# Patient Record
Sex: Female | Born: 1950
Health system: Southern US, Community
[De-identification: ages and names within clinical notes are randomized; demographics above are authoritative.]

## PROBLEM LIST (undated history)

## (undated) DIAGNOSIS — E785 Hyperlipidemia, unspecified: Secondary | ICD-10-CM

## (undated) DIAGNOSIS — M5412 Radiculopathy, cervical region: Secondary | ICD-10-CM

## (undated) DIAGNOSIS — M159 Polyosteoarthritis, unspecified: Secondary | ICD-10-CM

## (undated) DIAGNOSIS — J189 Pneumonia, unspecified organism: Secondary | ICD-10-CM

## (undated) DIAGNOSIS — R7303 Prediabetes: Secondary | ICD-10-CM

## (undated) DIAGNOSIS — C50919 Malignant neoplasm of unspecified site of unspecified female breast: Secondary | ICD-10-CM

## (undated) DIAGNOSIS — M7712 Lateral epicondylitis, left elbow: Secondary | ICD-10-CM

## (undated) DIAGNOSIS — M542 Cervicalgia: Secondary | ICD-10-CM

## (undated) DIAGNOSIS — M25562 Pain in left knee: Secondary | ICD-10-CM

## (undated) DIAGNOSIS — C55 Malignant neoplasm of uterus, part unspecified: Secondary | ICD-10-CM

## (undated) DIAGNOSIS — I10 Essential (primary) hypertension: Secondary | ICD-10-CM

## (undated) DIAGNOSIS — Z780 Asymptomatic menopausal state: Secondary | ICD-10-CM

## (undated) DIAGNOSIS — M5430 Sciatica, unspecified side: Secondary | ICD-10-CM

## (undated) DIAGNOSIS — Z809 Family history of malignant neoplasm, unspecified: Secondary | ICD-10-CM

## (undated) DIAGNOSIS — R5383 Other fatigue: Secondary | ICD-10-CM

## (undated) DIAGNOSIS — Z801 Family history of malignant neoplasm of trachea, bronchus and lung: Secondary | ICD-10-CM

## (undated) DIAGNOSIS — M25462 Effusion, left knee: Secondary | ICD-10-CM

## (undated) HISTORY — DX: Family history of malignant neoplasm, unspecified: Z80.9

## (undated) HISTORY — DX: Pain in left knee: M25.562

## (undated) HISTORY — DX: Essential (primary) hypertension: I10

## (undated) HISTORY — DX: Lateral epicondylitis, left elbow: M77.12

## (undated) HISTORY — DX: Polyosteoarthritis, unspecified: M15.9

## (undated) HISTORY — DX: Cervicalgia: M54.2

## (undated) HISTORY — PX: APPENDECTOMY: SHX54

## (undated) HISTORY — DX: Other fatigue: R53.83

## (undated) HISTORY — DX: Sciatica, unspecified side: M54.30

## (undated) HISTORY — DX: Malignant neoplasm of unspecified site of unspecified female breast: C50.919

## (undated) HISTORY — DX: Asymptomatic menopausal state: Z78.0

## (undated) HISTORY — DX: Pneumonia, unspecified organism: J18.9

## (undated) HISTORY — DX: Effusion, left knee: M25.462

## (undated) HISTORY — DX: Prediabetes: R73.03

## (undated) HISTORY — DX: Family history of malignant neoplasm of trachea, bronchus and lung: Z80.1

## (undated) HISTORY — DX: Radiculopathy, cervical region: M54.12

## (undated) HISTORY — DX: Hyperlipidemia, unspecified: E78.5

## (undated) HISTORY — DX: Malignant neoplasm of uterus, part unspecified: C55

---

## 1981-10-19 DIAGNOSIS — C569 Malignant neoplasm of unspecified ovary: Secondary | ICD-10-CM

## 1981-10-19 HISTORY — PX: ABDOMINAL HYSTERECTOMY: SHX81

## 1981-10-19 HISTORY — PX: VAGINAL HYSTERECTOMY: SUR661

## 1981-10-19 HISTORY — DX: Malignant neoplasm of unspecified ovary: C56.9

## 2015-02-25 LAB — HM PAP SMEAR: HM PAP: NEGATIVE

## 2015-04-04 LAB — BASIC METABOLIC PANEL
BUN: 16 (ref 4–21)
CREATININE: 0.8 (ref 0.5–1.1)
GLUCOSE: 85
Potassium: 4.3 (ref 3.4–5.3)
Sodium: 140 (ref 137–147)

## 2015-04-04 LAB — HEPATIC FUNCTION PANEL
ALT: 12 (ref 7–35)
AST: 15 (ref 13–35)
Alkaline Phosphatase: 66 (ref 25–125)
Bilirubin, Total: 0.4

## 2015-04-04 LAB — HM DEXA SCAN

## 2015-04-04 LAB — TSH: TSH: 1.21 (ref 0.41–5.90)

## 2015-04-04 LAB — CBC AND DIFFERENTIAL
HCT: 38 (ref 36–46)
Hemoglobin: 12.6 (ref 12.0–16.0)
PLATELETS: 259 (ref 150–399)
WBC: 5.6

## 2015-04-25 LAB — HM MAMMOGRAPHY

## 2015-04-26 ENCOUNTER — Other Ambulatory Visit: Payer: Self-pay | Admitting: Obstetrics & Gynecology

## 2015-04-26 DIAGNOSIS — R928 Other abnormal and inconclusive findings on diagnostic imaging of breast: Secondary | ICD-10-CM

## 2015-05-02 ENCOUNTER — Ambulatory Visit
Admission: RE | Admit: 2015-05-02 | Discharge: 2015-05-02 | Disposition: A | Discharge status: Home / Self Care | Payer: Medicare Other | Source: Ambulatory Visit | Attending: Obstetrics & Gynecology | Admitting: Obstetrics & Gynecology

## 2015-05-02 DIAGNOSIS — R928 Other abnormal and inconclusive findings on diagnostic imaging of breast: Secondary | ICD-10-CM

## 2015-05-02 LAB — HM MAMMOGRAPHY

## 2016-01-14 ENCOUNTER — Encounter: Payer: Self-pay | Admitting: Nurse Practitioner

## 2016-01-14 DIAGNOSIS — M25462 Effusion, left knee: Secondary | ICD-10-CM | POA: Diagnosis not present

## 2016-01-14 DIAGNOSIS — M7989 Other specified soft tissue disorders: Secondary | ICD-10-CM | POA: Diagnosis not present

## 2016-01-14 DIAGNOSIS — S8992XA Unspecified injury of left lower leg, initial encounter: Secondary | ICD-10-CM | POA: Diagnosis not present

## 2016-01-30 DIAGNOSIS — R5383 Other fatigue: Secondary | ICD-10-CM | POA: Diagnosis not present

## 2016-01-30 DIAGNOSIS — M25462 Effusion, left knee: Secondary | ICD-10-CM | POA: Diagnosis not present

## 2016-03-19 DIAGNOSIS — M25462 Effusion, left knee: Secondary | ICD-10-CM | POA: Diagnosis not present

## 2016-03-19 DIAGNOSIS — M25562 Pain in left knee: Secondary | ICD-10-CM | POA: Diagnosis not present

## 2016-09-16 DIAGNOSIS — M159 Polyosteoarthritis, unspecified: Secondary | ICD-10-CM | POA: Diagnosis not present

## 2016-09-16 DIAGNOSIS — Z780 Asymptomatic menopausal state: Secondary | ICD-10-CM | POA: Diagnosis not present

## 2016-09-16 DIAGNOSIS — M542 Cervicalgia: Secondary | ICD-10-CM | POA: Diagnosis not present

## 2016-09-16 DIAGNOSIS — R5383 Other fatigue: Secondary | ICD-10-CM | POA: Diagnosis not present

## 2016-10-14 DIAGNOSIS — R5383 Other fatigue: Secondary | ICD-10-CM | POA: Diagnosis not present

## 2016-10-14 DIAGNOSIS — M159 Polyosteoarthritis, unspecified: Secondary | ICD-10-CM | POA: Diagnosis not present

## 2016-10-14 LAB — MICROALBUMIN, URINE: Microalb, Ur: 6.4

## 2016-10-14 LAB — BASIC METABOLIC PANEL
BUN: 16 (ref 4–21)
Creatinine: 0.7 (ref 0.5–1.1)
GLUCOSE: 93
Potassium: 4.8 (ref 3.4–5.3)
Sodium: 141 (ref 137–147)

## 2016-10-14 LAB — HEPATIC FUNCTION PANEL
ALT: 18 (ref 7–35)
AST: 14 (ref 13–35)
Alkaline Phosphatase: 82 (ref 25–125)
BILIRUBIN, TOTAL: 0.2

## 2016-10-14 LAB — LIPID PANEL
CHOLESTEROL: 339 — AB (ref 0–200)
HDL: 55 (ref 35–70)
LDL Cholesterol: 244
TRIGLYCERIDES: 201 — AB (ref 40–160)

## 2016-10-14 LAB — CBC AND DIFFERENTIAL
HEMATOCRIT: 38 (ref 36–46)
HEMOGLOBIN: 12.2 (ref 12.0–16.0)
PLATELETS: 311 (ref 150–399)
WBC: 7.6

## 2016-10-14 LAB — TSH: TSH: 1.34 (ref 0.41–5.90)

## 2016-10-21 ENCOUNTER — Encounter: Payer: Self-pay | Admitting: Nurse Practitioner

## 2016-10-21 DIAGNOSIS — E782 Mixed hyperlipidemia: Secondary | ICD-10-CM | POA: Diagnosis not present

## 2016-10-21 DIAGNOSIS — M5412 Radiculopathy, cervical region: Secondary | ICD-10-CM | POA: Diagnosis not present

## 2016-10-21 DIAGNOSIS — M542 Cervicalgia: Secondary | ICD-10-CM | POA: Diagnosis not present

## 2016-10-21 DIAGNOSIS — R7303 Prediabetes: Secondary | ICD-10-CM | POA: Diagnosis not present

## 2016-11-18 DIAGNOSIS — H524 Presbyopia: Secondary | ICD-10-CM | POA: Diagnosis not present

## 2016-11-18 DIAGNOSIS — Z0100 Encounter for examination of eyes and vision without abnormal findings: Secondary | ICD-10-CM | POA: Diagnosis not present

## 2017-01-19 ENCOUNTER — Encounter: Payer: Self-pay | Admitting: Nurse Practitioner

## 2017-01-19 DIAGNOSIS — E782 Mixed hyperlipidemia: Secondary | ICD-10-CM | POA: Diagnosis not present

## 2017-01-19 DIAGNOSIS — R7303 Prediabetes: Secondary | ICD-10-CM | POA: Diagnosis not present

## 2017-01-19 LAB — HEPATIC FUNCTION PANEL
ALK PHOS: 72 (ref 25–125)
ALT: 17 (ref 7–35)
ALT: 17 (ref 7–35)
AST: 16 (ref 13–35)
AST: 16 (ref 13–35)
Alkaline Phosphatase: 72 (ref 25–125)
Bilirubin, Total: 0.4
Bilirubin, Total: 0.4

## 2017-01-19 LAB — LIPID PANEL
CHOLESTEROL: 307 — AB (ref 0–200)
CHOLESTEROL: 307 — AB (ref 0–200)
HDL: 52 (ref 35–70)
HDL: 52 (ref 35–70)
LDL CALC: 217
LDL Cholesterol: 217
TRIGLYCERIDES: 189 — AB (ref 40–160)
TRIGLYCERIDES: 189 — AB (ref 40–160)

## 2017-01-19 LAB — BASIC METABOLIC PANEL
BUN: 17 (ref 4–21)
BUN: 17 (ref 4–21)
CREATININE: 0.8 (ref 0.5–1.1)
Creatinine: 0.8 (ref 0.5–1.1)
Glucose: 102
Glucose: 102
POTASSIUM: 4.3 (ref 3.4–5.3)
Potassium: 4.3 (ref 3.4–5.3)
SODIUM: 141 (ref 137–147)
Sodium: 141 (ref 137–147)

## 2017-01-19 LAB — HEMOGLOBIN A1C
HEMOGLOBIN A1C: 6.6
HEMOGLOBIN A1C: 6.6

## 2017-06-28 DIAGNOSIS — R7303 Prediabetes: Secondary | ICD-10-CM | POA: Diagnosis not present

## 2017-06-28 DIAGNOSIS — M7712 Lateral epicondylitis, left elbow: Secondary | ICD-10-CM

## 2017-06-28 DIAGNOSIS — E782 Mixed hyperlipidemia: Secondary | ICD-10-CM | POA: Diagnosis not present

## 2017-06-28 HISTORY — DX: Lateral epicondylitis, left elbow: M77.12

## 2017-09-13 DIAGNOSIS — J069 Acute upper respiratory infection, unspecified: Secondary | ICD-10-CM | POA: Diagnosis not present

## 2017-09-15 DIAGNOSIS — J36 Peritonsillar abscess: Secondary | ICD-10-CM | POA: Diagnosis not present

## 2017-09-15 HISTORY — PX: ABCESS DRAINAGE: SHX399

## 2017-09-16 DIAGNOSIS — J36 Peritonsillar abscess: Secondary | ICD-10-CM | POA: Diagnosis not present

## 2017-12-20 ENCOUNTER — Ambulatory Visit: Payer: Self-pay | Admitting: Nurse Practitioner

## 2017-12-27 ENCOUNTER — Ambulatory Visit: Payer: Medicare HMO | Admitting: Nurse Practitioner

## 2017-12-28 ENCOUNTER — Encounter: Payer: Self-pay | Admitting: Nurse Practitioner

## 2017-12-28 ENCOUNTER — Ambulatory Visit (INDEPENDENT_AMBULATORY_CARE_PROVIDER_SITE_OTHER): Payer: Medicare HMO | Admitting: Nurse Practitioner

## 2017-12-28 VITALS — BP 126/84 | HR 76 | Temp 98.5°F | Ht 67.0 in | Wt 139.4 lb

## 2017-12-28 DIAGNOSIS — R739 Hyperglycemia, unspecified: Secondary | ICD-10-CM

## 2017-12-28 DIAGNOSIS — J309 Allergic rhinitis, unspecified: Secondary | ICD-10-CM

## 2017-12-28 DIAGNOSIS — M8580 Other specified disorders of bone density and structure, unspecified site: Secondary | ICD-10-CM | POA: Diagnosis not present

## 2017-12-28 DIAGNOSIS — M159 Polyosteoarthritis, unspecified: Secondary | ICD-10-CM | POA: Diagnosis not present

## 2017-12-28 DIAGNOSIS — E782 Mixed hyperlipidemia: Secondary | ICD-10-CM

## 2017-12-28 MED ORDER — TETANUS-DIPHTH-ACELL PERTUSSIS 5-2.5-18.5 LF-MCG/0.5 IM SUSP: 0.5000 mL | Freq: Once | INTRAMUSCULAR | 0 refills | Status: AC
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
## 2017-12-28 NOTE — Patient Instructions (Signed)
4-6 weeks for AWV with fasting blood work then physical

## 2017-12-30 ENCOUNTER — Ambulatory Visit (INDEPENDENT_AMBULATORY_CARE_PROVIDER_SITE_OTHER): Payer: Medicare HMO | Admitting: Nurse Practitioner

## 2017-12-30 ENCOUNTER — Encounter: Payer: Self-pay | Admitting: Nurse Practitioner

## 2017-12-30 VITALS — BP 118/78 | HR 80 | Temp 98.7°F | Ht 67.0 in | Wt 138.0 lb

## 2017-12-30 DIAGNOSIS — J0140 Acute pansinusitis, unspecified: Secondary | ICD-10-CM | POA: Diagnosis not present

## 2017-12-30 DIAGNOSIS — E782 Mixed hyperlipidemia: Secondary | ICD-10-CM | POA: Insufficient documentation

## 2017-12-30 DIAGNOSIS — M159 Polyosteoarthritis, unspecified: Secondary | ICD-10-CM | POA: Insufficient documentation

## 2017-12-30 DIAGNOSIS — J309 Allergic rhinitis, unspecified: Secondary | ICD-10-CM | POA: Insufficient documentation

## 2017-12-30 DIAGNOSIS — R739 Hyperglycemia, unspecified: Secondary | ICD-10-CM | POA: Insufficient documentation

## 2017-12-30 DIAGNOSIS — M8580 Other specified disorders of bone density and structure, unspecified site: Secondary | ICD-10-CM | POA: Insufficient documentation

## 2017-12-30 MED ORDER — FLUTICASONE PROPIONATE 50 MCG/ACT NA SUSP: 1.0000 | Freq: Two times a day (BID) | NASAL | 0 refills | Status: DC

## 2017-12-30 NOTE — Patient Instructions (Signed)
neti pot twice daily flonase 1 spray into nares twice daily  Plain nasal saline spray throughout the day as needed May use tylenol 325 mg 2 tablets every 6 hours as needed aches and pains or sore throat humidifier in the home to help with the dry air Mucinex DM by mouth twice daily as needed for cough and congestion with full glass of water  Keep well hydrated Avoid forcefully blowing nose     Sinusitis, Adult Sinusitis is soreness and inflammation of your sinuses. Sinuses are hollow spaces in the bones around your face. Your sinuses are located:  Around your eyes.  In the middle of your forehead.  Behind your nose.  In your cheekbones.  Your sinuses and nasal passages are lined with a stringy fluid (mucus). Mucus normally drains out of your sinuses. When your nasal tissues become inflamed or swollen, the mucus can become trapped or blocked so air cannot flow through your sinuses. This allows bacteria, viruses, and funguses to grow, which leads to infection. Sinusitis can develop quickly and last for 7?10 days (acute) or for more than 12 weeks (chronic). Sinusitis often develops after a cold. What are the causes? This condition is caused by anything that creates swelling in the sinuses or stops mucus from draining, including:  Allergies.  Asthma.  Bacterial or viral infection.  Abnormally shaped bones between the nasal passages.  Nasal growths that contain mucus (nasal polyps).  Narrow sinus openings.  Pollutants, such as chemicals or irritants in the air.  A foreign object stuck in the nose.  A fungal infection. This is rare.  What increases the risk? The following factors may make you more likely to develop this condition:  Having allergies or asthma.  Having had a recent cold or respiratory tract infection.  Having structural deformities or blockages in your nose or sinuses.  Having a weak immune system.  Doing a lot of swimming or diving.  Overusing  nasal sprays.  Smoking.  What are the signs or symptoms? The main symptoms of this condition are pain and a feeling of pressure around the affected sinuses. Other symptoms include:  Upper toothache.  Earache.  Headache.  Bad breath.  Decreased sense of smell and taste.  A cough that may get worse at night.  Fatigue.  Fever.  Thick drainage from your nose. The drainage is often green and it may contain pus (purulent).  Stuffy nose or congestion.  Postnasal drip. This is when extra mucus collects in the throat or back of the nose.  Swelling and warmth over the affected sinuses.  Sore throat.  Sensitivity to light.  How is this diagnosed? This condition is diagnosed based on symptoms, a medical history, and a physical exam. To find out if your condition is acute or chronic, your health care provider may:  Look in your nose for signs of nasal polyps.  Tap over the affected sinus to check for signs of infection.  View the inside of your sinuses using an imaging device that has a light attached (endoscope).  If your health care provider suspects that you have chronic sinusitis, you may also:  Be tested for allergies.  Have a sample of mucus taken from your nose (nasal culture) and checked for bacteria.  Have a mucus sample examined to see if your sinusitis is related to an allergy.  If your sinusitis does not respond to treatment and it lasts longer than 8 weeks, you may have an MRI or CT scan to check  your sinuses. These scans also help to determine how severe your infection is. In rare cases, a bone biopsy may be done to rule out more serious types of fungal sinus disease. How is this treated? Treatment for sinusitis depends on the cause and whether your condition is chronic or acute. If a virus is causing your sinusitis, your symptoms will go away on their own within 10 days. You may be given medicines to relieve your symptoms, including:  Topical nasal  decongestants. They shrink swollen nasal passages and let mucus drain from your sinuses.  Antihistamines. These drugs block inflammation that is triggered by allergies. This can help to ease swelling in your nose and sinuses.  Topical nasal corticosteroids. These are nasal sprays that ease inflammation and swelling in your nose and sinuses.  Nasal saline washes. These rinses can help to get rid of thick mucus in your nose.  If your condition is caused by bacteria, you will be given an antibiotic medicine. If your condition is caused by a fungus, you will be given an antifungal medicine. Surgery may be needed to correct underlying conditions, such as narrow nasal passages. Surgery may also be needed to remove polyps. Follow these instructions at home: Medicines  Take, use, or apply over-the-counter and prescription medicines only as told by your health care provider. These may include nasal sprays.  If you were prescribed an antibiotic medicine, take it as told by your health care provider. Do not stop taking the antibiotic even if you start to feel better. Hydrate and Humidify  Drink enough water to keep your urine clear or pale yellow. Staying hydrated will help to thin your mucus.  Use a cool mist humidifier to keep the humidity level in your home above 50%.  Inhale steam for 10-15 minutes, 3-4 times a day or as told by your health care provider. You can do this in the bathroom while a hot shower is running.  Limit your exposure to cool or dry air. Rest  Rest as much as possible.  Sleep with your head raised (elevated).  Make sure to get enough sleep each night. General instructions  Apply a warm, moist washcloth to your face 3-4 times a day or as told by your health care provider. This will help with discomfort.  Wash your hands often with soap and water to reduce your exposure to viruses and other germs. If soap and water are not available, use hand sanitizer.  Do not smoke.  Avoid being around people who are smoking (secondhand smoke).  Keep all follow-up visits as told by your health care provider. This is important. Contact a health care provider if:  You have a fever.  Your symptoms get worse.  Your symptoms do not improve within 10 days. Get help right away if:  You have a severe headache.  You have persistent vomiting.  You have pain or swelling around your face or eyes.  You have vision problems.  You develop confusion.  Your neck is stiff.  You have trouble breathing. This information is not intended to replace advice given to you by your health care provider. Make sure you discuss any questions you have with your health care provider. Document Released: 10/05/2005 Document Revised: 05/31/2016 Document Reviewed: 07/31/2015 Elsevier Interactive Patient Education  [employer].

## 2017-12-30 NOTE — Progress Notes (Signed)
Careteam: Patient Care Team: [name], NP as PCP - General (Geriatric Medicine)  Advanced Directive information    No Known Allergies  Chief Complaint  Patient presents with  . Acute Visit    Pt is being seen for sinus pressure/pain/drainage and a cough for 2 days.      HPI: Patient is a 67 y.o. female seen in the office today due to cough and congestion for 2 days. Sinuses are swollen lots of dripping from her nose.  More in her head then anywhere else. Lots of head congestion -no fever or chills, just feeling sick Occasional cough.  Mild sore throat.  Using alka-seltzer cold plus and drank tea   Review of Systems:  Review of Systems  Constitutional: Positive for malaise/fatigue. Negative for chills and fever.  HENT: Positive for congestion, sinus pain and sore throat. Negative for ear discharge and ear pain.   Respiratory: Positive for cough. Negative for sputum production and wheezing.   Cardiovascular: Negative for chest pain.  Neurological: Negative for weakness.    Past Medical History:  Diagnosis Date  . Fatigue   . Generalized osteoarthrosis   . Hyperlipidemia   . Menopause   . Pre-diabetes    Past Surgical History:  Procedure Laterality Date  . VAGINAL HYSTERECTOMY  1983   Social History:   reports that she has been smoking cigarettes.  She has a 25.00 pack-year smoking history. she has never used smokeless tobacco. She reports that she does not drink alcohol or use drugs.  Family History  Problem Relation Age of Onset  . Stroke Mother   . Diabetes Mother   . Dementia Mother   . Cancer Sister        unknown  . Cancer Sister        unknown  . Kidney failure Brother   . Cancer Brother   . Cancer Brother     Medications: Patient's Medications  New Prescriptions   No medications on file  Previous Medications   CALCIUM CITRATE-VITAMIN D (CALCIUM CITRATE + D PO)    Take 1 tablet by mouth daily.   DIGESTIVE ENZYMES (SUPER ENZYMES)  TABS    Take 1 tablet by mouth daily.   DIPHENHYDRAMINE (BENADRYL) 25 MG TABLET    Take 25 mg by mouth daily.   IBUPROFEN (ADVIL,MOTRIN) 200 MG TABLET    Take 200 mg by mouth daily as needed.   VITAMIN E 400 UNIT CAPSULE    Take 400 Units by mouth 2 (two) times daily.  Modified Medications   No medications on file  Discontinued Medications   No medications on file     Physical Exam:  Vitals:   12/30/17 0947  BP: 118/78  Pulse: 80  Temp: 98.7 F (37.1 C)  TempSrc: Oral  SpO2: 99%  Weight: 138 lb (62.6 kg)  Height: 5\' 7"  (1.702 m)   Body mass index is 21.61 kg/m.  Physical Exam  Constitutional: She is oriented to person, place, and time. She appears well-developed and well-nourished.  HENT:  Head: Normocephalic and atraumatic.  Right Ear: External ear normal.  Left Ear: External ear normal.  Nose: Mucosal edema and rhinorrhea present. Right sinus exhibits maxillary sinus tenderness and frontal sinus tenderness.  Mouth/Throat: Oropharynx is clear and moist.  Cardiovascular: Normal rate and regular rhythm.  Pulmonary/Chest: Effort normal and breath sounds normal.  Neurological: She is alert and oriented to person, place, and time.  Skin: Skin is warm and dry.  Labs reviewed: Basic Metabolic Panel: Recent Labs    01/19/17  NA 141  K 4.3  BUN 17  CREATININE 0.8   Liver Function Tests: Recent Labs    01/19/17  AST 16  ALT 17  ALKPHOS 72   No results for input(s): LIPASE, AMYLASE in the last 8760 hours. No results for input(s): AMMONIA in the last 8760 hours. CBC: No results for input(s): WBC, NEUTROABS, HGB, HCT, MCV, PLT in the last 8760 hours. Lipid Panel: Recent Labs    01/19/17  CHOL 307*  HDL 52  LDLCALC 217  TRIG 189*   TSH: No results for input(s): TSH in the last 8760 hours. A1C: Lab Results  Component Value Date   HGBA1C 6.6 01/19/2017     Assessment/Plan 1. Acute non-recurrent pansinusitis -supportive care encouraged.  neti pot  twice daily Plain nasal saline spray throughout the day as needed May use tylenol 325 mg 2 tablets every 6 hours as needed aches and pains or sore throat humidifier in the home to help with the dry air Mucinex DM by mouth twice daily as needed for cough and congestion with full glass of water  Keep well hydrated Avoid forcefully blowing nose - fluticasone (FLONASE) 50 MCG/ACT nasal spray; Place 1 spray into both nostrils 2 (two) times daily.  Dispense: 16 g; Refill: 0  Next appt:  Otilia Kareem K. [place], [facility] [phone]

## 2018-01-05 ENCOUNTER — Ambulatory Visit (INDEPENDENT_AMBULATORY_CARE_PROVIDER_SITE_OTHER): Payer: Medicare HMO

## 2018-01-05 VITALS — BP 128/70 | HR 76 | Temp 98.6°F | Ht 67.0 in | Wt 139.0 lb

## 2018-01-05 DIAGNOSIS — E782 Mixed hyperlipidemia: Secondary | ICD-10-CM

## 2018-01-05 DIAGNOSIS — E2839 Other primary ovarian failure: Secondary | ICD-10-CM | POA: Diagnosis not present

## 2018-01-05 DIAGNOSIS — Z0000 Encounter for general adult medical examination without abnormal findings: Secondary | ICD-10-CM | POA: Diagnosis not present

## 2018-01-05 DIAGNOSIS — M159 Polyosteoarthritis, unspecified: Secondary | ICD-10-CM | POA: Diagnosis not present

## 2018-01-05 DIAGNOSIS — Z1231 Encounter for screening mammogram for malignant neoplasm of breast: Secondary | ICD-10-CM

## 2018-01-05 DIAGNOSIS — R739 Hyperglycemia, unspecified: Secondary | ICD-10-CM | POA: Diagnosis not present

## 2018-01-05 DIAGNOSIS — Z1239 Encounter for other screening for malignant neoplasm of breast: Secondary | ICD-10-CM

## 2018-01-05 NOTE — Patient Instructions (Signed)
Emily Hayes , Thank you for taking time to come for your Medicare Wellness Visit. I appreciate your ongoing commitment to your health goals. Please review the following plan we discussed and let me know if I can assist you in the future.   Screening recommendations/referrals: Colonoscopy up to date, due 10/19/2022 Mammogram due, ordered Bone Density due, ordered Recommended yearly ophthalmology/optometry visit for glaucoma screening and checkup Recommended yearly dental visit for hygiene and checkup  Vaccinations: Influenza vaccine due, declined Pneumococcal vaccine due, declined Tdap vaccine due, you have the prescription  Shingles vaccine up to date, completed    Advanced directives: Advance directive discussed with you today. I have provided a copy for you to complete at home and have notarized. Once this is complete please bring a copy in to our office so we can scan it into your chart.  Conditions/risks identified: none  Next appointment: Emily Mustache, NP 01/12/2018 @ 1:30pm             Emily Dense, RN 01/09/2019 @ 8:30am     Preventive Care 65 Years and Older, Female Preventive care refers to lifestyle choices and visits with your health care provider that can promote health and wellness. What does preventive care include?  A yearly physical exam. This is also called an annual well check.  Dental exams once or twice a year.  Routine eye exams. Ask your health care provider how often you should have your eyes checked.  Personal lifestyle choices, including:  Daily care of your teeth and gums.  Regular physical activity.  Eating a healthy diet.  Avoiding tobacco and drug use.  Limiting alcohol use.  Practicing safe sex.  Taking low-dose aspirin every day.  Taking vitamin and mineral supplements as recommended by your health care provider. What happens during an annual well check? The services and screenings done by your health care provider during your annual  well check will depend on your age, overall health, lifestyle risk factors, and family history of disease. Counseling  Your health care provider may ask you questions about your:  Alcohol use.  Tobacco use.  Drug use.  Emotional well-being.  Home and relationship well-being.  Sexual activity.  Eating habits.  History of falls.  Memory and ability to understand (cognition).  Work and work Statistician.  Reproductive health. Screening  You may have the following tests or measurements:  Height, weight, and BMI.  Blood pressure.  Lipid and cholesterol levels. These may be checked every 5 years, or more frequently if you are over 36 years old.  Skin check.  Lung cancer screening. You may have this screening every year starting at age 88 if you have a 30-pack-year history of smoking and currently smoke or have quit within the past 15 years.  Fecal occult blood test (FOBT) of the stool. You may have this test every year starting at age 68.  Flexible sigmoidoscopy or colonoscopy. You may have a sigmoidoscopy every 5 years or a colonoscopy every 10 years starting at age 76.  Hepatitis C blood test.  Hepatitis B blood test.  Sexually transmitted disease (STD) testing.  Diabetes screening. This is done by checking your blood sugar (glucose) after you have not eaten for a while (fasting). You may have this done every 1-3 years.  Bone density scan. This is done to screen for osteoporosis. You may have this done starting at age 90.  Mammogram. This may be done every 1-2 years. Talk to your health care provider about how often  you should have regular mammograms. Talk with your health care provider about your test results, treatment options, and if necessary, the need for more tests. Vaccines  Your health care provider may recommend certain vaccines, such as:  Influenza vaccine. This is recommended every year.  Tetanus, diphtheria, and acellular pertussis (Tdap, Td) vaccine.  You may need a Td booster every 10 years.  Zoster vaccine. You may need this after age 33.  Pneumococcal 13-valent conjugate (PCV13) vaccine. One dose is recommended after age 13.  Pneumococcal polysaccharide (PPSV23) vaccine. One dose is recommended after age 61. Talk to your health care provider about which screenings and vaccines you need and how often you need them. This information is not intended to replace advice given to you by your health care provider. Make sure you discuss any questions you have with your health care provider. Document Released: 11/01/2015 Document Revised: 06/24/2016 Document Reviewed: 08/06/2015 Elsevier Interactive Patient Education  2017 [employer] Prevention in the Home Falls can cause injuries. They can happen to people of all ages. There are many things you can do to make your home safe and to help prevent falls. What can I do on the outside of my home?  Regularly fix the edges of walkways and driveways and fix any cracks.  Remove anything that might make you trip as you walk through a door, such as a raised step or threshold.  Trim any bushes or trees on the path to your home.  Use bright outdoor lighting.  Clear any walking paths of anything that might make someone trip, such as rocks or tools.  Regularly check to see if handrails are loose or broken. Make sure that both sides of any steps have handrails.  Any raised decks and porches should have guardrails on the edges.  Have any leaves, snow, or ice cleared regularly.  Use sand or salt on walking paths during winter.  Clean up any spills in your garage right away. This includes oil or grease spills. What can I do in the bathroom?  Use night lights.  Install grab bars by the toilet and in the tub and shower. Do not use towel bars as grab bars.  Use non-skid mats or decals in the tub or shower.  If you need to sit down in the shower, use a plastic, non-slip stool.  Keep the  floor dry. Clean up any water that spills on the floor as soon as it happens.  Remove soap buildup in the tub or shower regularly.  Attach bath mats securely with double-sided non-slip rug tape.  Do not have throw rugs and other things on the floor that can make you trip. What can I do in the bedroom?  Use night lights.  Make sure that you have a light by your bed that is easy to reach.  Do not use any sheets or blankets that are too big for your bed. They should not hang down onto the floor.  Have a firm chair that has side arms. You can use this for support while you get dressed.  Do not have throw rugs and other things on the floor that can make you trip. What can I do in the kitchen?  Clean up any spills right away.  Avoid walking on wet floors.  Keep items that you use a lot in easy-to-reach places.  If you need to reach something above you, use a strong step stool that has a grab bar.  Keep electrical cords  out of the way.  Do not use floor polish or wax that makes floors slippery. If you must use wax, use non-skid floor wax.  Do not have throw rugs and other things on the floor that can make you trip. What can I do with my stairs?  Do not leave any items on the stairs.  Make sure that there are handrails on both sides of the stairs and use them. Fix handrails that are broken or loose. Make sure that handrails are as long as the stairways.  Check any carpeting to make sure that it is firmly attached to the stairs. Fix any carpet that is loose or worn.  Avoid having throw rugs at the top or bottom of the stairs. If you do have throw rugs, attach them to the floor with carpet tape.  Make sure that you have a light switch at the top of the stairs and the bottom of the stairs. If you do not have them, ask someone to add them for you. What else can I do to help prevent falls?  Wear shoes that:  Do not have high heels.  Have rubber bottoms.  Are comfortable and fit  you well.  Are closed at the toe. Do not wear sandals.  If you use a stepladder:  Make sure that it is fully opened. Do not climb a closed stepladder.  Make sure that both sides of the stepladder are locked into place.  Ask someone to hold it for you, if possible.  Clearly mark and make sure that you can see:  Any grab bars or handrails.  First and last steps.  Where the edge of each step is.  Use tools that help you move around (mobility aids) if they are needed. These include:  Canes.  Walkers.  Scooters.  Crutches.  Turn on the lights when you go into a dark area. Replace any light bulbs as soon as they burn out.  Set up your furniture so you have a clear path. Avoid moving your furniture around.  If any of your floors are uneven, fix them.  If there are any pets around you, be aware of where they are.  Review your medicines with your doctor. Some medicines can make you feel dizzy. This can increase your chance of falling. Ask your doctor what other things that you can do to help prevent falls. This information is not intended to replace advice given to you by your health care provider. Make sure you discuss any questions you have with your health care provider. Document Released: 08/01/2009 Document Revised: 03/12/2016 Document Reviewed: 11/09/2014 Elsevier Interactive Patient Education  2017 [employer].

## 2018-01-05 NOTE — Progress Notes (Signed)
Subjective:   Emily Hayes is a 67 y.o. female who presents for an Initial Medicare Annual Wellness Visit.    Objective:    Today's Vitals   01/05/18 0830  BP: 128/70  Pulse: 76  Temp: 98.6 F (37 C)  TempSrc: Oral  SpO2: 97%  Weight: 139 lb (63 kg)  Height: 5\' 7"  (1.702 m)   Body mass index is 21.77 kg/m.  Advanced Directives 01/05/2018  Does Patient Have a Medical Advance Directive? No  Would patient like information on creating a medical advance directive? Yes (MAU/Ambulatory/Procedural Areas - Information given)    Current Medications (verified) Outpatient Encounter Medications as of 01/05/2018  Medication Sig  . Calcium Citrate-Vitamin D (CALCIUM CITRATE + D PO) Take 1 tablet by mouth daily.  . Digestive Enzymes (SUPER ENZYMES) TABS Take 1 tablet by mouth daily.  . diphenhydrAMINE (BENADRYL) 25 MG tablet Take 25 mg by mouth daily.  . fluticasone (FLONASE) 50 MCG/ACT nasal spray Place 1 spray into both nostrils 2 (two) times daily.  Marland Kitchen ibuprofen (ADVIL,MOTRIN) 200 MG tablet Take 200 mg by mouth daily as needed.  . vitamin E 400 UNIT capsule Take 400 Units by mouth 2 (two) times daily.   No facility-administered encounter medications on file as of 01/05/2018.     Allergies (verified) Patient has no known allergies.   History: Past Medical History:  Diagnosis Date  . Fatigue   . Generalized osteoarthrosis   . Hyperlipidemia   . Menopause   . Pre-diabetes    Past Surgical History:  Procedure Laterality Date  . ABCESS DRAINAGE Left 09/15/2017   behind tonsil  . VAGINAL HYSTERECTOMY  1983   Family History  Problem Relation Age of Onset  . Stroke Mother   . Diabetes Mother   . Dementia Mother   . Cancer Sister        unknown  . Cancer Sister        unknown  . Kidney failure Brother   . Cancer Brother   . Cancer Brother    Social History   Socioeconomic History  . Marital status: Divorced    Spouse name: None  . Number of children: None  .  Years of education: None  . Highest education level: None  Social Needs  . Financial resource strain: Not hard at all  . Food insecurity - worry: Never true  . Food insecurity - inability: Never true  . Transportation needs - medical: No  . Transportation needs - non-medical: No  Occupational History  . None  Tobacco Use  . Smoking status: Current Every Day Smoker    Packs/day: 0.75    Years: 50.00    Pack years: 37.50    Types: Cigarettes  . Smokeless tobacco: Never Used  Substance and Sexual Activity  . Alcohol use: No    Frequency: Never  . Drug use: No  . Sexual activity: None  Other Topics Concern  . None  Social History Narrative   Social History      Diet? none      Do you drink/eat things with caffeine? Chocolate- occasionally      Marital status?        divorced                            What year were you married? 1982      Do you live in a house, apartment, assisted living, condo, trailer, etc.? apartment  Is it one or more stories? 1      How many persons live in your home? 1       Do you have any pets in your home? (please list) yes, dog      Highest level of education completed? Associate degree- college      Current or past profession: retired      Do you exercise?           yes                           Type & how often? Walk- daily      Advanced Directives      Do you have a living will? yes      Do you have a DNR form?     yes                             If not, do you want to discuss one? yes      Do you have signed POA/HPOA for forms? no      Functional Status      Do you have difficulty bathing or dressing yourself?      Do you have difficulty preparing food or eating?       Do you have difficulty managing your medications?      Do you have difficulty managing your finances?      Do you have difficulty affording your medications?    Tobacco Counseling Ready to quit: Not Answered Counseling given: Not  Answered   Clinical Intake:  Pre-visit preparation completed: No  Pain : No/denies pain     Nutritional Risks: None Diabetes: No  How often do you need to have someone help you when you read instructions, pamphlets, or other written materials from your doctor or pharmacy?: 1 - Never What is the last grade level you completed in school?: Associates  Interpreter Needed?: No  Information entered by :: [name], RN   Activities of Daily Living In your present state of health, do you have any difficulty performing the following activities: 01/05/2018  Hearing? N  Vision? N  Difficulty concentrating or making decisions? N  Walking or climbing stairs? N  Dressing or bathing? N  Doing errands, shopping? N  Preparing Food and eating ? N  Using the Toilet? N  In the past six months, have you accidently leaked urine? N  Do you have problems with loss of bowel control? N  Managing your Medications? N  Managing your Finances? N  Housekeeping or managing your Housekeeping? N  Some recent data might be hidden     Immunizations and Health Maintenance Immunization History  Administered Date(s) Administered  . Zoster Recombinat (Shingrix) 03/19/2017, 07/19/2017   Health Maintenance Due  Topic Date Due  . Hepatitis C Screening  09/06/1951  . TETANUS/TDAP  08/11/1968  . MAMMOGRAM  08/12/1999  . COLONOSCOPY  08/12/1999  . DEXA SCAN  08/11/2014    Patient Care Team: [name], NP as PCP - General (Geriatric Medicine)  Indicate any recent Medical Services you may have received from other than [facility] providers in the past year (date may be approximate).     Assessment:   This is a routine wellness examination for [name].  Hearing/Vision screen Hearing Screening Comments: Pt reports no issues with hearing Vision Screening Comments: Sees eye doctor annually  Dietary issues  and exercise activities discussed: Current Exercise Habits: Home exercise routine, Type of  exercise: walking, Time (Minutes): 20, Frequency (Times/Week): 3, Weekly Exercise (Minutes/Week): 60, Intensity: Mild, Exercise limited by: None identified  Goals    None     Depression Screen PHQ 2/9 Scores 01/05/2018 12/28/2017  PHQ - 2 Score 0 0    Fall Risk Fall Risk  01/05/2018 12/30/2017 12/28/2017  Falls in the past year? No No No    Is the patient's home free of loose throw rugs in walkways, pet beds, electrical cords, etc?   yes      Grab bars in the bathroom? no      Handrails on the stairs?   yes      Adequate lighting?   yes  Cognitive Function: MMSE - Mini Mental State Exam 01/05/2018  Orientation to time 5  Orientation to Place 5  Registration 3  Attention/ Calculation 5  Recall 3  Language- name 2 objects 2  Language- repeat 1  Language- follow 3 step command 3  Language- read & follow direction 1  Write a sentence 1  Copy design 1  Total score 30        Screening Tests Health Maintenance  Topic Date Due  . Hepatitis C Screening  1951-09-09  . TETANUS/TDAP  08/11/1968  . MAMMOGRAM  08/12/1999  . COLONOSCOPY  08/12/1999  . DEXA SCAN  08/11/2014  . INFLUENZA VACCINE  08/30/2018 (Originally 05/19/2017)  . PNA vac Low Risk Adult (1 of 2 - PCV13) 12/29/2018 (Originally 08/11/2014)    Qualifies for Shingles Vaccine? Up to date  Cancer Screenings: Lung: Low Dose CT Chest recommended if Age 95-80 years, 30 pack-year currently smoking OR have quit w/in 15years. Patient does not qualify. Breast: Up to date on Mammogram? No, ordered Up to date of Bone Density/Dexa? No, ordered Colorectal: up to date  Additional Screenings: Hepatitis C Screening: declined TDAP due- pt has prescription PNA vaccine due and declined Flu vaccine due and declined     Plan:    I have personally reviewed and addressed the Medicare Annual Wellness questionnaire and have noted the following in the patient's chart:  A. Medical and social history B. Use of alcohol, tobacco or  illicit drugs  C. Current medications and supplements D. Functional ability and status E.  Nutritional status F.  Physical activity G. Advance directives H. List of other physicians I.  Hospitalizations, surgeries, and ER visits in previous 12 months J.  [place] to include hearing, vision, cognitive, depression L. Referrals and appointments - none  In addition, I have reviewed and discussed with patient certain preventive protocols, quality metrics, and best practice recommendations. A written personalized care plan for preventive services as well as general preventive health recommendations were provided to patient.  See attached scanned questionnaire for additional information.   Signed,   [name], RN Nurse Health Advisor  Patient Concerns: None

## 2018-01-06 ENCOUNTER — Other Ambulatory Visit: Payer: Self-pay | Admitting: Nurse Practitioner

## 2018-01-06 ENCOUNTER — Other Ambulatory Visit: Payer: Self-pay

## 2018-01-06 LAB — COMPLETE METABOLIC PANEL WITH GFR
AG RATIO: 1.7 (calc) (ref 1.0–2.5)
ALT: 10 U/L (ref 6–29)
AST: 14 U/L (ref 10–35)
Albumin: 4.3 g/dL (ref 3.6–5.1)
Alkaline phosphatase (APISO): 62 U/L (ref 33–130)
BUN: 16 mg/dL (ref 7–25)
CO2: 31 mmol/L (ref 20–32)
CREATININE: 0.8 mg/dL (ref 0.50–0.99)
Calcium: 9.6 mg/dL (ref 8.6–10.4)
Chloride: 104 mmol/L (ref 98–110)
GFR, EST AFRICAN AMERICAN: 88 mL/min/{1.73_m2} (ref >=60)
GFR, Est Non African American: 76 mL/min/{1.73_m2} (ref >=60)
Globulin: 2.5 g/dL (calc) (ref 1.9–3.7)
Glucose, Bld: 100 mg/dL — ABNORMAL HIGH (ref 65–99)
POTASSIUM: 4.5 mmol/L (ref 3.5–5.3)
Sodium: 138 mmol/L (ref 135–146)
TOTAL PROTEIN: 6.8 g/dL (ref 6.1–8.1)
Total Bilirubin: 0.3 mg/dL (ref 0.2–1.2)

## 2018-01-06 LAB — CBC WITH DIFFERENTIAL/PLATELET
BASOS PCT: 1.1 %
Basophils Absolute: 57 cells/uL (ref 0–200)
Eosinophils Absolute: 166 cells/uL (ref 15–500)
Eosinophils Relative: 3.2 %
HEMATOCRIT: 37.1 % (ref 35.0–45.0)
Hemoglobin: 12.2 g/dL (ref 11.7–15.5)
LYMPHS ABS: 2938 {cells}/uL (ref 850–3900)
MCH: 26.7 pg — ABNORMAL LOW (ref 27.0–33.0)
MCHC: 32.9 g/dL (ref 32.0–36.0)
MCV: 81.2 fL (ref 80.0–100.0)
MPV: 10.7 fL (ref 7.5–12.5)
Monocytes Relative: 8 %
Neutro Abs: 1622 cells/uL (ref 1500–7800)
Neutrophils Relative %: 31.2 %
PLATELETS: 281 10*3/uL (ref 140–400)
RBC: 4.57 10*6/uL (ref 3.80–5.10)
RDW: 14.1 % (ref 11.0–15.0)
TOTAL LYMPHOCYTE: 56.5 %
WBC: 5.2 10*3/uL (ref 3.8–10.8)
WBCMIX: 416 {cells}/uL (ref 200–950)

## 2018-01-06 LAB — HEMOGLOBIN A1C
HEMOGLOBIN A1C: 6.7 %{Hb} — AB (ref <5.7)
Mean Plasma Glucose: 146 (calc)
eAG (mmol/L): 8.1 (calc)

## 2018-01-06 LAB — LIPID PANEL
CHOL/HDL RATIO: 5.3 (calc) — AB (ref <5.0)
Cholesterol: 309 mg/dL — ABNORMAL HIGH (ref <200)
HDL: 58 mg/dL (ref >50)
LDL CHOLESTEROL (CALC): 227 mg/dL — AB
NON-HDL CHOLESTEROL (CALC): 251 mg/dL — AB (ref <130)
TRIGLYCERIDES: 108 mg/dL (ref <150)

## 2018-01-06 MED ORDER — ROSUVASTATIN CALCIUM 20 MG PO TABS: 20.0000 mg | ORAL_TABLET | Freq: Every day | ORAL | 3 refills | Status: DC

## 2018-01-06 NOTE — Telephone Encounter (Signed)
Medication list is up-to-date and Rx is sent to pharmacy

## 2018-01-12 ENCOUNTER — Encounter: Payer: Self-pay | Admitting: Nurse Practitioner

## 2018-01-12 ENCOUNTER — Ambulatory Visit (INDEPENDENT_AMBULATORY_CARE_PROVIDER_SITE_OTHER): Payer: Medicare HMO | Admitting: Nurse Practitioner

## 2018-01-12 VITALS — BP 134/76 | HR 67 | Temp 98.0°F | Ht 67.0 in | Wt 137.0 lb

## 2018-01-12 DIAGNOSIS — Z0000 Encounter for general adult medical examination without abnormal findings: Secondary | ICD-10-CM | POA: Diagnosis not present

## 2018-01-12 DIAGNOSIS — E119 Type 2 diabetes mellitus without complications: Secondary | ICD-10-CM | POA: Diagnosis not present

## 2018-01-12 DIAGNOSIS — E782 Mixed hyperlipidemia: Secondary | ICD-10-CM

## 2018-01-12 NOTE — Patient Instructions (Signed)
Diabetes Mellitus and Nutrition When you have diabetes (diabetes mellitus), it is very important to have healthy eating habits because your blood sugar (glucose) levels are greatly affected by what you eat and drink. Eating healthy foods in the appropriate amounts, at about the same times every day, can help you:  Control your blood glucose.  Lower your risk of heart disease.  Improve your blood pressure.  Reach or maintain a healthy weight.  Every person with diabetes is different, and each person has different needs for a meal plan. Your health care provider may recommend that you work with a diet and nutrition specialist (dietitian) to make a meal plan that is best for you. Your meal plan may vary depending on factors such as:  The calories you need.  The medicines you take.  Your weight.  Your blood glucose, blood pressure, and cholesterol levels.  Your activity level.  Other health conditions you have, such as heart or kidney disease.  How do carbohydrates affect me? Carbohydrates affect your blood glucose level more than any other type of food. Eating carbohydrates naturally increases the amount of glucose in your blood. Carbohydrate counting is a method for keeping track of how many carbohydrates you eat. Counting carbohydrates is important to keep your blood glucose at a healthy level, especially if you use insulin or take certain oral diabetes medicines. It is important to know how many carbohydrates you can safely have in each meal. This is different for every person. Your dietitian can help you calculate how many carbohydrates you should have at each meal and for snack. Foods that contain carbohydrates include:  Bread, cereal, rice, pasta, and crackers.  Potatoes and corn.  Peas, beans, and lentils.  Milk and yogurt.  Fruit and juice.  Desserts, such as cakes, cookies, ice cream, and candy.  How does alcohol affect me? Alcohol can cause a sudden decrease in  blood glucose (hypoglycemia), especially if you use insulin or take certain oral diabetes medicines. Hypoglycemia can be a life-threatening condition. Symptoms of hypoglycemia (sleepiness, dizziness, and confusion) are similar to symptoms of having too much alcohol. If your health care provider says that alcohol is safe for you, follow these guidelines:  Limit alcohol intake to no more than 1 drink per day for nonpregnant women and 2 drinks per day for men. One drink equals 12 oz of beer, 5 oz of wine, or 1 oz of hard liquor.  Do not drink on an empty stomach.  Keep yourself hydrated with water, diet soda, or unsweetened iced tea.  Keep in mind that regular soda, juice, and other mixers may contain a lot of sugar and must be counted as carbohydrates.  What are tips for following this plan? Reading food labels  Start by checking the serving size on the label. The amount of calories, carbohydrates, fats, and other nutrients listed on the label are based on one serving of the food. Many foods contain more than one serving per package.  Check the total grams (g) of carbohydrates in one serving. You can calculate the number of servings of carbohydrates in one serving by dividing the total carbohydrates by 15. For example, if a food has 30 g of total carbohydrates, it would be equal to 2 servings of carbohydrates.  Check the number of grams (g) of saturated and trans fats in one serving. Choose foods that have low or no amount of these fats.  Check the number of milligrams (mg) of sodium in one serving.  Most people should limit total sodium intake to less than 2,300 mg per day.  Always check the nutrition information of foods labeled as "low-fat" or "nonfat". These foods may be higher in added sugar or refined carbohydrates and should be avoided.  Talk to your dietitian to identify your daily goals for nutrients listed on the label. Shopping  Avoid buying canned, premade, or processed foods.  These foods tend to be high in fat, sodium, and added sugar.  Shop around the outside edge of the grocery store. This includes fresh fruits and vegetables, bulk grains, fresh meats, and fresh dairy. Cooking  Use low-heat cooking methods, such as baking, instead of high-heat cooking methods like deep frying.  Cook using healthy oils, such as olive, canola, or sunflower oil.  Avoid cooking with butter, cream, or high-fat meats. Meal planning  Eat meals and snacks regularly, preferably at the same times every day. Avoid going long periods of time without eating.  Eat foods high in fiber, such as fresh fruits, vegetables, beans, and whole grains. Talk to your dietitian about how many servings of carbohydrates you can eat at each meal.  Eat 4-6 ounces of lean protein each day, such as lean meat, chicken, fish, eggs, or tofu. 1 ounce is equal to 1 ounce of meat, chicken, or fish, 1 egg, or 1/4 cup of tofu.  Eat some foods each day that contain healthy fats, such as avocado, nuts, seeds, and fish. Lifestyle   Check your blood glucose regularly.  Exercise at least 30 minutes 5 or more days each week, or as told by your health care provider.  Take medicines as told by your health care provider.  Do not use any products that contain nicotine or tobacco, such as cigarettes and e-cigarettes. If you need help quitting, ask your health care provider.  Work with a Social worker or diabetes educator to identify strategies to manage stress and any emotional and social challenges. What are some questions to ask my health care provider?  Do I need to meet with a diabetes educator?  Do I need to meet with a dietitian?  What number can I call if I have questions?  When are the best times to check my blood glucose? Where to find more information:  American Diabetes Association: diabetes.org/food-and-fitness/food  Academy of Nutrition and Dietetics:  PokerClues.dk  [employer] of Diabetes and Digestive and Kidney Diseases (NIH): ContactWire.be Summary  A healthy meal plan will help you control your blood glucose and maintain a healthy lifestyle.  Working with a diet and nutrition specialist (dietitian) can help you make a meal plan that is best for you.  Keep in mind that carbohydrates and alcohol have immediate effects on your blood glucose levels. It is important to count carbohydrates and to use alcohol carefully. This information is not intended to replace advice given to you by your health care provider. Make sure you discuss any questions you have with your health care provider. Document Released: 07/02/2005 Document Revised: 11/09/2016 Document Reviewed: 11/09/2016 Elsevier Interactive Patient Education  2018 [place] Maintenance, Female Adopting a healthy lifestyle and getting preventive care can go a long way to promote health and wellness. Talk with your health care provider about what schedule of regular examinations is right for you. This is a good chance for you to check in with your provider about disease prevention and staying healthy. In between checkups, there are plenty of things you can do on your own. Experts have done a lot  of research about which lifestyle changes and preventive measures are most likely to keep you healthy. Ask your health care provider for more information. Weight and diet Eat a healthy diet  Be sure to include plenty of vegetables, fruits, low-fat dairy products, and lean protein.  Do not eat a lot of foods high in solid fats, added sugars, or salt.  Get regular exercise. This is one of the most important things you can do for your health. ? Most adults should exercise for at least 150 minutes each week. The exercise should increase your heart rate and make  you sweat (moderate-intensity exercise). ? Most adults should also do strengthening exercises at least twice a week. This is in addition to the moderate-intensity exercise.  Maintain a healthy weight  Body mass index (BMI) is a measurement that can be used to identify possible weight problems. It estimates body fat based on height and weight. Your health care provider can help determine your BMI and help you achieve or maintain a healthy weight.  For females 73 years of age and older: ? A BMI below 18.5 is considered underweight. ? A BMI of 18.5 to 24.9 is normal. ? A BMI of 25 to 29.9 is considered overweight. ? A BMI of 30 and above is considered obese.  Watch levels of cholesterol and blood lipids  You should start having your blood tested for lipids and cholesterol at 69 years of age, then have this test every 5 years.  You may need to have your cholesterol levels checked more often if: ? Your lipid or cholesterol levels are high. ? You are older than 69 years of age. ? You are at high risk for heart disease.  Cancer screening Lung Cancer  Lung cancer screening is recommended for adults 13-21 years old who are at high risk for lung cancer because of a history of smoking.  A yearly low-dose CT scan of the lungs is recommended for people who: ? Currently smoke. ? Have quit within the past 15 years. ? Have at least a 30-pack-year history of smoking. A pack year is smoking an average of one pack of cigarettes a day for 1 year.  Yearly screening should continue until it has been 15 years since you quit.  Yearly screening should stop if you develop a health problem that would prevent you from having lung cancer treatment.  Breast Cancer  Practice breast self-awareness. This means understanding how your breasts normally appear and feel.  It also means doing regular breast self-exams. Let your health care provider know about any changes, no matter how small.  If you are in your  20s or 30s, you should have a clinical breast exam (CBE) by a health care provider every 1-3 years as part of a regular health exam.  If you are 58 or older, have a CBE every year. Also consider having a breast X-ray (mammogram) every year.  If you have a family history of breast cancer, talk to your health care provider about genetic screening.  If you are at high risk for breast cancer, talk to your health care provider about having an MRI and a mammogram every year.  Breast cancer gene (BRCA) assessment is recommended for women who have family members with BRCA-related cancers. BRCA-related cancers include: ? Breast. ? Ovarian. ? Tubal. ? Peritoneal cancers.  Results of the assessment will determine the need for genetic counseling and BRCA1 and BRCA2 testing.  Cervical Cancer Your health care provider may recommend that  you be screened regularly for cancer of the pelvic organs (ovaries, uterus, and vagina). This screening involves a pelvic examination, including checking for microscopic changes to the surface of your cervix (Pap test). You may be encouraged to have this screening done every 3 years, beginning at age 22.  For women ages 10-65, health care providers may recommend pelvic exams and Pap testing every 3 years, or they may recommend the Pap and pelvic exam, combined with testing for human papilloma virus (HPV), every 5 years. Some types of HPV increase your risk of cervical cancer. Testing for HPV may also be done on women of any age with unclear Pap test results.  Other health care providers may not recommend any screening for nonpregnant women who are considered low risk for pelvic cancer and who do not have symptoms. Ask your health care provider if a screening pelvic exam is right for you.  If you have had past treatment for cervical cancer or a condition that could lead to cancer, you need Pap tests and screening for cancer for at least 20 years after your treatment. If Pap  tests have been discontinued, your risk factors (such as having a new sexual partner) need to be reassessed to determine if screening should resume. Some women have medical problems that increase the chance of getting cervical cancer. In these cases, your health care provider may recommend more frequent screening and Pap tests.  Colorectal Cancer  This type of cancer can be detected and often prevented.  Routine colorectal cancer screening usually begins at 69 years of age and continues through 69 years of age.  Your health care provider may recommend screening at an earlier age if you have risk factors for colon cancer.  Your health care provider may also recommend using home test kits to check for hidden blood in the stool.  A small camera at the end of a tube can be used to examine your colon directly (sigmoidoscopy or colonoscopy). This is done to check for the earliest forms of colorectal cancer.  Routine screening usually begins at age 53.  Direct examination of the colon should be repeated every 5-10 years through 69 years of age. However, you may need to be screened more often if early forms of precancerous polyps or small growths are found.  Skin Cancer  Check your skin from head to toe regularly.  Tell your health care provider about any new moles or changes in moles, especially if there is a change in a mole's shape or color.  Also tell your health care provider if you have a mole that is larger than the size of a pencil eraser.  Always use sunscreen. Apply sunscreen liberally and repeatedly throughout the day.  Protect yourself by wearing long sleeves, pants, a wide-brimmed hat, and sunglasses whenever you are outside.  Heart disease, diabetes, and high blood pressure  High blood pressure causes heart disease and increases the risk of stroke. High blood pressure is more likely to develop in: ? People who have blood pressure in the high end of the normal range  (130-139/85-89 mm Hg). ? People who are overweight or obese. ? People who are African American.  If you are 48-7 years of age, have your blood pressure checked every 3-5 years. If you are 43 years of age or older, have your blood pressure checked every year. You should have your blood pressure measured twice-once when you are at a hospital or clinic, and once when you are not at  a hospital or clinic. Record the average of the two measurements. To check your blood pressure when you are not at a hospital or clinic, you can use: ? An automated blood pressure machine at a pharmacy. ? A home blood pressure monitor.  If you are between 15 years and 20 years old, ask your health care provider if you should take aspirin to prevent strokes.  Have regular diabetes screenings. This involves taking a blood sample to check your fasting blood sugar level. ? If you are at a normal weight and have a low risk for diabetes, have this test once every three years after 69 years of age. ? If you are overweight and have a high risk for diabetes, consider being tested at a younger age or more often. Preventing infection Hepatitis B  If you have a higher risk for hepatitis B, you should be screened for this virus. You are considered at high risk for hepatitis B if: ? You were born in a country where hepatitis B is common. Ask your health care provider which countries are considered high risk. ? Your parents were born in a high-risk country, and you have not been immunized against hepatitis B (hepatitis B vaccine). ? You have HIV or AIDS. ? You use needles to inject street drugs. ? You live with someone who has hepatitis B. ? You have had sex with someone who has hepatitis B. ? You get hemodialysis treatment. ? You take certain medicines for conditions, including cancer, organ transplantation, and autoimmune conditions.  Hepatitis C  Blood testing is recommended for: ? Everyone born from [birth year] through  1967. ? Anyone with known risk factors for hepatitis C.  Sexually transmitted infections (STIs)  You should be screened for sexually transmitted infections (STIs) including gonorrhea and chlamydia if: ? You are sexually active and are younger than 69 years of age. ? You are older than 69 years of age and your health care provider tells you that you are at risk for this type of infection. ? Your sexual activity has changed since you were last screened and you are at an increased risk for chlamydia or gonorrhea. Ask your health care provider if you are at risk.  If you do not have HIV, but are at risk, it may be recommended that you take a prescription medicine daily to prevent HIV infection. This is called pre-exposure prophylaxis (PrEP). You are considered at risk if: ? You are sexually active and do not regularly use condoms or know the HIV status of your partner(s). ? You take drugs by injection. ? You are sexually active with a partner who has HIV.  Talk with your health care provider about whether you are at high risk of being infected with HIV. If you choose to begin PrEP, you should first be tested for HIV. You should then be tested every 3 months for as long as you are taking PrEP. Pregnancy  If you are premenopausal and you may become pregnant, ask your health care provider about preconception counseling.  If you may become pregnant, take 400 to 800 micrograms (mcg) of folic acid every day.  If you want to prevent pregnancy, talk to your health care provider about birth control (contraception). Osteoporosis and menopause  Osteoporosis is a disease in which the bones lose minerals and strength with aging. This can result in serious bone fractures. Your risk for osteoporosis can be identified using a bone density scan.  If you are 68 years of age or  older, or if you are at risk for osteoporosis and fractures, ask your health care provider if you should be screened.  Ask your health  care provider whether you should take a calcium or vitamin D supplement to lower your risk for osteoporosis.  Menopause may have certain physical symptoms and risks.  Hormone replacement therapy may reduce some of these symptoms and risks. Talk to your health care provider about whether hormone replacement therapy is right for you. Follow these instructions at home:  Schedule regular health, dental, and eye exams.  Stay current with your immunizations.  Do not use any tobacco products including cigarettes, chewing tobacco, or electronic cigarettes.  If you are pregnant, do not drink alcohol.  If you are breastfeeding, limit how much and how often you drink alcohol.  Limit alcohol intake to no more than 1 drink per day for nonpregnant women. One drink equals 12 ounces of beer, 5 ounces of wine, or 1 ounces of hard liquor.  Do not use street drugs.  Do not share needles.  Ask your health care provider for help if you need support or information about quitting drugs.  Tell your health care provider if you often feel depressed.  Tell your health care provider if you have ever been abused or do not feel safe at home. This information is not intended to replace advice given to you by your health care provider. Make sure you discuss any questions you have with your health care provider. Document Released: 04/20/2011 Document Revised: 03/12/2016 Document Reviewed: 07/09/2015 Elsevier Interactive Patient Education  [employer].

## 2018-01-12 NOTE — Progress Notes (Signed)
Provider: [name], NP  Patient Care Team: [name], NP as PCP - General (Geriatric Medicine)  Extended Emergency Contact Information Primary Emergency Contact: Ahlgrim,[name] Address: [address]          [address]          [place] [address] Johnnette Litter of [place] Phone: [phone] Relation: Daughter No Known Allergies Code Status: FULL  Goals of Care: Advanced Directive information Advanced Directives 01/05/2018  Does Patient Have a Medical Advance Directive? No  Would patient like information on creating a medical advance directive? Yes (MAU/Ambulatory/Procedural Areas - Information given)     Chief Complaint  Patient presents with  . Medical Management of Chronic Issues    Pt is being seen for a physical.     HPI: Patient is a 67 y.o. female seen in today for an annual wellness exam.   Major illnesses or hospitalization in the last year-none Could not afford TDAP went to pharmacy to get this.    Depression screen [facility] 2/9 01/12/2018 01/05/2018 12/28/2017  Decreased Interest 0 0 0  Down, Depressed, Hopeless 0 0 0  PHQ - 2 Score 0 0 0    Fall Risk  01/12/2018 01/05/2018 12/30/2017 12/28/2017  Falls in the past year? No No No No   MMSE - Mini Mental State Exam 01/05/2018  Orientation to time 5  Orientation to Place 5  Registration 3  Attention/ Calculation 5  Recall 3  Language- name 2 objects 2  Language- repeat 1  Language- follow 3 step command 3  Language- read & follow direction 1  Write a sentence 1  Copy design 1  Total score 30     Health Maintenance  Topic Date Due  . MAMMOGRAM  08/12/1999  . COLONOSCOPY  08/12/1999  . DEXA SCAN  08/11/2014  . INFLUENZA VACCINE  08/30/2018 (Originally 05/19/2017)  . PNA vac Low Risk Adult (1 of 2 - PCV13) 12/29/2018 (Originally 08/11/2014)  . TETANUS/TDAP  01/13/2019 (Originally 08/11/1968)  . Hepatitis C Screening  01/13/2019 (Originally Oct 04, 1951)    Past Medical History:    Diagnosis Date  . Cervical radiculopathy   . Fatigue   . Fatigue   . Generalized osteoarthrosis   . Generalized osteoarthrosis   . Hyperlipidemia   . Lateral epicondylitis of left elbow 06/28/2017  . Left knee pain   . Menopause   . Neck pain   . Pre-diabetes   . Swelling of left knee joint     Past Surgical History:  Procedure Laterality Date  . ABCESS DRAINAGE Left 09/15/2017   behind tonsil  . VAGINAL HYSTERECTOMY  1983    Social History   Socioeconomic History  . Marital status: Divorced    Spouse name: Not on file  . Number of children: Not on file  . Years of education: Not on file  . Highest education level: Not on file  Occupational History  . Not on file  Social Needs  . Financial resource strain: Not hard at all  . Food insecurity:    Worry: Never true    Inability: Never true  . Transportation needs:    Medical: No    Non-medical: No  Tobacco Use  . Smoking status: Current Every Day Smoker    Packs/day: 0.75    Years: 50.00    Pack years: 37.50    Types: Cigarettes  . Smokeless tobacco: Never Used  Substance and Sexual Activity  . Alcohol use: No  Frequency: Never  . Drug use: No  . Sexual activity: Not on file  Lifestyle  . Physical activity:    Days per week: 3 days    Minutes per session: 20 min  . Stress: Only a little  Relationships  . Social connections:    Talks on phone: More than three times a week    Gets together: Once a week    Attends religious service: Not on file    Active member of club or organization: No    Attends meetings of clubs or organizations: Never    Relationship status: Divorced  Other Topics Concern  . Not on file  Social History Narrative   Social History      Diet? none      Do you drink/eat things with caffeine? Chocolate- occasionally      Marital status?        divorced                            What year were you married? 1982      Do you live in a house, apartment, assisted living, condo,  trailer, etc.? apartment      Is it one or more stories? 1      How many persons live in your home? 1       Do you have any pets in your home? (please list) yes, dog      Highest level of education completed? Associate degree- college      Current or past profession: retired      Do you exercise?           yes                           Type & how often? Walk- daily      Advanced Directives      Do you have a living will? yes      Do you have a DNR form?     yes                             If not, do you want to discuss one? yes      Do you have signed POA/HPOA for forms? no      Functional Status      Do you have difficulty bathing or dressing yourself?      Do you have difficulty preparing food or eating?       Do you have difficulty managing your medications?      Do you have difficulty managing your finances?      Do you have difficulty affording your medications?    Family History  Problem Relation Age of Onset  . Stroke Mother   . Diabetes Mother   . Dementia Mother   . Cancer Sister        unknown  . Cancer Sister        unknown  . Kidney failure Brother   . Cancer Brother   . Cancer Brother     Review of Systems:  Review of Systems  Constitutional: Negative for chills and fever.  HENT: Positive for postnasal drip and rhinorrhea. Negative for tinnitus.   Eyes: Negative for discharge and visual disturbance.  Respiratory: Negative for cough and shortness of breath.   Cardiovascular: Negative for chest pain, palpitations and  leg swelling.  Gastrointestinal: Negative for abdominal pain, constipation and diarrhea.  Genitourinary: Negative for dysuria, frequency and urgency.  Musculoskeletal: Negative for back pain and myalgias.  Skin: Negative.   Neurological: Negative for dizziness and headaches.  Psychiatric/Behavioral: Negative for behavioral problems. The patient is not nervous/anxious.      Allergies as of 01/12/2018   No Known Allergies       Medication List        Accurate as of 01/12/18  1:24 PM. Always use your most recent med list.          CALCIUM CITRATE + D PO Take 1 tablet by mouth daily.   diphenhydrAMINE 25 MG tablet Commonly known as:  BENADRYL Take 25 mg by mouth daily.   fluticasone 50 MCG/ACT nasal spray Commonly known as:  FLONASE Place 1 spray into both nostrils 2 (two) times daily.   ibuprofen 200 MG tablet Commonly known as:  ADVIL,MOTRIN Take 200 mg by mouth daily as needed.   rosuvastatin 20 MG tablet Commonly known as:  CRESTOR Take 1 tablet (20 mg total) by mouth daily.   SUPER ENZYMES Tabs Take 1 tablet by mouth daily.   vitamin E 400 UNIT capsule Take 400 Units by mouth 2 (two) times daily.         Physical Exam: Vitals:   01/12/18 1322  BP: 134/76  Pulse: 67  Temp: 98 F (36.7 C)  TempSrc: Oral  SpO2: 99%  Weight: 137 lb (62.1 kg)  Height: 5\' 7"  (1.702 m)   Body mass index is 21.46 kg/m. Physical Exam  Constitutional: She is oriented to person, place, and time. She appears well-developed and well-nourished. No distress.  HENT:  Head: Normocephalic and atraumatic.  Right Ear: External ear normal.  Left Ear: External ear normal.  Nose: Nose normal.  Mouth/Throat: Oropharynx is clear and moist. No oropharyngeal exudate.  Eyes: Pupils are equal, round, and reactive to light. Conjunctivae are normal.  Neck: Normal range of motion. Neck supple.  Cardiovascular: Normal rate, regular rhythm, normal heart sounds and intact distal pulses.  Pulmonary/Chest: Effort normal and breath sounds normal. She exhibits no mass. Right breast exhibits no inverted nipple and no mass. Left breast exhibits no inverted nipple and no mass.  Abdominal: Soft. Bowel sounds are normal. She exhibits no distension. There is no tenderness.  Musculoskeletal: Normal range of motion. She exhibits no edema or tenderness.  Neurological: She is alert and oriented to person, place, and time. She has  normal reflexes.  Skin: Skin is warm and dry. She is not diaphoretic.  Psychiatric: She has a normal mood and affect.    Labs reviewed: Basic Metabolic Panel: Recent Labs    01/19/17 01/05/18 0816  NA 141  141 138  K 4.3  4.3 4.5  CL  --  104  CO2  --  31  GLUCOSE  --  100*  BUN 17  17 16   CREATININE 0.8  0.8 0.80  CALCIUM  --  9.6   Liver Function Tests: Recent Labs    01/19/17 01/05/18 0816  AST 16  16 14   ALT 17  17 10   ALKPHOS 72  72  --   BILITOT  --  0.3  PROT  --  6.8   No results for input(s): LIPASE, AMYLASE in the last 8760 hours. No results for input(s): AMMONIA in the last 8760 hours. CBC: Recent Labs    01/05/18 0816  WBC 5.2  NEUTROABS 1,622  HGB 12.2  HCT 37.1  MCV 81.2  PLT 281   Lipid Panel: Recent Labs    01/19/17 01/05/18 0816  CHOL 307*  307* 309*  HDL 52  52 58  LDLCALC 217  217 227*  TRIG 189*  189* 108  CHOLHDL  --  5.3*   Lab Results  Component Value Date   HGBA1C 6.7 (H) 01/05/2018    Procedures: No results found.  Assessment/Plan 1. Wellness examination Doing well. dexa scan and mammogram scheduled. Declines flu and pneumonia vaccines. AWV has been complete.  The patient was counseled regarding the appropriate use of alcohol, regular self-examination of the breasts on a monthly basis, prevention of dental and periodontal disease, diet, regular sustained exercise for at least 30 minutes 5 times per week,routine screening interval for mammogram as recommended by the [place] and ACOG, importance of regular PAP smears, nd recommended schedule for GI hemoccult testing, colonoscopy, cholesterol, thyroid and diabetes screening. -will get records from previous GYN   2. Mixed hyperlipidemia -has started Crestor without side effects. Dietary modifications encouraged pt has changed diet. Continues with  - Lipid Panel; Future - CMP; Future  3. Diabetes mellitus without complication ([place]) -continues with  diet modifications.  - Hemoglobin A1c; Future  Next appt: 3 month follow up with lab work prior to [employer]. [place] [phone]

## 2018-01-21 ENCOUNTER — Telehealth: Payer: Self-pay

## 2018-01-21 NOTE — Telephone Encounter (Signed)
I called patient to see about scheduling a 3 month follow up with Janett Billow and a lab appointment prior to that appt. I left a message asking that patient call the office.

## 2018-01-25 NOTE — Telephone Encounter (Signed)
Patient has scheduled appointments.   

## 2018-01-26 ENCOUNTER — Other Ambulatory Visit: Payer: Self-pay | Admitting: Nurse Practitioner

## 2018-01-26 DIAGNOSIS — J0140 Acute pansinusitis, unspecified: Secondary | ICD-10-CM

## 2018-02-08 ENCOUNTER — Ambulatory Visit
Admission: RE | Admit: 2018-02-08 | Discharge: 2018-02-08 | Disposition: A | Discharge status: Home / Self Care | Payer: Medicare HMO | Source: Ambulatory Visit | Attending: Nurse Practitioner | Admitting: Nurse Practitioner

## 2018-02-08 DIAGNOSIS — Z1231 Encounter for screening mammogram for malignant neoplasm of breast: Secondary | ICD-10-CM | POA: Diagnosis not present

## 2018-02-08 DIAGNOSIS — E2839 Other primary ovarian failure: Secondary | ICD-10-CM

## 2018-02-08 DIAGNOSIS — Z780 Asymptomatic menopausal state: Secondary | ICD-10-CM | POA: Diagnosis not present

## 2018-02-08 DIAGNOSIS — Z1239 Encounter for other screening for malignant neoplasm of breast: Secondary | ICD-10-CM

## 2018-02-08 DIAGNOSIS — M8588 Other specified disorders of bone density and structure, other site: Secondary | ICD-10-CM | POA: Diagnosis not present

## 2018-02-09 ENCOUNTER — Other Ambulatory Visit: Payer: Self-pay | Admitting: Nurse Practitioner

## 2018-02-09 DIAGNOSIS — R921 Mammographic calcification found on diagnostic imaging of breast: Secondary | ICD-10-CM

## 2018-02-15 ENCOUNTER — Ambulatory Visit
Admission: RE | Admit: 2018-02-15 | Discharge: 2018-02-15 | Disposition: A | Discharge status: Home / Self Care | Payer: Medicare HMO | Source: Ambulatory Visit | Attending: Nurse Practitioner | Admitting: Nurse Practitioner

## 2018-02-15 ENCOUNTER — Other Ambulatory Visit: Payer: Self-pay | Admitting: Nurse Practitioner

## 2018-02-15 DIAGNOSIS — R921 Mammographic calcification found on diagnostic imaging of breast: Secondary | ICD-10-CM | POA: Diagnosis not present

## 2018-03-15 ENCOUNTER — Other Ambulatory Visit: Payer: Self-pay | Admitting: Nurse Practitioner

## 2018-03-15 ENCOUNTER — Inpatient Hospital Stay: Admission: RE | Admit: 2018-03-15 | Payer: Medicare HMO | Source: Ambulatory Visit

## 2018-03-15 DIAGNOSIS — R921 Mammographic calcification found on diagnostic imaging of breast: Secondary | ICD-10-CM

## 2018-04-04 ENCOUNTER — Other Ambulatory Visit: Payer: Self-pay

## 2018-04-14 ENCOUNTER — Ambulatory Visit: Payer: Self-pay | Admitting: Nurse Practitioner

## 2018-11-07 ENCOUNTER — Ambulatory Visit (INDEPENDENT_AMBULATORY_CARE_PROVIDER_SITE_OTHER): Payer: Medicare HMO | Admitting: Family

## 2018-11-07 ENCOUNTER — Encounter: Payer: Self-pay | Admitting: Family

## 2018-11-07 ENCOUNTER — Ambulatory Visit
Admission: RE | Admit: 2018-11-07 | Discharge: 2018-11-07 | Disposition: A | Discharge status: Home / Self Care | Payer: Medicare HMO | Source: Ambulatory Visit | Attending: Family | Admitting: Family

## 2018-11-07 VITALS — BP 150/80 | HR 80 | Temp 98.3°F | Ht 67.0 in | Wt 141.6 lb

## 2018-11-07 DIAGNOSIS — M545 Low back pain, unspecified: Secondary | ICD-10-CM

## 2018-11-07 DIAGNOSIS — M48061 Spinal stenosis, lumbar region without neurogenic claudication: Secondary | ICD-10-CM | POA: Diagnosis not present

## 2018-11-07 MED ORDER — KETOROLAC TROMETHAMINE 30 MG/ML IJ SOLN
30.0000 mg | Freq: Once | INTRAMUSCULAR | Status: AC
  Administered 2018-11-07: 30 mg via INTRAMUSCULAR

## 2018-11-07 MED ORDER — ACETAMINOPHEN 500 MG PO TABS: 1000.0000 mg | ORAL_TABLET | Freq: Two times a day (BID) | ORAL | 0 refills | Status: AC

## 2018-11-07 MED ORDER — LIDOCAINE 4 % EX PTCH: 1.0000 "application " | MEDICATED_PATCH | Freq: Every day | CUTANEOUS | 0 refills | Status: AC

## 2018-11-07 NOTE — Progress Notes (Addendum)
Provider: Orra Nolde FNP-C  [name], NP  Patient Care Team: [name], NP as PCP - General (Geriatric Medicine)  Extended Emergency Contact Information Primary Emergency Contact: Kemmerling,[name] Address: [address]          [address]          [place] [address] Johnnette Litter of [place] Phone: [phone] Relation: Daughter  Goals of care: Advanced Directive information Advanced Directives 01/05/2018  Does Patient Have a Medical Advance Directive? No  Would patient like information on creating a medical advance directive? Yes (MAU/Ambulatory/Procedural Areas - Information given)     Chief Complaint  Patient presents with  . Acute Visit    Patient c/o of back pain that started wednesday. Patient was putting weather stripping on door while bent over and has hurt since. Patient has been taking aleve and biofreeze for pain.     HPI:  Pt is a 68 y.o. female seen today for an acute visit for evaluation of lower back pain X 6 days.she states was putting weather stripping on her door bending over instead of stooping hurting her low back.she describes pain as sharp pain across her lower back.Pain is worst with sitting and walking.she has taken aleve and applied Biofreeze without any relief.she denies any radiation to legs,numbness or tingling.Also denies any new onset bowel or urine incontinence.No fever,chills or signs and symptoms of urinary tract infections.     Past Medical History:  Diagnosis Date  . Cervical radiculopathy   . Fatigue   . Fatigue   . Generalized osteoarthrosis   . Generalized osteoarthrosis   . Hyperlipidemia   . Lateral epicondylitis of left elbow 06/28/2017  . Left knee pain   . Menopause   . Neck pain   . Pre-diabetes   . Swelling of left knee joint    Past Surgical History:  Procedure Laterality Date  . ABCESS DRAINAGE Left 09/15/2017   behind tonsil  . VAGINAL HYSTERECTOMY  1983    No Known Allergies  Outpatient  Encounter Medications as of 11/07/2018  Medication Sig  . Calcium Citrate-Vitamin D (CALCIUM CITRATE + D PO) Take 1 tablet by mouth daily.  . diphenhydrAMINE (BENADRYL) 25 MG tablet Take 25 mg by mouth as needed.   . fluticasone (FLONASE) 50 MCG/ACT nasal spray SHAKE LIQUID AND USE 1 SPRAY IN EACH NOSTRIL TWICE DAILY  . ibuprofen (ADVIL,MOTRIN) 200 MG tablet Take 200 mg by mouth daily as needed.  . Omega-3 Fatty Acids (FISH OIL PO) Take 1 capsule by mouth daily.  . rosuvastatin (CRESTOR) 20 MG tablet Take 1 tablet (20 mg total) by mouth daily.  . vitamin E 400 UNIT capsule Take 400 Units by mouth 2 (two) times daily.  . [DISCONTINUED] Digestive Enzymes (SUPER ENZYMES) TABS Take 1 tablet by mouth daily.   Facility-Administered Encounter Medications as of 11/07/2018  Medication  . ketorolac (TORADOL) 30 MG/ML injection 30 mg    Review of Systems  Constitutional: Negative for appetite change, chills, fatigue and fever.  Respiratory: Negative for cough, chest tightness, shortness of breath and wheezing.   Gastrointestinal: Negative for abdominal distention, abdominal pain, constipation, diarrhea, nausea and vomiting.  Genitourinary: Negative for dysuria, flank pain, frequency and urgency.  Musculoskeletal: Negative for gait problem.       Acute lower back pain per HPI   Skin: Negative for pallor and rash.  Neurological: Negative for dizziness, weakness, light-headedness, numbness and headaches.    Immunization History  Administered Date(s) Administered  .  Zoster Recombinat (Shingrix) 03/19/2017, 07/19/2017   Pertinent  Health Maintenance Due  Topic Date Due  . FOOT EXAM  08/12/1959  . OPHTHALMOLOGY EXAM  08/12/1959  . COLONOSCOPY  08/12/1999  . URINE MICROALBUMIN  10/14/2017  . INFLUENZA VACCINE  05/19/2018  . HEMOGLOBIN A1C  07/08/2018  . PNA vac Low Risk Adult (1 of 2 - PCV13) 12/29/2018 (Originally 08/11/2014)  . MAMMOGRAM  02/16/2020  . DEXA SCAN  Completed   Fall Risk   11/07/2018 01/12/2018 01/05/2018 12/30/2017 12/28/2017  Falls in the past year? 0 No No No No  Number falls in past yr: 0 - - - -  Injury with Fall? 0 - - - -    Vitals:   11/07/18 0833  BP: (!) 150/80  Pulse: 80  Temp: 98.3 F (36.8 C)  TempSrc: Oral  SpO2: 98%  Weight: 141 lb 9.6 oz (64.2 kg)  Height: 5\' 7"  (1.702 m)   Body mass index is 22.18 kg/m. Physical Exam Vitals signs reviewed.  Constitutional:      Appearance: Normal appearance.     Comments: In acute pain   HENT:     Head: Normocephalic.     Mouth/Throat:     Pharynx: Oropharynx is clear. No oropharyngeal exudate or posterior oropharyngeal erythema.  Eyes:     General: No scleral icterus.       Right eye: No discharge.        Left eye: No discharge.     Conjunctiva/sclera: Conjunctivae normal.     Pupils: Pupils are equal, round, and reactive to light.  Neck:     Musculoskeletal: Normal range of motion. No muscular tenderness.  Cardiovascular:     Rate and Rhythm: Normal rate and regular rhythm.     Pulses: Normal pulses.     Heart sounds: Normal heart sounds. No murmur. No friction rub. No gallop.   Pulmonary:     Effort: Pulmonary effort is normal. No respiratory distress.     Breath sounds: Normal breath sounds. No wheezing, rhonchi or rales.  Chest:     Chest wall: No tenderness.  Abdominal:     General: Bowel sounds are normal. There is no distension.     Palpations: Abdomen is soft. There is no mass.     Tenderness: There is no abdominal tenderness. There is no right CVA tenderness or guarding.  Musculoskeletal:        General: No swelling, tenderness or deformity.     Right lower leg: No edema.     Left lower leg: No edema.     Comments: FROM except guarding with repositioning.  Lymphadenopathy:     Cervical: No cervical adenopathy.  Skin:    General: Skin is warm and dry.     Coloration: Skin is not pale.     Findings: No erythema.  Neurological:     Mental Status: She is alert and oriented  to person, place, and time.     Motor: No weakness.     Coordination: Coordination normal.     Gait: Gait normal.     Deep Tendon Reflexes: Reflexes normal.  Psychiatric:        Mood and Affect: Mood normal.        Speech: Speech normal.        Behavior: Behavior normal.        Thought Content: Thought content normal.        Judgment: Judgment normal.     Labs reviewed: Recent Labs  01/05/18 0816  NA 138  K 4.5  CL 104  CO2 31  GLUCOSE 100*  BUN 16  CREATININE 0.80  CALCIUM 9.6   Recent Labs    01/05/18 0816  AST 14  ALT 10  BILITOT 0.3  PROT 6.8   Recent Labs    01/05/18 0816  WBC 5.2  NEUTROABS 1,622  HGB 12.2  HCT 37.1  MCV 81.2  PLT 281   Lab Results  Component Value Date   TSH 1.34 10/14/2016   Lab Results  Component Value Date   HGBA1C 6.7 (H) 01/05/2018   Lab Results  Component Value Date   CHOL 309 (H) 01/05/2018   HDL 58 01/05/2018   LDLCALC 227 (H) 01/05/2018   TRIG 108 01/05/2018   CHOLHDL 5.3 (H) 01/05/2018    Significant Diagnostic Results in last 30 days:  No results found.  Assessment/Plan   Acute bilateral low back pain without sciatica - Afebrile.No spinal tenderness with palpation though guarding noted with change of position. Negative leg raise.suspect possible muscle strain from bending over cannot rule out fracture given her history of osteoarthritis/osteopenia. - Ketorolac 30 mg injection I.M X 1 dose now administered today by [facility] CMA patient verbalize much improvement with the pain.   - Apply lidocaine 4 % topical patch once daily remove after 12 hours. - Take extra strength tylenol 1000 mg tablet one by mouth twice daily may take additional 500 mg tablet by mouth daily at 2 Pm as needed. - Get X-ray of lower back done at [place] center.Will call you with results. - - Notify provider if pain worsen or go to ER. - Instruction for propel lifting techniques,Exercise and when to seek emergency provided on visit  summary.    Family/ staff Communication: Reviewed plan of care with patient  Labs/tests ordered: Lumbar spine 3 views X-ray ordered.   Sandrea Hughs, NP

## 2018-11-07 NOTE — Patient Instructions (Addendum)
1. Apply lidocaine 4 % topical patch once daily remove after 12 hours. 2. Take extra strength tylenol 1000 mg tablet one by mouth twice daily may take additional 500 mg tablet by mouth daily at 2 Pm as needed. 3. Notify provider if pain worsen or go to ER 4. Get X-ray of lower back done at [facility].Will call you with results.  Acute Back Pain, Adult Acute back pain is sudden and usually short-lived. It is often caused by an injury to the muscles and tissues in the back. The injury may result from:  A muscle or ligament getting overstretched or torn (strained). Ligaments are tissues that connect bones to each other. Lifting something improperly can cause a back strain.  Wear and tear (degeneration) of the spinal disks. Spinal disks are circular tissue that provides cushioning between the bones of the spine (vertebrae).  Twisting motions, such as while playing sports or doing yard work.  A hit to the back.  Arthritis. You may have a physical exam, lab tests, and imaging tests to find the cause of your pain. Acute back pain usually goes away with rest and home care. Follow these instructions at home: Managing pain, stiffness, and swelling  Take over-the-counter and prescription medicines only as told by your health care provider.  Your health care provider may recommend applying ice during the first 24-48 hours after your pain starts. To do this: ? Put ice in a plastic bag. ? Place a towel between your skin and the bag. ? Leave the ice on for 20 minutes, 2-3 times a day.  If directed, apply heat to the affected area as often as told by your health care provider. Use the heat source that your health care provider recommends, such as a moist heat pack or a heating pad. ? Place a towel between your skin and the heat source. ? Leave the heat on for 20-30 minutes. ? Remove the heat if your skin turns bright red. This is especially important if you are unable to feel pain,  heat, or cold. You have a greater risk of getting burned. Activity   Do not stay in bed. Staying in bed for more than 1-2 days can delay your recovery.  Sit up and stand up straight. Avoid leaning forward when you sit, or hunching over when you stand. ? If you work at a desk, sit close to it so you do not need to lean over. Keep your chin tucked in. Keep your neck drawn back, and keep your elbows bent at a right angle. Your arms should look like the letter "L." ? Sit high and close to the steering wheel when you drive. Add lower back (lumbar) support to your car seat, if needed.  Take short walks on even surfaces as soon as you are able. Try to increase the length of time you walk each day.  Do not sit, drive, or stand in one place for more than 30 minutes at a time. Sitting or standing for long periods of time can put stress on your back.  Do not drive or use heavy machinery while taking prescription pain medicine.  Use proper lifting techniques. When you bend and lift, use positions that put less stress on your back: ? [place] your knees. ? Keep the load close to your body. ? Avoid twisting.  Exercise regularly as told by your health care provider. Exercising helps your back heal faster and helps prevent back injuries by keeping muscles strong and flexible.  Work with a physical therapist to make a safe exercise program, as recommended by your health care provider. Do any exercises as told by your physical therapist. Lifestyle  Maintain a healthy weight. Extra weight puts stress on your back and makes it difficult to have good posture.  Avoid activities or situations that make you feel anxious or stressed. Stress and anxiety increase muscle tension and can make back pain worse. Learn ways to manage anxiety and stress, such as through exercise. General instructions  Sleep on a firm mattress in a comfortable position. Try lying on your side with your knees slightly bent. If you lie on  your back, put a pillow under your knees.  Follow your treatment plan as told by your health care provider. This may include: ? Cognitive or behavioral therapy. ? Acupuncture or massage therapy. ? Meditation or yoga. Contact a health care provider if:  You have pain that is not relieved with rest or medicine.  You have increasing pain going down into your legs or buttocks.  Your pain does not improve after 2 weeks.  You have pain at night.  You lose weight without trying.  You have a fever or chills. Get help right away if:  You develop new bowel or bladder control problems.  You have unusual weakness or numbness in your arms or legs.  You develop nausea or vomiting.  You develop abdominal pain.  You feel faint. Summary  Acute back pain is sudden and usually short-lived.  Use proper lifting techniques. When you bend and lift, use positions that put less stress on your back.  Take over-the-counter and prescription medicines and apply heat or ice as directed by your health care provider. This information is not intended to replace advice given to you by your health care provider. Make sure you discuss any questions you have with your health care provider. Document Released: 10/05/2005 Document Revised: 05/12/2018 Document Reviewed: 05/19/2017 Elsevier Interactive Patient Education  2019 [employer].

## 2018-11-14 ENCOUNTER — Ambulatory Visit (INDEPENDENT_AMBULATORY_CARE_PROVIDER_SITE_OTHER): Payer: Medicare HMO | Admitting: Family

## 2018-11-14 ENCOUNTER — Ambulatory Visit: Payer: Medicare HMO | Admitting: Family

## 2018-11-14 ENCOUNTER — Emergency Department (HOSPITAL_COMMUNITY): Payer: Medicare HMO

## 2018-11-14 ENCOUNTER — Emergency Department (HOSPITAL_COMMUNITY)
Admission: EM | Admit: 2018-11-14 | Discharge: 2018-11-14 | Disposition: A | Discharge status: Home / Self Care | Payer: Medicare HMO | Attending: Emergency Medicine | Admitting: Emergency Medicine

## 2018-11-14 ENCOUNTER — Other Ambulatory Visit: Payer: Self-pay

## 2018-11-14 ENCOUNTER — Encounter (HOSPITAL_COMMUNITY): Payer: Self-pay

## 2018-11-14 ENCOUNTER — Encounter: Payer: Self-pay | Admitting: Family

## 2018-11-14 VITALS — BP 180/100 | HR 98 | Temp 98.0°F | Ht 67.0 in | Wt 139.4 lb

## 2018-11-14 DIAGNOSIS — M545 Low back pain, unspecified: Secondary | ICD-10-CM

## 2018-11-14 DIAGNOSIS — M5489 Other dorsalgia: Secondary | ICD-10-CM | POA: Diagnosis not present

## 2018-11-14 DIAGNOSIS — M5442 Lumbago with sciatica, left side: Secondary | ICD-10-CM | POA: Diagnosis not present

## 2018-11-14 DIAGNOSIS — R030 Elevated blood-pressure reading, without diagnosis of hypertension: Secondary | ICD-10-CM | POA: Diagnosis not present

## 2018-11-14 DIAGNOSIS — I10 Essential (primary) hypertension: Secondary | ICD-10-CM | POA: Diagnosis not present

## 2018-11-14 DIAGNOSIS — F17210 Nicotine dependence, cigarettes, uncomplicated: Secondary | ICD-10-CM | POA: Insufficient documentation

## 2018-11-14 DIAGNOSIS — Z79899 Other long term (current) drug therapy: Secondary | ICD-10-CM | POA: Diagnosis not present

## 2018-11-14 DIAGNOSIS — M79605 Pain in left leg: Secondary | ICD-10-CM | POA: Diagnosis not present

## 2018-11-14 DIAGNOSIS — R52 Pain, unspecified: Secondary | ICD-10-CM | POA: Diagnosis not present

## 2018-11-14 LAB — URINALYSIS, ROUTINE W REFLEX MICROSCOPIC
BILIRUBIN URINE: NEGATIVE
Glucose, UA: NEGATIVE mg/dL
Hgb urine dipstick: NEGATIVE
KETONES UR: 5 mg/dL — AB
Leukocytes, UA: NEGATIVE
NITRITE: NEGATIVE
PH: 6 (ref 5.0–8.0)
PROTEIN: NEGATIVE mg/dL
Specific Gravity, Urine: 1.02 (ref 1.005–1.030)

## 2018-11-14 MED ORDER — HYDROCODONE-ACETAMINOPHEN 5-325 MG PO TABS
1.0000 | ORAL_TABLET | Freq: Once | ORAL | Status: AC
Start: 2018-11-14 — End: 2018-11-14
  Administered 2018-11-14: 1 via ORAL
  Filled 2018-11-14: qty 1

## 2018-11-14 MED ORDER — KETOROLAC TROMETHAMINE 30 MG/ML IJ SOLN
30.0000 mg | Freq: Once | INTRAMUSCULAR | Status: AC
  Administered 2018-11-14: 30 mg via INTRAVENOUS

## 2018-11-14 MED ORDER — HYDROCODONE-ACETAMINOPHEN 5-325 MG PO TABS: 1.0000 | ORAL_TABLET | Freq: Four times a day (QID) | ORAL | 0 refills | Status: DC | PRN

## 2018-11-14 NOTE — ED Notes (Signed)
Pt to XRAY

## 2018-11-14 NOTE — ED Notes (Signed)
Bed: [facility] Expected date:  Expected time:  Means of arrival:  Comments: 

## 2018-11-14 NOTE — Patient Instructions (Signed)
Severe lower back pain with radiation to left leg.tordal 30 mg I.m given x 1 dose. Send to ER for further evaluation.

## 2018-11-14 NOTE — ED Triage Notes (Signed)
Pt BIBA from dr's office. 3 weeks ago pt "threw her back out". 4 days ago, pt states she may have aggravated it. Pt c/o pelvic and back pain. Pt went to the dr's last week, and was given toradol. Pt was seen again today, toradol was given, but pt wanted further eval.  30mg  toradol received @ 1400

## 2018-11-14 NOTE — ED Provider Notes (Signed)
[facility] DEPT Provider Note   CSN: [id number] Arrival date & time: 11/14/18  1507     History   Chief Complaint Chief Complaint  Patient presents with  . Back Pain    HPI Emily Hayes is a 68 y.o. female.  HPI  68 year old female presents with atraumatic back pain.  Started about 2 weeks ago.  Has progressed to the point that it is so painful it is very difficult to walk.  The pain is currently radiating down her left posterior leg to her ankle.  No fevers, abdominal pain, urinary symptoms.  No incontinence or saddle anesthesia.  Her leg is not weak or numb but is very painful.  The patient has been taking Tylenol extra strength at home at the best of her doctor.  Was given Toradol in the clinic today and sent here. Has also been trying a topical patch.  Past Medical History:  Diagnosis Date  . Cervical radiculopathy   . Fatigue   . Fatigue   . Generalized osteoarthrosis   . Generalized osteoarthrosis   . Hyperlipidemia   . Lateral epicondylitis of left elbow 06/28/2017  . Left knee pain   . Menopause   . Neck pain   . Pre-diabetes   . Swelling of left knee joint     Patient Active Problem List   Diagnosis Date Noted  . Allergic rhinitis 12/30/2017  . Osteopenia 12/30/2017  . Osteoarthritis of multiple joints 12/30/2017  . Hyperglycemia 12/30/2017  . Mixed hyperlipidemia 12/30/2017    Past Surgical History:  Procedure Laterality Date  . ABCESS DRAINAGE Left 09/15/2017   behind tonsil  . VAGINAL HYSTERECTOMY  1983     OB History   No obstetric history on file.      Home Medications    Prior to Admission medications   Medication Sig Start Date End Date Taking? Authorizing Provider  acetaminophen (TYLENOL) 500 MG tablet Take 2 tablets (1,000 mg total) by mouth 2 (two) times daily for 30 days. Take additional 500 mg tablet at 2 pm as needed. 11/07/18 12/07/18  Ngetich, [name] C, NP  Calcium Citrate-Vitamin D (CALCIUM  CITRATE + D PO) Take 1 tablet by mouth daily.    [provider]  diphenhydrAMINE (BENADRYL) 25 MG tablet Take 25 mg by mouth as needed.     [provider]  fluticasone (FLONASE) 50 MCG/ACT nasal spray SHAKE LIQUID AND USE 1 SPRAY IN EACH NOSTRIL TWICE DAILY 01/26/18   [name], NP  HYDROcodone-acetaminophen (NORCO) 5-325 MG tablet Take 1 tablet by mouth every 6 (six) hours as needed for severe pain. 11/14/18   Sherwood Gambler, MD  ibuprofen (ADVIL,MOTRIN) 200 MG tablet Take 200 mg by mouth daily as needed.    [provider]  Lidocaine 4 % PTCH Apply 1 application topically daily for 14 days. 11/07/18 11/21/18  Ngetich, [name] C, NP  Omega-3 Fatty Acids (FISH OIL PO) Take 1 capsule by mouth daily.    [provider]  rosuvastatin (CRESTOR) 20 MG tablet Take 1 tablet (20 mg total) by mouth daily. 01/06/18   [name], NP  vitamin E 400 UNIT capsule Take 400 Units by mouth 2 (two) times daily.    [provider]    Family History Family History  Problem Relation Age of Onset  . Stroke Mother   . Diabetes Mother   . Dementia Mother   . Cancer Sister        unknown  .  Cancer Sister        unknown  . Kidney failure Brother   . Cancer Brother   . Cancer Brother     Social History Social History   Tobacco Use  . Smoking status: Current Every Day Smoker    Packs/day: 0.25    Years: 50.00    Pack years: 12.50    Types: Cigarettes  . Smokeless tobacco: Never Used  Substance Use Topics  . Alcohol use: No    Frequency: Never  . Drug use: No     Allergies   Patient has no known allergies.   Review of Systems Review of Systems  Constitutional: Negative for fever.  Gastrointestinal: Negative for abdominal pain.  Genitourinary: Negative for dysuria and hematuria.  Musculoskeletal: Positive for back pain.  Neurological: Negative for weakness and numbness.  All other systems reviewed and are negative.    Physical  Exam Updated Vital Signs BP (!) 157/82 (BP Location: Right Arm)   Pulse 80   Temp 98.9 F (37.2 C) (Oral)   Resp 16   Ht 5\' 7"  (1.702 m)   Wt 63.2 kg   SpO2 99%   BMI 21.83 kg/m   Physical Exam Vitals signs and nursing note reviewed.  Constitutional:      General: She is not in acute distress.    Appearance: She is well-developed. She is not ill-appearing or diaphoretic.  HENT:     Head: Normocephalic and atraumatic.     Right Ear: External ear normal.     Left Ear: External ear normal.     Nose: Nose normal.  Eyes:     General:        Right eye: No discharge.        Left eye: No discharge.  Cardiovascular:     Rate and Rhythm: Normal rate and regular rhythm.     Heart sounds: Normal heart sounds.  Pulmonary:     Effort: Pulmonary effort is normal.     Breath sounds: Normal breath sounds.  Abdominal:     General: There is no distension.     Palpations: Abdomen is soft.     Tenderness: There is no abdominal tenderness.  Musculoskeletal:     Lumbar back: She exhibits tenderness (mild).  Skin:    General: Skin is warm and dry.  Neurological:     Mental Status: She is alert.     Comments: 5/5 strength in BLE. Normal gross sensation. LLE is a little limited by pain but strength is intact. Able to ambulate but with a limp on the left side, causing pain in buttocks.  Psychiatric:        Mood and Affect: Mood is not anxious.      ED Treatments / Results  Labs (all labs ordered are listed, but only abnormal results are displayed) Labs Reviewed  URINALYSIS, ROUTINE W REFLEX MICROSCOPIC - Abnormal; Notable for the following components:      Result Value   APPearance CLOUDY (*)    Ketones, ur 5 (*)    All other components within normal limits    EKG None  Radiology Dg Hip Unilat W Or Wo Pelvis 2-3 Views Left  Result Date: 11/14/2018 CLINICAL DATA:  68 y/o F; 1 day of left leg radiculopathy and history of sciatica. EXAM: DG HIP (WITH OR WITHOUT PELVIS) 2-3V LEFT  COMPARISON:  None. FINDINGS: There is no evidence of hip fracture or dislocation. There is no evidence of arthropathy or other focal bone abnormality.  IMPRESSION: Negative. Electronically Signed   By: Kristine Garbe M.D.   On: 11/14/2018 17:20    Procedures Ultrasound ED Abd Date/Time: 11/14/2018 5:01 PM Performed by: Sherwood Gambler, MD Authorized by: Sherwood Gambler, MD   Procedure details:    Indications: back pain     Assessment for:  AAA   Aorta:  Visualized   Images: archived   Study Limitations: bowel gas Vascular findings:    Aorta: aorta normal (< 3cm)     (including critical care time)  Medications Ordered in ED Medications  HYDROcodone-acetaminophen (NORCO/VICODIN) 5-325 MG per tablet 1 tablet (1 tablet Oral Given 11/14/18 1634)     Initial Impression / Assessment and Plan / ED Course  I have reviewed the triage vital signs and the nursing notes.  Pertinent labs & imaging results that were available during my care of the patient were reviewed by me and considered in my medical decision making (see chart for details).     Patient's presentation is consistent with left-sided sciatica.  She is able to ambulate, though it is painful.  Otherwise, she is neurologically intact.  Urine without hematuria.  Given some pain in the buttocks and hip, x-ray of the hip was obtained but is negative.  She is feeling much better with a dose of hydrocodone.  She was counseled on watching how much Tylenol she takes given there is Tylenol in the [place].  Given her age and hypertension, bedside ultrasound was performed by myself and shows no obvious AAA.  She does not need an emergent MRI but probably needs an outpatient MRI.  She will be referred back to her PCP and discharged with short course of Norco.  Final Clinical Impressions(s) / ED Diagnoses   Final diagnoses:  Acute left-sided low back pain with left-sided sciatica    ED Discharge Orders         Ordered     HYDROcodone-acetaminophen (NORCO) 5-325 MG tablet  Every 6 hours PRN     11/14/18 1728           Sherwood Gambler, MD 11/14/18 1735

## 2018-11-14 NOTE — Progress Notes (Signed)
Provider: [name] Ngetich FNP-C  [name], NP  Patient Care Team: [name], NP as PCP - General (Geriatric Medicine)  Extended Emergency Contact Information Primary Emergency Contact: Vayda,[name] Address: [address]          [address]          [place] [address] Johnnette Litter of [place] Phone: [phone] Relation: Daughter  Goals of care: Advanced Directive information Advanced Directives 11/14/2018  Does Patient Have a Medical Advance Directive? No  Would patient like information on creating a medical advance directive? No - Patient declined     Chief Complaint  Patient presents with  . Acute Visit    Back pain no better states she taken medication given last visit but no relieff. It hurts to walk and sit. Patient states she has been trying to call office for past 3 days and no one returned her calll. Patient is upset she was asked to come into office today. Patient believes it is siatica     HPI:  Pt is a 68 y.o. female seen today for an acute visit for evaluation of worsening lower back pain since last seen 11/07/18 with lower back pain that started 11/06/2018 that occurred while she was putting weather stripping on her door bending over instead of stooping hurting her low back.she states pain now radiate down to her left leg.Pain worst with walking,sitting or moving.she denies any numbness or tingling on the legs.she has applied lidocaine 4 % patch and taken OTC medication without any relief.she states heat application reliefs pain but as soon as she takes off pain returns.No loss of bowel control or incontinency.she denies any fever or chills. He blood pressure 180/100 this visit.she denies any headache,dizziness,nausea,vomiting or shortness of breath.   Past Medical History:  Diagnosis Date  . Cervical radiculopathy   . Fatigue   . Fatigue   . Generalized osteoarthrosis   . Generalized osteoarthrosis   . Hyperlipidemia   . Lateral  epicondylitis of left elbow 06/28/2017  . Left knee pain   . Menopause   . Neck pain   . Pre-diabetes   . Swelling of left knee joint    Past Surgical History:  Procedure Laterality Date  . ABCESS DRAINAGE Left 09/15/2017   behind tonsil  . VAGINAL HYSTERECTOMY  1983    No Known Allergies  Outpatient Encounter Medications as of 11/14/2018  Medication Sig  . acetaminophen (TYLENOL) 500 MG tablet Take 2 tablets (1,000 mg total) by mouth 2 (two) times daily for 30 days. Take additional 500 mg tablet at 2 pm as needed.  . Calcium Citrate-Vitamin D (CALCIUM CITRATE + D PO) Take 1 tablet by mouth daily.  . diphenhydrAMINE (BENADRYL) 25 MG tablet Take 25 mg by mouth as needed.   . fluticasone (FLONASE) 50 MCG/ACT nasal spray SHAKE LIQUID AND USE 1 SPRAY IN EACH NOSTRIL TWICE DAILY  . ibuprofen (ADVIL,MOTRIN) 200 MG tablet Take 200 mg by mouth daily as needed.  . Lidocaine 4 % PTCH Apply 1 application topically daily for 14 days.  . Omega-3 Fatty Acids (FISH OIL PO) Take 1 capsule by mouth daily.  . rosuvastatin (CRESTOR) 20 MG tablet Take 1 tablet (20 mg total) by mouth daily.  . vitamin E 400 UNIT capsule Take 400 Units by mouth 2 (two) times daily.   No facility-administered encounter medications on file as of 11/14/2018.     Review of Systems  Constitutional: Negative for chills, fatigue, fever and unexpected  weight change.  Cardiovascular: Negative for chest pain, palpitations and leg swelling.  Gastrointestinal: Negative for abdominal distention, abdominal pain, constipation, diarrhea, nausea and vomiting.  Genitourinary: Negative for dysuria, flank pain, frequency and pelvic pain.  Musculoskeletal: Positive for back pain.       Lower back pain with radiation to left leg  Neurological: Negative for dizziness, weakness, light-headedness, numbness and headaches.    Immunization History  Administered Date(s) Administered  . Zoster Recombinat (Shingrix) 03/19/2017, 07/19/2017    Pertinent  Health Maintenance Due  Topic Date Due  . FOOT EXAM  08/12/1959  . OPHTHALMOLOGY EXAM  08/12/1959  . COLONOSCOPY  08/12/1999  . URINE MICROALBUMIN  10/14/2017  . INFLUENZA VACCINE  05/19/2018  . HEMOGLOBIN A1C  07/08/2018  . PNA vac Low Risk Adult (1 of 2 - PCV13) 12/29/2018 (Originally 08/11/2014)  . MAMMOGRAM  02/16/2020  . DEXA SCAN  Completed   Fall Risk  11/14/2018 11/07/2018 01/12/2018 01/05/2018 12/30/2017  Falls in the past year? 0 0 No No No  Number falls in past yr: 0 0 - - -  Injury with Fall? 0 0 - - -    Vitals:   11/14/18 1412  BP: (!) 180/100  Pulse: 98  Temp: 98 F (36.7 C)  SpO2: 97%  Weight: 139 lb 6.4 oz (63.2 kg)  Height: 5\' 7"  (1.702 m)   Body mass index is 21.83 kg/m. Physical Exam Vitals signs reviewed.  Constitutional:      Appearance: She is normal weight.     Comments: In acute distress due to low back pain  HENT:     Head: Normocephalic.     Mouth/Throat:     Mouth: Mucous membranes are moist.     Pharynx: Oropharynx is clear. No oropharyngeal exudate or posterior oropharyngeal erythema.  Eyes:     General: No scleral icterus.       Right eye: No discharge.        Left eye: No discharge.     Conjunctiva/sclera: Conjunctivae normal.     Pupils: Pupils are equal, round, and reactive to light.  Neck:     Musculoskeletal: Normal range of motion. No neck rigidity or muscular tenderness.  Cardiovascular:     Rate and Rhythm: Normal rate and regular rhythm.     Pulses: Normal pulses.     Heart sounds: No murmur. No friction rub. No gallop.   Pulmonary:     Effort: Pulmonary effort is normal. No respiratory distress.     Breath sounds: Normal breath sounds. No wheezing, rhonchi or rales.  Abdominal:     General: Bowel sounds are normal. There is no distension.     Palpations: Abdomen is soft. There is no mass.     Tenderness: There is no abdominal tenderness. There is no right CVA tenderness, left CVA tenderness, guarding or  rebound.  Musculoskeletal:        General: No swelling.     Right lower leg: No edema.     Left lower leg: No edema.     Comments: Limited ROM to left leg patient guarding leg due to pain.unable to assess for straight leg raise.   Lymphadenopathy:     Cervical: No cervical adenopathy.  Skin:    General: Skin is warm and dry.     Coloration: Skin is not pale.     Findings: No erythema.  Neurological:     Mental Status: She is oriented to person, place, and time.  Sensory: No sensory deficit.     Motor: No weakness.  Psychiatric:        Mood and Affect: Affect is tearful.        Speech: Speech normal.        Behavior: Behavior is cooperative.        Thought Content: Thought content normal.        Judgment: Judgment normal.    Labs reviewed: Recent Labs    01/05/18 0816  NA 138  K 4.5  CL 104  CO2 31  GLUCOSE 100*  BUN 16  CREATININE 0.80  CALCIUM 9.6   Recent Labs    01/05/18 0816  AST 14  ALT 10  BILITOT 0.3  PROT 6.8   Recent Labs    01/05/18 0816  WBC 5.2  NEUTROABS 1,622  HGB 12.2  HCT 37.1  MCV 81.2  PLT 281   Lab Results  Component Value Date   TSH 1.34 10/14/2016   Lab Results  Component Value Date   HGBA1C 6.7 (H) 01/05/2018   Lab Results  Component Value Date   CHOL 309 (H) 01/05/2018   HDL 58 01/05/2018   LDLCALC 227 (H) 01/05/2018   TRIG 108 01/05/2018   CHOLHDL 5.3 (H) 01/05/2018    Significant Diagnostic Results in last 30 days:  Dg Lumbar Spine Complete  Result Date: 11/07/2018 CLINICAL DATA:  68 year old female with pain and soreness and warm to touch after bending applying weather stripping. Low back pain extends to both hips and is worse with sitting. Initial encounter. EXAM: LUMBAR SPINE - COMPLETE 4+ VIEW COMPARISON:  None. FINDINGS: Normal alignment without compression fracture or pars defect. Mild L2-3 disc space narrowing. Mild L4-5 and L5-S1 facet degenerative changes. Minimal Schmorl's node deformity inferior endplate  L5. P29-51 mild to moderate disc space narrowing with anterior osteophyte. T12-L1 mild disc space narrowing with anterior osteophyte. Trace vascular calcifications. IMPRESSION: 1. Mild L2-3 disc space narrowing. 2. Mild L4-5 and L5-S1 facet degenerative changes. 3. Minimal Schmorl's node deformity inferior endplate L5. 4. O84-16 mild to moderate disc space narrowing. 5. T12-L1 mild disc space narrowing. 6. Aortic Atherosclerosis (ICD10-I70.0). Trace aortic calcification. Electronically Signed   By: Genia Del M.D.   On: 11/07/2018 10:55    Assessment/Plan 1. Lumbar pain with radiation down left leg Acute distress due to severe lower back pain with radiation to left leg.Unable to assess straight left leg raise patient guarding leg due to pain.  - Tordal 30 mg inject I.M X 1 dose  - Send to ED via EMS for further evaluation of lower back pain with radiation to left leg.Patient in agreement.  2. Elevated blood pressure reading No chest pain,shortness of breath,headache or dizziness.B/P 180/100 this visit possible due to acute lower back pain with radiation to left leg.Tordal 30 mg I.m x 1 dose given.B/p not rechecked EMS took patient to ED for further evaluation.   Family/ staff Communication: Reviewed plan of care with patient.   Labs/tests ordered: Send to ED via EMS for further evaluation of severe lower back pain with radiation to left leg.  Sandrea Hughs, NP

## 2018-11-14 NOTE — Discharge Instructions (Addendum)
If you develop worsening, recurrent, or continued back pain, numbness or weakness in the legs, incontinence of your bowels or bladders, numbness of your buttocks, fever, abdominal pain, or any other new/concerning symptoms then return to the ER for evaluation.   You are being prescribed hydrocodone/acetaminophen. Be careful not to take more than 4000 mg of tylenol per day. Do not take this medicine combined with others, or with alcohol. Do not drive or operate heavy machinery while on this medicine

## 2018-11-15 ENCOUNTER — Telehealth: Payer: Self-pay | Admitting: Family

## 2018-11-15 NOTE — Telephone Encounter (Signed)
Per pt from ED visit on 11/14/18, pts discharge info states for her to reach out to PCP for outpt MRI order.  She wants order for imaging but not appt to come into office. Please advise...  Thanks,  Vilinda Blanks.

## 2018-11-15 NOTE — Telephone Encounter (Signed)
Called patient and discussed [name]'s recommendations. Patient stated she was not ready to see an orthopedic doctor and would call back when she was ready.

## 2018-11-15 NOTE — Telephone Encounter (Signed)
Recommend referral to orthopedic for further evaluation of lower back pain with left leg radiation then will determine if imaging indicated.

## 2018-11-16 ENCOUNTER — Ambulatory Visit (INDEPENDENT_AMBULATORY_CARE_PROVIDER_SITE_OTHER): Payer: Medicare HMO | Admitting: Family

## 2018-11-16 ENCOUNTER — Encounter: Payer: Self-pay | Admitting: Family

## 2018-11-16 ENCOUNTER — Telehealth: Payer: Self-pay | Admitting: *Deleted

## 2018-11-16 VITALS — BP 132/78 | HR 65 | Temp 98.2°F | Ht 67.0 in | Wt 140.6 lb

## 2018-11-16 DIAGNOSIS — M79605 Pain in left leg: Secondary | ICD-10-CM

## 2018-11-16 DIAGNOSIS — M545 Low back pain, unspecified: Secondary | ICD-10-CM

## 2018-11-16 MED ORDER — PREDNISONE 10 MG PO TABS: 10.0000 mg | ORAL_TABLET | Freq: Every day | ORAL | 0 refills | Status: AC

## 2018-11-16 NOTE — Patient Instructions (Addendum)
1. Start on Prednisone 10 mg tablet tapered as directed. 2. Follow up with Orthopedic referral placed will call you for appointment.   3. Hold Ibuprofen/aleve while taking prednisone.   Sciatica  Sciatica is pain, numbness, weakness, or tingling along your sciatic nerve. The sciatic nerve starts in the lower back and goes down the back of each leg. Sciatica happens when this nerve is pinched or has pressure put on it. Sciatica usually goes away on its own or with treatment. Sometimes, sciatica may keep coming back (recur). Follow these instructions at home: Medicines  Take over-the-counter and prescription medicines only as told by your doctor.  Do not drive or use heavy machinery while taking prescription pain medicine. Managing pain  If directed, put ice on the affected area. ? Put ice in a plastic bag. ? Place a towel between your skin and the bag. ? Leave the ice on for 20 minutes, 2-3 times a day.  After icing, apply heat to the affected area before you exercise or as often as told by your doctor. Use the heat source that your doctor tells you to use, such as a moist heat pack or a heating pad. ? Place a towel between your skin and the heat source. ? Leave the heat on for 20-30 minutes. ? Remove the heat if your skin turns bright red. This is especially important if you are unable to feel pain, heat, or cold. You may have a greater risk of getting burned. Activity  Return to your normal activities as told by your doctor. Ask your doctor what activities are safe for you. ? Avoid activities that make your sciatica worse.  Take short rests during the day. Rest in a lying or standing position. This is usually better than sitting to rest. ? When you rest for a long time, do some physical activity or stretching between periods of rest. ? Avoid sitting for a long time without moving. Get up and move around at least one time each hour.  Exercise and stretch regularly, as told by your  doctor.  Do not lift anything that is heavier than 10 lb (4.5 kg) while you have symptoms of sciatica. ? Avoid lifting heavy things even when you do not have symptoms. ? Avoid lifting heavy things over and over.  When you lift objects, always lift in a way that is safe for your body. To do this, you should: ? Bend your knees. ? Keep the object close to your body. ? Avoid twisting. General instructions  Use good posture. ? Avoid leaning forward when you are sitting. ? Avoid hunching over when you are standing.  Stay at a healthy weight.  Wear comfortable shoes that support your feet. Avoid wearing high heels.  Avoid sleeping on a mattress that is too soft or too hard. You might have less pain if you sleep on a mattress that is firm enough to support your back.  Keep all follow-up visits as told by your doctor. This is important. Contact a doctor if:  You have pain that: ? Wakes you up when you are sleeping. ? Gets worse when you lie down. ? Is worse than the pain you have had in the past. ? Lasts longer than 4 weeks.  You lose weight for without trying. Get help right away if:  You cannot control when you pee (urinate) or poop (have a bowel movement).  You have weakness in any of these areas and it gets worse. ? Lower back. ?  Lower belly (pelvis). ? Butt (buttocks). ? Legs.  You have redness or swelling of your back.  You have a burning feeling when you pee. This information is not intended to replace advice given to you by your health care provider. Make sure you discuss any questions you have with your health care provider. Document Released: 07/14/2008 Document Revised: 03/12/2016 Document Reviewed: 06/14/2015 Elsevier Interactive Patient Education  2019 [employer].

## 2018-11-16 NOTE — Telephone Encounter (Signed)
Patient called and scheduled appointment to follow up with [name] today. [name] will discuss recommendations with her further.

## 2018-11-16 NOTE — Progress Notes (Signed)
Provider: Ziyon Soltau FNP-C  [name], NP  Patient Care Team: [name], NP as PCP - General (Geriatric Medicine)  Extended Emergency Contact Information Primary Emergency Contact: Yodice,[name] Address: [address]          [address]          [place] [address] Johnnette Litter of [place] Phone: [phone] Relation: Daughter   Goals of care: Advanced Directive information Advanced Directives 11/16/2018  Does Patient Have a Medical Advance Directive? No  Would patient like information on creating a medical advance directive? No - Patient declined     Chief Complaint  Patient presents with  . Follow-up    Follow up from ED patient states that she is walking better and pain is usually worse in the morning trying to get out of bed   . other    Patient states was able to move better taking advil, aleve, and hydrocodone and using thermacare heat wrap     HPI:  Pt is a 68 y.o. female seen today for an acute visit for follow up ED visit 11/14/2018 for acute left sided lower back pain.Left hip X-ray was negative.she was discharge on Hydrocodone-APAP 5/-325 mg tablet one by mouth every 6 hours as needed.Outpatient non-emergent MRI recommended.She states has also taken ibuprofen,Aleve and has used Biofreeze, wrap heating with some relief but pain returns when medication wears off.Pain has been worst when she gets up in the morning has to slid off the bed.she denies any numbness,tingling or weakness of leg.she is unable to sit during exam seen standing in the room.     Past Medical History:  Diagnosis Date  . Cervical radiculopathy   . Fatigue   . Fatigue   . Generalized osteoarthrosis   . Generalized osteoarthrosis   . Hyperlipidemia   . Lateral epicondylitis of left elbow 06/28/2017  . Left knee pain   . Menopause   . Neck pain   . Pre-diabetes   . Swelling of left knee joint    Past Surgical History:  Procedure Laterality Date  . ABCESS DRAINAGE  Left 09/15/2017   behind tonsil  . VAGINAL HYSTERECTOMY  1983    No Known Allergies  Outpatient Encounter Medications as of 11/16/2018  Medication Sig  . acetaminophen (TYLENOL) 500 MG tablet Take 2 tablets (1,000 mg total) by mouth 2 (two) times daily for 30 days. Take additional 500 mg tablet at 2 pm as needed.  . Calcium Citrate-Vitamin D (CALCIUM CITRATE + D PO) Take 1 tablet by mouth daily.  . diphenhydrAMINE (BENADRYL) 25 MG tablet Take 25 mg by mouth as needed.   . fluticasone (FLONASE) 50 MCG/ACT nasal spray SHAKE LIQUID AND USE 1 SPRAY IN EACH NOSTRIL TWICE DAILY  . HYDROcodone-acetaminophen (NORCO) 5-325 MG tablet Take 1 tablet by mouth every 6 (six) hours as needed for severe pain.  Marland Kitchen ibuprofen (ADVIL,MOTRIN) 200 MG tablet Take 200 mg by mouth daily as needed.  . Lidocaine 4 % PTCH Apply 1 application topically daily for 14 days.  . Omega-3 Fatty Acids (FISH OIL PO) Take 1 capsule by mouth daily.  . rosuvastatin (CRESTOR) 20 MG tablet Take 1 tablet (20 mg total) by mouth daily.  . vitamin E 400 UNIT capsule Take 400 Units by mouth 2 (two) times daily.   No facility-administered encounter medications on file as of 11/16/2018.     Review of Systems  Constitutional: Negative for chills, fatigue, fever and unexpected weight change.  Respiratory: Negative  for cough, chest tightness, shortness of breath and wheezing.   Cardiovascular: Negative for chest pain, palpitations and leg swelling.  Gastrointestinal: Negative for abdominal distention, abdominal pain, constipation, diarrhea, nausea and vomiting.  Genitourinary: Negative for dysuria, flank pain, frequency and urgency.  Musculoskeletal: Positive for back pain. Negative for gait problem.       Lower back pain with radiation to left hip/leg.   Skin: Negative for color change, pallor and rash.  Neurological: Negative for dizziness, weakness, light-headedness, numbness and headaches.    Immunization History  Administered  Date(s) Administered  . Zoster Recombinat (Shingrix) 03/19/2017, 07/19/2017   Pertinent  Health Maintenance Due  Topic Date Due  . FOOT EXAM  08/12/1959  . OPHTHALMOLOGY EXAM  08/12/1959  . COLONOSCOPY  08/12/1999  . URINE MICROALBUMIN  10/14/2017  . INFLUENZA VACCINE  05/19/2018  . HEMOGLOBIN A1C  07/08/2018  . PNA vac Low Risk Adult (1 of 2 - PCV13) 12/29/2018 (Originally 08/11/2014)  . MAMMOGRAM  02/16/2020  . DEXA SCAN  Completed   Fall Risk  11/16/2018 11/14/2018 11/07/2018 01/12/2018 01/05/2018  Falls in the past year? 0 0 0 No No  Number falls in past yr: 0 0 0 - -  Injury with Fall? 0 0 0 - -    Vitals:   11/16/18 1035  BP: 132/78  Pulse: 65  Temp: 98.2 F (36.8 C)  TempSrc: Oral  SpO2: 92%  Weight: 140 lb 9.6 oz (63.8 kg)  Height: 5\' 7"  (1.702 m)   Body mass index is 22.02 kg/m. Physical Exam Constitutional:      General: She is not in acute distress.    Appearance: She is normal weight.  HENT:     Mouth/Throat:     Mouth: Mucous membranes are moist.     Pharynx: Oropharynx is clear. No oropharyngeal exudate or posterior oropharyngeal erythema.  Eyes:     General: No scleral icterus.       Right eye: No discharge.        Left eye: No discharge.     Conjunctiva/sclera: Conjunctivae normal.     Pupils: Pupils are equal, round, and reactive to light.  Cardiovascular:     Rate and Rhythm: Normal rate and regular rhythm.     Pulses: Normal pulses.     Heart sounds: Normal heart sounds. No murmur. No friction rub. No gallop.   Pulmonary:     Effort: Pulmonary effort is normal. No respiratory distress.     Breath sounds: Normal breath sounds. No wheezing, rhonchi or rales.  Chest:     Chest wall: No tenderness.  Abdominal:     General: Bowel sounds are normal. There is no distension.     Palpations: Abdomen is soft. There is no mass.     Tenderness: There is no abdominal tenderness. There is no right CVA tenderness, left CVA tenderness or guarding.   Musculoskeletal:     Right lower leg: No edema.     Left lower leg: No edema.     Comments: FROM except Left leg limited by due to pain.Left lower back tenderness to deep palpitation.  Skin:    General: Skin is warm and dry.     Coloration: Skin is not pale.     Findings: No erythema, lesion or rash.  Neurological:     Mental Status: She is alert and oriented to person, place, and time.     Cranial Nerves: No cranial nerve deficit.     Sensory: No sensory  deficit.     Motor: No weakness.     Coordination: Coordination normal.  Psychiatric:        Mood and Affect: Mood normal.        Behavior: Behavior normal.        Thought Content: Thought content normal.        Judgment: Judgment normal.    Labs reviewed: Recent Labs    01/05/18 0816  NA 138  K 4.5  CL 104  CO2 31  GLUCOSE 100*  BUN 16  CREATININE 0.80  CALCIUM 9.6   Recent Labs    01/05/18 0816  AST 14  ALT 10  BILITOT 0.3  PROT 6.8   Recent Labs    01/05/18 0816  WBC 5.2  NEUTROABS 1,622  HGB 12.2  HCT 37.1  MCV 81.2  PLT 281   Lab Results  Component Value Date   TSH 1.34 10/14/2016   Lab Results  Component Value Date   HGBA1C 6.7 (H) 01/05/2018   Lab Results  Component Value Date   CHOL 309 (H) 01/05/2018   HDL 58 01/05/2018   LDLCALC 227 (H) 01/05/2018   TRIG 108 01/05/2018   CHOLHDL 5.3 (H) 01/05/2018    Significant Diagnostic Results in last 30 days:  Dg Lumbar Spine Complete  Result Date: 11/07/2018 CLINICAL DATA:  68 year old female with pain and soreness and warm to touch after bending applying weather stripping. Low back pain extends to both hips and is worse with sitting. Initial encounter. EXAM: LUMBAR SPINE - COMPLETE 4+ VIEW COMPARISON:  None. FINDINGS: Normal alignment without compression fracture or pars defect. Mild L2-3 disc space narrowing. Mild L4-5 and L5-S1 facet degenerative changes. Minimal Schmorl's node deformity inferior endplate L5. W11-91 mild to moderate disc  space narrowing with anterior osteophyte. T12-L1 mild disc space narrowing with anterior osteophyte. Trace vascular calcifications. IMPRESSION: 1. Mild L2-3 disc space narrowing. 2. Mild L4-5 and L5-S1 facet degenerative changes. 3. Minimal Schmorl's node deformity inferior endplate L5. 4. Y78-29 mild to moderate disc space narrowing. 5. T12-L1 mild disc space narrowing. 6. Aortic Atherosclerosis (ICD10-I70.0). Trace aortic calcification. Electronically Signed   By: Genia Del M.D.   On: 11/07/2018 10:55   Dg Hip Unilat W Or Wo Pelvis 2-3 Views Left  Result Date: 11/14/2018 CLINICAL DATA:  67 y/o F; 1 day of left leg radiculopathy and history of sciatica. EXAM: DG HIP (WITH OR WITHOUT PELVIS) 2-3V LEFT COMPARISON:  None. FINDINGS: There is no evidence of hip fracture or dislocation. There is no evidence of arthropathy or other focal bone abnormality. IMPRESSION: Negative. Electronically Signed   By: Kristine Garbe M.D.   On: 11/14/2018 17:20   Assessment/Plan   Lumbar pain with radiation down left leg Lower back tender to palpation.unable to assess straight raise leg patient standing during visit.No numbness,tingling or weakness.  - continue on Hydrocodone-APAP as directed in ED and Heat wrap.Hold aleve/ibuprofen while on prednisone. - Taper prednisone 10 mg tablet take 2 tablets by mouth daily x 3 days then one tablet by mouth daily x 3 days then 1/2 tablet (5 mg ) by mouth daily x 3 days then stop. - Ambulatory referral to Orthopedic  - Notify provider office or go to ED if symptoms worsen or experience any numbness,tingling,weakness of leg or urine/bowel incontinence.    Family/ staff Communication: Reviewed plan of care with patient.  Labs/tests ordered: None   Alanta Scobey C Esli Clements, NP

## 2018-11-16 NOTE — Telephone Encounter (Signed)
Pharmacy called and stated that the #of Predisone didn't go with the directions. Only #9 given and she needs 11.

## 2018-11-29 ENCOUNTER — Ambulatory Visit (INDEPENDENT_AMBULATORY_CARE_PROVIDER_SITE_OTHER): Payer: Medicare HMO | Admitting: Family Medicine

## 2018-11-30 ENCOUNTER — Encounter: Payer: Self-pay | Admitting: Family

## 2019-01-05 ENCOUNTER — Telehealth: Payer: Self-pay | Admitting: *Deleted

## 2019-01-05 NOTE — Telephone Encounter (Signed)
If she has already been isolated for 14 days and is without symptoms and driving it should be okay for her to come stay.

## 2019-01-05 NOTE — Telephone Encounter (Signed)
Patient called and stated that her daughter from [place] is wanting to come and stay with her  through this virus. Stated daughter doesn't want her to be alone.  Stated that she has been isolated at her home in [place] once the virus started. Stated that she is not showing any symptoms. She is just wanting to make sure it is ok for her to travel here and stay with her. She is wanting your opinion.  Please advise.

## 2019-01-05 NOTE — Telephone Encounter (Signed)
Patient notified and agreed.  

## 2019-01-06 ENCOUNTER — Encounter: Payer: Self-pay | Admitting: Nurse Practitioner

## 2019-01-09 ENCOUNTER — Encounter: Payer: Self-pay | Admitting: Family

## 2019-01-09 ENCOUNTER — Ambulatory Visit: Payer: Self-pay

## 2019-01-10 ENCOUNTER — Encounter: Payer: Self-pay | Admitting: Nurse Practitioner

## 2019-01-10 ENCOUNTER — Encounter: Payer: Self-pay | Admitting: Family

## 2019-01-11 ENCOUNTER — Encounter: Payer: Self-pay | Admitting: Nurse Practitioner

## 2019-02-10 ENCOUNTER — Ambulatory Visit (INDEPENDENT_AMBULATORY_CARE_PROVIDER_SITE_OTHER): Payer: Medicare HMO | Admitting: Nurse Practitioner

## 2019-02-10 ENCOUNTER — Encounter: Payer: Self-pay | Admitting: Nurse Practitioner

## 2019-02-10 ENCOUNTER — Other Ambulatory Visit: Payer: Self-pay

## 2019-02-10 DIAGNOSIS — R928 Other abnormal and inconclusive findings on diagnostic imaging of breast: Secondary | ICD-10-CM | POA: Insufficient documentation

## 2019-02-10 DIAGNOSIS — Z0000 Encounter for general adult medical examination without abnormal findings: Secondary | ICD-10-CM

## 2019-02-10 NOTE — Progress Notes (Signed)
Subjective:   Emily Hayes is a 68 y.o. female who presents for Medicare Annual (Subsequent) preventive examination.  Review of Systems:   Cardiac Risk Factors include: advanced age (>40men, >84 women);dyslipidemia;smoking/ tobacco exposure     Objective:     Vitals: There were no vitals taken for this visit.  There is no height or weight on file to calculate BMI.  Advanced Directives 02/10/2019 11/16/2018 11/14/2018 01/05/2018  Does Patient Have a Medical Advance Directive? Yes No No No  Type of Advance Directive Living will - - -  Would patient like information on creating a medical advance directive? - No - Patient declined No - Patient declined Yes (MAU/Ambulatory/Procedural Areas - Information given)    Tobacco Social History   Tobacco Use  Smoking Status Current Every Day Smoker  . Packs/day: 0.25  . Years: 50.00  . Pack years: 12.50  . Types: Cigarettes  Smokeless Tobacco Never Used     Ready to quit: Not Answered Counseling given: Not Answered   Clinical Intake:     Pain : No/denies pain     BMI - recorded: 22.02 Nutritional Status: BMI of 19-24  Normal Nutritional Risks: None Diabetes: No  How often do you need to have someone help you when you read instructions, pamphlets, or other written materials from your doctor or pharmacy?: 1 - Never What is the last grade level you completed in school?: 2 years of college  Interpreter Needed?: No     Past Medical History:  Diagnosis Date  . Cervical radiculopathy   . Fatigue   . Fatigue   . Generalized osteoarthrosis   . Generalized osteoarthrosis   . Hyperlipidemia   . Lateral epicondylitis of left elbow 06/28/2017  . Left knee pain   . Menopause   . Neck pain   . Pre-diabetes   . Swelling of left knee joint    Past Surgical History:  Procedure Laterality Date  . ABCESS DRAINAGE Left 09/15/2017   behind tonsil  . VAGINAL HYSTERECTOMY  1983   Family History  Problem Relation Age of Onset   . Stroke Mother   . Diabetes Mother   . Dementia Mother   . Cancer Sister        unknown  . Cancer Sister        unknown  . Kidney failure Brother   . Cancer Brother   . Cancer Brother    Social History   Socioeconomic History  . Marital status: Divorced    Spouse name: Not on file  . Number of children: Not on file  . Years of education: Not on file  . Highest education level: Not on file  Occupational History  . Not on file  Social Needs  . Financial resource strain: Not hard at all  . Food insecurity:    Worry: Never true    Inability: Never true  . Transportation needs:    Medical: No    Non-medical: No  Tobacco Use  . Smoking status: Current Every Day Smoker    Packs/day: 0.25    Years: 50.00    Pack years: 12.50    Types: Cigarettes  . Smokeless tobacco: Never Used  Substance and Sexual Activity  . Alcohol use: No    Frequency: Never  . Drug use: No  . Sexual activity: Not on file  Lifestyle  . Physical activity:    Days per week: 3 days    Minutes per session: 20 min  . Stress: Only  a little  Relationships  . Social connections:    Talks on phone: More than three times a week    Gets together: Once a week    Attends religious service: Not on file    Active member of club or organization: No    Attends meetings of clubs or organizations: Never    Relationship status: Divorced  Other Topics Concern  . Not on file  Social History Narrative   Social History      Diet? none      Do you drink/eat things with caffeine? Chocolate- occasionally      Marital status?        divorced                            What year were you married? 1982      Do you live in a house, apartment, assisted living, condo, trailer, etc.? apartment      Is it one or more stories? 1      How many persons live in your home? 1       Do you have any pets in your home? (please list) yes, dog      Highest level of education completed? Associate degree- college       Current or past profession: retired      Do you exercise?           yes                           Type & how often? Walk- daily      Advanced Directives      Do you have a living will? yes      Do you have a DNR form?     yes                             If not, do you want to discuss one? yes      Do you have signed POA/HPOA for forms? no      Functional Status      Do you have difficulty bathing or dressing yourself?      Do you have difficulty preparing food or eating?       Do you have difficulty managing your medications?      Do you have difficulty managing your finances?      Do you have difficulty affording your medications?    Outpatient Encounter Medications as of 02/10/2019  Medication Sig  . Calcium Citrate-Vitamin D (CALCIUM CITRATE + D PO) Take 1 tablet by mouth daily.  . diphenhydrAMINE (BENADRYL) 25 MG tablet Take 25 mg by mouth as needed.   . fluticasone (FLONASE) 50 MCG/ACT nasal spray SHAKE LIQUID AND USE 1 SPRAY IN EACH NOSTRIL TWICE DAILY  . ibuprofen (ADVIL,MOTRIN) 200 MG tablet Take 200 mg by mouth daily as needed.  . Misc Natural Products (COLON CLEANSE) CAPS Take by mouth. Once every 2 weeks  . Omega-3 Fatty Acids (FISH OIL PO) Take 1 capsule by mouth daily.  . vitamin E 400 UNIT capsule Take 400 Units by mouth 2 (two) times daily.  . [DISCONTINUED] HYDROcodone-acetaminophen (NORCO) 5-325 MG tablet Take 1 tablet by mouth every 6 (six) hours as needed for severe pain.  . [DISCONTINUED] rosuvastatin (CRESTOR) 20 MG tablet Take 1 tablet (20 mg total) by mouth daily. (  Patient not taking: Reported on 02/10/2019)   No facility-administered encounter medications on file as of 02/10/2019.     Activities of Daily Living In your present state of health, do you have any difficulty performing the following activities: 02/10/2019  Hearing? N  Vision? N  Difficulty concentrating or making decisions? N  Walking or climbing stairs? N  Dressing or bathing? N  Doing  errands, shopping? N  Preparing Food and eating ? N  Using the Toilet? N  In the past six months, have you accidently leaked urine? N  Do you have problems with loss of bowel control? N  Managing your Medications? N  Managing your Finances? N  Housekeeping or managing your Housekeeping? N  Some recent data might be hidden    Patient Care Team: [name], NP as PCP - General (Geriatric Medicine)    Assessment:   This is a routine wellness examination for [place].  Exercise Activities and Dietary recommendations Current Exercise Habits: Home exercise routine, Type of exercise: walking;stretching, Time (Minutes): 45, Frequency (Times/Week): 7, Weekly Exercise (Minutes/Week): 315, Intensity: Moderate  Goals    . Quit Smoking     Would like to decrease smoking (currently smoking 1 pack in 2 days) but not quit       Fall Risk Fall Risk  02/10/2019 11/16/2018 11/14/2018 11/07/2018 01/12/2018  Falls in the past year? 0 0 0 0 No  Number falls in past yr: 0 0 0 0 -  Injury with Fall? 0 0 0 0 -   Is the patient's home free of loose throw rugs in walkways, pet beds, electrical cords, etc?   yes      Grab bars in the bathroom? no      Handrails on the stairs?   No stairs      Adequate lighting?   yes  Timed Get Up and Go performed: na  Depression Screen PHQ 2/9 Scores 02/10/2019 01/12/2018 01/05/2018 12/28/2017  PHQ - 2 Score 0 0 0 0     Cognitive Function MMSE - Mini Mental State Exam 01/05/2018  Orientation to time 5  Orientation to Place 5  Registration 3  Attention/ Calculation 5  Recall 3  Language- name 2 objects 2  Language- repeat 1  Language- follow 3 step command 3  Language- read & follow direction 1  Write a sentence 1  Copy design 1  Total score 30     6CIT Screen 02/10/2019  What Year? 0 points  What month? 0 points  What time? 0 points  Count back from 20 0 points  Months in reverse 0 points  Repeat phrase 2 points  Total Score 2    Immunization  History  Administered Date(s) Administered  . Zoster Recombinat (Shingrix) 03/19/2017, 07/19/2017    Qualifies for Shingles Vaccine? yes  Screening Tests Health Maintenance  Topic Date Due  . Hepatitis C Screening  Nov 14, 1950  . FOOT EXAM  08/12/1959  . OPHTHALMOLOGY EXAM  08/12/1959  . COLONOSCOPY  08/12/1999  . URINE MICROALBUMIN  10/14/2017  . HEMOGLOBIN A1C  07/08/2018  . TETANUS/TDAP  02/10/2020 (Originally 08/11/1968)  . PNA vac Low Risk Adult (1 of 2 - PCV13) 02/10/2079 (Originally 08/11/2014)  . INFLUENZA VACCINE  05/20/2019  . MAMMOGRAM  02/16/2020  . DEXA SCAN  Completed    Cancer Screenings: Lung: Low Dose CT Chest recommended if Age 12-80 years, 30 pack-year currently smoking OR have quit w/in 15years. Patient does not qualify. Smoked 1/2 pack a day  for 30 years Breast:  Up to date on Mammogram? Yes   Up to date of Bone Density/Dexa? Yes  Colorectal: agreeable to cologuard.   Had abnormal mammogram but reports she does not want to get a biopsy, she would not do anything about it. Would not want treatment for cancer. "lived a good life and would not want treatment"    Additional Screenings: : Hepatitis C Screening: done.      Plan:      I have personally reviewed and noted the following in the patient's chart:   . Medical and social history . Use of alcohol, tobacco or illicit drugs  . Current medications and supplements . Functional ability and status . Nutritional status . Physical activity . Advanced directives . List of other physicians . Hospitalizations, surgeries, and ER visits in previous 12 months . Vitals . Screenings to include cognitive, depression, and falls . Referrals and appointments  In addition, I have reviewed and discussed with patient certain preventive protocols, quality metrics, and best practice recommendations. A written personalized care plan for preventive services as well as general preventive health recommendations were  provided to patient.     [name], NP  02/10/2019    Offered a follow up appt but pt decline any follow up for labs

## 2019-02-10 NOTE — Progress Notes (Signed)
    This service is provided via telemedicine  No vital signs collected/recorded due to the encounter was a telemedicine visit.   Location of patient (ex: home, work):  Home  Patient consents to a telephone visit:  Yes  Location of the provider (ex: office, home):  [facility], Office   Name of any referring provider:  N/A  Names of all persons participating in the telemedicine service and their role in the encounter: S.Chrae B/CMA, [name], NP, and Patient    Time spent on call:  13 min with medical assistant

## 2019-02-10 NOTE — Patient Instructions (Signed)
Emily Hayes , Thank you for taking time to come for your Medicare Wellness Visit. I appreciate your ongoing commitment to your health goals. Please review the following plan we discussed and let me know if I can assist you in the future.   Screening recommendations/referrals: Colonoscopy: agreeable for cologuard- we will send in information regarding this  Mammogram you have decline this. Bone Density up to date Recommended yearly ophthalmology/optometry visit for glaucoma screening and checkup Recommended yearly dental visit for hygiene and checkup  Vaccinations: Influenza vaccine declines vaccine Pneumococcal vaccine - declines  Tdap vaccine - declines Shingles vaccine up to date    Advanced directives: bring to office so we can have on file  Conditions/risks identified: recommend to update vaccines to prevent illness from pneumonia and influenza.   Next appointment: 1 year.    Preventive Care 13 Years and Older, Female Preventive care refers to lifestyle choices and visits with your health care provider that can promote health and wellness. What does preventive care include?  A yearly physical exam. This is also called an annual well check.  Dental exams once or twice a year.  Routine eye exams. Ask your health care provider how often you should have your eyes checked.  Personal lifestyle choices, including:  Daily care of your teeth and gums.  Regular physical activity.  Eating a healthy diet.  Avoiding tobacco and drug use.  Limiting alcohol use.  Practicing safe sex.  Taking low-dose aspirin every day.  Taking vitamin and mineral supplements as recommended by your health care provider. What happens during an annual well check? The services and screenings done by your health care provider during your annual well check will depend on your age, overall health, lifestyle risk factors, and family history of disease. Counseling  Your health care provider may ask  you questions about your:  Alcohol use.  Tobacco use.  Drug use.  Emotional well-being.  Home and relationship well-being.  Sexual activity.  Eating habits.  History of falls.  Memory and ability to understand (cognition).  Work and work Statistician.  Reproductive health. Screening  You may have the following tests or measurements:  Height, weight, and BMI.  Blood pressure.  Lipid and cholesterol levels. These may be checked every 5 years, or more frequently if you are over 41 years old.  Skin check.  Lung cancer screening. You may have this screening every year starting at age 55 if you have a 30-pack-year history of smoking and currently smoke or have quit within the past 15 years.  Fecal occult blood test (FOBT) of the stool. You may have this test every year starting at age 76.  Flexible sigmoidoscopy or colonoscopy. You may have a sigmoidoscopy every 5 years or a colonoscopy every 10 years starting at age 71.  Hepatitis C blood test.  Hepatitis B blood test.  Sexually transmitted disease (STD) testing.  Diabetes screening. This is done by checking your blood sugar (glucose) after you have not eaten for a while (fasting). You may have this done every 1-3 years.  Bone density scan. This is done to screen for osteoporosis. You may have this done starting at age 57.  Mammogram. This may be done every 1-2 years. Talk to your health care provider about how often you should have regular mammograms. Talk with your health care provider about your test results, treatment options, and if necessary, the need for more tests. Vaccines  Your health care provider may recommend certain vaccines, such as:  Influenza vaccine. This is recommended every year.  Tetanus, diphtheria, and acellular pertussis (Tdap, Td) vaccine. You may need a Td booster every 10 years.  Zoster vaccine. You may need this after age 41.  Pneumococcal 13-valent conjugate (PCV13) vaccine. One dose  is recommended after age 56.  Pneumococcal polysaccharide (PPSV23) vaccine. One dose is recommended after age 89. Talk to your health care provider about which screenings and vaccines you need and how often you need them. This information is not intended to replace advice given to you by your health care provider. Make sure you discuss any questions you have with your health care provider. Document Released: 11/01/2015 Document Revised: 06/24/2016 Document Reviewed: 08/06/2015 Elsevier Interactive Patient Education  2017 [place] Prevention in the Home Falls can cause injuries. They can happen to people of all ages. There are many things you can do to make your home safe and to help prevent falls. What can I do on the outside of my home?  Regularly fix the edges of walkways and driveways and fix any cracks.  Remove anything that might make you trip as you walk through a door, such as a raised step or threshold.  Trim any bushes or trees on the path to your home.  Use bright outdoor lighting.  Clear any walking paths of anything that might make someone trip, such as rocks or tools.  Regularly check to see if handrails are loose or broken. Make sure that both sides of any steps have handrails.  Any raised decks and porches should have guardrails on the edges.  Have any leaves, snow, or ice cleared regularly.  Use sand or salt on walking paths during winter.  Clean up any spills in your garage right away. This includes oil or grease spills. What can I do in the bathroom?  Use night lights.  Install grab bars by the toilet and in the tub and shower. Do not use towel bars as grab bars.  Use non-skid mats or decals in the tub or shower.  If you need to sit down in the shower, use a plastic, non-slip stool.  Keep the floor dry. Clean up any water that spills on the floor as soon as it happens.  Remove soap buildup in the tub or shower regularly.  Attach bath mats  securely with double-sided non-slip rug tape.  Do not have throw rugs and other things on the floor that can make you trip. What can I do in the bedroom?  Use night lights.  Make sure that you have a light by your bed that is easy to reach.  Do not use any sheets or blankets that are too big for your bed. They should not hang down onto the floor.  Have a firm chair that has side arms. You can use this for support while you get dressed.  Do not have throw rugs and other things on the floor that can make you trip. What can I do in the kitchen?  Clean up any spills right away.  Avoid walking on wet floors.  Keep items that you use a lot in easy-to-reach places.  If you need to reach something above you, use a strong step stool that has a grab bar.  Keep electrical cords out of the way.  Do not use floor polish or wax that makes floors slippery. If you must use wax, use non-skid floor wax.  Do not have throw rugs and other things on the floor that  can make you trip. What can I do with my stairs?  Do not leave any items on the stairs.  Make sure that there are handrails on both sides of the stairs and use them. Fix handrails that are broken or loose. Make sure that handrails are as long as the stairways.  Check any carpeting to make sure that it is firmly attached to the stairs. Fix any carpet that is loose or worn.  Avoid having throw rugs at the top or bottom of the stairs. If you do have throw rugs, attach them to the floor with carpet tape.  Make sure that you have a light switch at the top of the stairs and the bottom of the stairs. If you do not have them, ask someone to add them for you. What else can I do to help prevent falls?  Wear shoes that:  Do not have high heels.  Have rubber bottoms.  Are comfortable and fit you well.  Are closed at the toe. Do not wear sandals.  If you use a stepladder:  Make sure that it is fully opened. Do not climb a closed  stepladder.  Make sure that both sides of the stepladder are locked into place.  Ask someone to hold it for you, if possible.  Clearly mark and make sure that you can see:  Any grab bars or handrails.  First and last steps.  Where the edge of each step is.  Use tools that help you move around (mobility aids) if they are needed. These include:  Canes.  Walkers.  Scooters.  Crutches.  Turn on the lights when you go into a dark area. Replace any light bulbs as soon as they burn out.  Set up your furniture so you have a clear path. Avoid moving your furniture around.  If any of your floors are uneven, fix them.  If there are any pets around you, be aware of where they are.  Review your medicines with your doctor. Some medicines can make you feel dizzy. This can increase your chance of falling. Ask your doctor what other things that you can do to help prevent falls. This information is not intended to replace advice given to you by your health care provider. Make sure you discuss any questions you have with your health care provider. Document Released: 08/01/2009 Document Revised: 03/12/2016 Document Reviewed: 11/09/2014 Elsevier Interactive Patient Education  2017 [employer].

## 2019-02-22 DIAGNOSIS — Z1211 Encounter for screening for malignant neoplasm of colon: Secondary | ICD-10-CM | POA: Diagnosis not present

## 2019-02-22 DIAGNOSIS — Z1212 Encounter for screening for malignant neoplasm of rectum: Secondary | ICD-10-CM | POA: Diagnosis not present

## 2019-02-22 LAB — COLOGUARD: Cologuard: POSITIVE — AB

## 2019-03-02 ENCOUNTER — Encounter: Payer: Self-pay | Admitting: *Deleted

## 2019-03-03 ENCOUNTER — Telehealth: Payer: Self-pay

## 2019-03-03 DIAGNOSIS — R195 Other fecal abnormalities: Secondary | ICD-10-CM

## 2019-03-03 NOTE — Telephone Encounter (Signed)
Incoming fax received from cologuard with a positive result.  Per [name], NP cologuard was positive (abnormal finding). Patient to be referred to GI.  I called patient and informed her of results. Patient in agreement with referral to GI.   Order placed

## 2019-08-14 ENCOUNTER — Telehealth: Payer: Self-pay | Admitting: Nurse Practitioner

## 2019-08-14 ENCOUNTER — Ambulatory Visit: Payer: Medicare HMO | Admitting: Nurse Practitioner

## 2019-08-14 NOTE — Telephone Encounter (Signed)
Called Emily Hayes to reschedule appt that she missed and a lab appointment per Janett Billow. Patient states that she does not want to schedule either at this time.

## 2019-08-22 DIAGNOSIS — H43393 Other vitreous opacities, bilateral: Secondary | ICD-10-CM | POA: Diagnosis not present

## 2020-02-13 ENCOUNTER — Other Ambulatory Visit: Payer: Self-pay

## 2020-02-13 ENCOUNTER — Encounter: Payer: Self-pay | Admitting: Nurse Practitioner

## 2020-02-13 ENCOUNTER — Ambulatory Visit (INDEPENDENT_AMBULATORY_CARE_PROVIDER_SITE_OTHER): Payer: Medicare HMO | Admitting: Nurse Practitioner

## 2020-02-13 DIAGNOSIS — E2839 Other primary ovarian failure: Secondary | ICD-10-CM | POA: Diagnosis not present

## 2020-02-13 DIAGNOSIS — Z1231 Encounter for screening mammogram for malignant neoplasm of breast: Secondary | ICD-10-CM

## 2020-02-13 DIAGNOSIS — Z0000 Encounter for general adult medical examination without abnormal findings: Secondary | ICD-10-CM

## 2020-02-13 NOTE — Patient Instructions (Signed)
[name] , Thank you for taking time to come for your Medicare Wellness Visit. I appreciate your ongoing commitment to your health goals. Please review the following plan we discussed and let me know if I can assist you in the future.   Screening recommendations/referrals: Colonoscopy - RECOMMENDED due to positive cologuard Mammogram ordered today Bone Density ordered today Recommended yearly ophthalmology/optometry visit for glaucoma screening and checkup Recommended yearly dental visit for hygiene and checkup  Vaccinations: Influenza vaccine- you have declined this in the past Pneumococcal vaccine - RECOMMENDED, to get in office or at local pharmacy Tdap vaccine -RECOMMENDED to get at local pharmacy Shingles vaccine up to date    Advanced directives: recommended to complete and bring copy to next office   Conditions/risks identified: cardiovascular risk, cancer risk due to smoking.   Next appointment: next year   Preventive Care 71 Years and Older, Female Preventive care refers to lifestyle choices and visits with your health care provider that can promote health and wellness. What does preventive care include?  A yearly physical exam. This is also called an annual well check.  Dental exams once or twice a year.  Routine eye exams. Ask your health care provider how often you should have your eyes checked.  Personal lifestyle choices, including:  Daily care of your teeth and gums.  Regular physical activity.  Eating a healthy diet.  Avoiding tobacco and drug use.  Limiting alcohol use.  Practicing safe sex.  Taking low-dose aspirin every day.  Taking vitamin and mineral supplements as recommended by your health care provider. What happens during an annual well check? The services and screenings done by your health care provider during your annual well check will depend on your age, overall health, lifestyle risk factors, and family history of  disease. Counseling  Your health care provider may ask you questions about your:  Alcohol use.  Tobacco use.  Drug use.  Emotional well-being.  Home and relationship well-being.  Sexual activity.  Eating habits.  History of falls.  Memory and ability to understand (cognition).  Work and work Statistician.  Reproductive health. Screening  You may have the following tests or measurements:  Height, weight, and BMI.  Blood pressure.  Lipid and cholesterol levels. These may be checked every 5 years, or more frequently if you are over 39 years old.  Skin check.  Lung cancer screening. You may have this screening every year starting at age 2 if you have a 30-pack-year history of smoking and currently smoke or have quit within the past 15 years.  Fecal occult blood test (FOBT) of the stool. You may have this test every year starting at age 16.  Flexible sigmoidoscopy or colonoscopy. You may have a sigmoidoscopy every 5 years or a colonoscopy every 10 years starting at age 54.  Hepatitis C blood test.  Hepatitis B blood test.  Sexually transmitted disease (STD) testing.  Diabetes screening. This is done by checking your blood sugar (glucose) after you have not eaten for a while (fasting). You may have this done every 1-3 years.  Bone density scan. This is done to screen for osteoporosis. You may have this done starting at age 16.  Mammogram. This may be done every 1-2 years. Talk to your health care provider about how often you should have regular mammograms. Talk with your health care provider about your test results, treatment options, and if necessary, the need for more tests. Vaccines  Your health care provider may recommend certain  vaccines, such as:  Influenza vaccine. This is recommended every year.  Tetanus, diphtheria, and acellular pertussis (Tdap, Td) vaccine. You may need a Td booster every 10 years.  Zoster vaccine. You may need this after age  59.  Pneumococcal 13-valent conjugate (PCV13) vaccine. One dose is recommended after age 59.  Pneumococcal polysaccharide (PPSV23) vaccine. One dose is recommended after age 21. Talk to your health care provider about which screenings and vaccines you need and how often you need them. This information is not intended to replace advice given to you by your health care provider. Make sure you discuss any questions you have with your health care provider. Document Released: 11/01/2015 Document Revised: 06/24/2016 Document Reviewed: 08/06/2015 Elsevier Interactive Patient Education  2017 [place] Prevention in the Home Falls can cause injuries. They can happen to people of all ages. There are many things you can do to make your home safe and to help prevent falls. What can I do on the outside of my home?  Regularly fix the edges of walkways and driveways and fix any cracks.  Remove anything that might make you trip as you walk through a door, such as a raised step or threshold.  Trim any bushes or trees on the path to your home.  Use bright outdoor lighting.  Clear any walking paths of anything that might make someone trip, such as rocks or tools.  Regularly check to see if handrails are loose or broken. Make sure that both sides of any steps have handrails.  Any raised decks and porches should have guardrails on the edges.  Have any leaves, snow, or ice cleared regularly.  Use sand or salt on walking paths during winter.  Clean up any spills in your garage right away. This includes oil or grease spills. What can I do in the bathroom?  Use night lights.  Install grab bars by the toilet and in the tub and shower. Do not use towel bars as grab bars.  Use non-skid mats or decals in the tub or shower.  If you need to sit down in the shower, use a plastic, non-slip stool.  Keep the floor dry. Clean up any water that spills on the floor as soon as it happens.  Remove  soap buildup in the tub or shower regularly.  Attach bath mats securely with double-sided non-slip rug tape.  Do not have throw rugs and other things on the floor that can make you trip. What can I do in the bedroom?  Use night lights.  Make sure that you have a light by your bed that is easy to reach.  Do not use any sheets or blankets that are too big for your bed. They should not hang down onto the floor.  Have a firm chair that has side arms. You can use this for support while you get dressed.  Do not have throw rugs and other things on the floor that can make you trip. What can I do in the kitchen?  Clean up any spills right away.  Avoid walking on wet floors.  Keep items that you use a lot in easy-to-reach places.  If you need to reach something above you, use a strong step stool that has a grab bar.  Keep electrical cords out of the way.  Do not use floor polish or wax that makes floors slippery. If you must use wax, use non-skid floor wax.  Do not have throw rugs and other things  on the floor that can make you trip. What can I do with my stairs?  Do not leave any items on the stairs.  Make sure that there are handrails on both sides of the stairs and use them. Fix handrails that are broken or loose. Make sure that handrails are as long as the stairways.  Check any carpeting to make sure that it is firmly attached to the stairs. Fix any carpet that is loose or worn.  Avoid having throw rugs at the top or bottom of the stairs. If you do have throw rugs, attach them to the floor with carpet tape.  Make sure that you have a light switch at the top of the stairs and the bottom of the stairs. If you do not have them, ask someone to add them for you. What else can I do to help prevent falls?  Wear shoes that:  Do not have high heels.  Have rubber bottoms.  Are comfortable and fit you well.  Are closed at the toe. Do not wear sandals.  If you use a  stepladder:  Make sure that it is fully opened. Do not climb a closed stepladder.  Make sure that both sides of the stepladder are locked into place.  Ask someone to hold it for you, if possible.  Clearly mark and make sure that you can see:  Any grab bars or handrails.  First and last steps.  Where the edge of each step is.  Use tools that help you move around (mobility aids) if they are needed. These include:  Canes.  Walkers.  Scooters.  Crutches.  Turn on the lights when you go into a dark area. Replace any light bulbs as soon as they burn out.  Set up your furniture so you have a clear path. Avoid moving your furniture around.  If any of your floors are uneven, fix them.  If there are any pets around you, be aware of where they are.  Review your medicines with your doctor. Some medicines can make you feel dizzy. This can increase your chance of falling. Ask your doctor what other things that you can do to help prevent falls. This information is not intended to replace advice given to you by your health care provider. Make sure you discuss any questions you have with your health care provider. Document Released: 08/01/2009 Document Revised: 03/12/2016 Document Reviewed: 11/09/2014 Elsevier Interactive Patient Education  2017 [employer].

## 2020-02-13 NOTE — Progress Notes (Signed)
This service is provided via telemedicine  No vital signs collected/recorded due to the encounter was a telemedicine visit.   Location of patient (ex: home, work): Home/  Patient consents to a telephone visit: Yes.  Location of the provider (ex: office, home):  [facility].  Name of any referring provider: N/A  Names of all persons participating in the telemedicine service and their role in the encounter: Patient, [name], RMA, [name], NP.    Time spent on call: 8 minutes spent on the phone with Medical Assistant.     Subjective:   Emily Hayes is a 69 y.o. female who presents for Medicare Annual (Subsequent) preventive examination.  Review of Systems:   Cardiac Risk Factors include: advanced age (>61men, >40 women);dyslipidemia;smoking/ tobacco exposure     Objective:     Vitals: There were no vitals taken for this visit.  There is no height or weight on file to calculate BMI.  Advanced Directives 02/13/2020 02/10/2019 11/16/2018 11/14/2018 01/05/2018  Does Patient Have a Medical Advance Directive? No Yes No No No  Type of Advance Directive - Living will - - -  Would patient like information on creating a medical advance directive? - - No - Patient declined No - Patient declined Yes (MAU/Ambulatory/Procedural Areas - Information given)    Tobacco Social History   Tobacco Use  Smoking Status Current Every Day Smoker  . Packs/day: 0.25  . Years: 50.00  . Pack years: 12.50  . Types: Cigarettes  Smokeless Tobacco Never Used     Ready to quit: Not Answered Counseling given: Not Answered   Clinical Intake:  Pre-visit preparation completed: Yes  Pain : No/denies pain     BMI - recorded: 22 Nutritional Status: BMI of 19-24  Normal Nutritional Risks: None Diabetes: No  How often do you need to have someone help you when you read instructions, pamphlets, or other written materials from your doctor or pharmacy?: 1 - Never         Past Medical History:  Diagnosis Date  . Cervical radiculopathy   . Fatigue   . Fatigue   . Generalized osteoarthrosis   . Generalized osteoarthrosis   . Hyperlipidemia   . Lateral epicondylitis of left elbow 06/28/2017  . Left knee pain   . Menopause   . Neck pain   . Pre-diabetes   . Swelling of left knee joint    Past Surgical History:  Procedure Laterality Date  . ABCESS DRAINAGE Left 09/15/2017   behind tonsil  . VAGINAL HYSTERECTOMY  1983   Family History  Problem Relation Age of Onset  . Stroke Mother   . Diabetes Mother   . Dementia Mother   . Cancer Sister        unknown  . Cancer Sister        unknown  . Kidney failure Brother   . Cancer Brother   . Cancer Brother    Social History   Socioeconomic History  . Marital status: Divorced    Spouse name: Not on file  . Number of children: Not on file  . Years of education: Not on file  . Highest education level: Not on file  Occupational History  . Not on file  Tobacco Use  . Smoking status: Current Every Day Smoker    Packs/day: 0.25    Years: 50.00    Pack years: 12.50    Types: Cigarettes  . Smokeless tobacco: Never Used  Substance and Sexual Activity  .  Alcohol use: No  . Drug use: No  . Sexual activity: Not on file  Other Topics Concern  . Not on file  Social History Narrative   Social History      Diet? none      Do you drink/eat things with caffeine? Chocolate- occasionally      Marital status?        divorced                            What year were you married? 1982      Do you live in a house, apartment, assisted living, condo, trailer, etc.? apartment      Is it one or more stories? 1      How many persons live in your home? 1       Do you have any pets in your home? (please list) yes, dog      Highest level of education completed? Associate degree- college      Current or past profession: retired      Do you exercise?           yes                           Type & how  often? Walk- daily      Advanced Directives      Do you have a living will? yes      Do you have a DNR form?     yes                             If not, do you want to discuss one? yes      Do you have signed POA/HPOA for forms? no      Functional Status      Do you have difficulty bathing or dressing yourself?      Do you have difficulty preparing food or eating?       Do you have difficulty managing your medications?      Do you have difficulty managing your finances?      Do you have difficulty affording your medications?   Social Determinants of Health   Financial Resource Strain:   . Difficulty of Paying Living Expenses:   Food Insecurity:   . Worried About Charity fundraiser in the Last Year:   . Arboriculturist in the Last Year:   Transportation Needs:   . Film/video editor (Medical):   Marland Kitchen Lack of Transportation (Non-Medical):   Physical Activity:   . Days of Exercise per Week:   . Minutes of Exercise per Session:   Stress:   . Feeling of Stress :   Social Connections:   . Frequency of Communication with Friends and Family:   . Frequency of Social Gatherings with Friends and Family:   . Attends Religious Services:   . Active Member of Clubs or Organizations:   . Attends Archivist Meetings:   Marland Kitchen Marital Status:     Outpatient Encounter Medications as of 02/13/2020  Medication Sig  . Calcium Citrate-Vitamin D (CALCIUM CITRATE + D PO) Take 1 tablet by mouth daily.  . diphenhydrAMINE (BENADRYL) 25 MG tablet Take 25 mg by mouth as needed.   . fluticasone (FLONASE) 50 MCG/ACT nasal spray SHAKE LIQUID AND USE 1 SPRAY IN EACH NOSTRIL  TWICE DAILY  . ibuprofen (ADVIL,MOTRIN) 200 MG tablet Take 200 mg by mouth daily as needed.  . Misc Natural Products (COLON CLEANSE) CAPS Take by mouth. Once every 2 weeks  . Omega-3 Fatty Acids (FISH OIL PO) Take 1 capsule by mouth daily.  . vitamin E 400 UNIT capsule Take 400 Units by mouth 2 (two) times daily.   No  facility-administered encounter medications on file as of 02/13/2020.    Activities of Daily Living In your present state of health, do you have any difficulty performing the following activities: 02/13/2020  Hearing? N  Vision? N  Difficulty concentrating or making decisions? N  Walking or climbing stairs? N  Dressing or bathing? N  Doing errands, shopping? N  Preparing Food and eating ? N  Using the Toilet? N  In the past six months, have you accidently leaked urine? N  Do you have problems with loss of bowel control? N  Managing your Medications? N  Managing your Finances? N  Housekeeping or managing your Housekeeping? N  Some recent data might be hidden    Patient Care Team: [name], NP as PCP - General (Geriatric Medicine)    Assessment:   This is a routine wellness examination for [place].  Exercise Activities and Dietary recommendations Current Exercise Habits: Home exercise routine, Time (Minutes): 45, Frequency (Times/Week): 7, Weekly Exercise (Minutes/Week): 315  Goals    . Quit Smoking     Would like to decrease smoking (currently smoking 1 pack in 2 days) but not quit       Fall Risk Fall Risk  02/13/2020 02/10/2019 11/16/2018 11/14/2018 11/07/2018  Falls in the past year? 0 0 0 0 0  Number falls in past yr: 0 0 0 0 0  Injury with Fall? 0 0 0 0 0   Is the patient's home free of loose throw rugs in walkways, pet beds, electrical cords, etc?   yes      Grab bars in the bathroom? no      Handrails on the stairs?   no stairs      Adequate lighting?   yes  Timed Get Up and Go performed:   Depression Screen PHQ 2/9 Scores 02/13/2020 02/10/2019 01/12/2018 01/05/2018  PHQ - 2 Score 0 0 0 0     Cognitive Function MMSE - Mini Mental State Exam 01/05/2018  Orientation to time 5  Orientation to Place 5  Registration 3  Attention/ Calculation 5  Recall 3  Language- name 2 objects 2  Language- repeat 1  Language- follow 3 step command 3  Language- read &  follow direction 1  Write a sentence 1  Copy design 1  Total score 30     6CIT Screen 02/13/2020 02/10/2019  What Year? 0 points 0 points  What month? 0 points 0 points  What time? 0 points 0 points  Count back from 20 0 points 0 points  Months in reverse 0 points 0 points  Repeat phrase 0 points 2 points  Total Score 0 2    Immunization History  Administered Date(s) Administered  . Zoster Recombinat (Shingrix) 03/19/2017, 07/19/2017    Qualifies for Shingles Vaccine? done  Screening Tests Health Maintenance  Topic Date Due  . Hepatitis C Screening  Never done  . FOOT EXAM  Never done  . OPHTHALMOLOGY EXAM  Never done  . COVID-19 Vaccine (1) Never done  . TETANUS/TDAP  Never done  . URINE MICROALBUMIN  10/14/2017  . HEMOGLOBIN A1C  07/08/2018  . PNA vac Low Risk Adult (1 of 2 - PCV13) 02/10/2079 (Originally 08/11/2014)  . MAMMOGRAM  02/16/2020  . INFLUENZA VACCINE  05/19/2020  . Fecal DNA (Cologuard)  02/21/2022  . DEXA SCAN  Completed    Cancer Screenings: Lung: Low Dose CT Chest recommended if Age 47-80 years, 30 pack-year currently smoking OR have quit w/in 15years. Patient does qualify. However she declines Breast:  Up to date on Mammogram? No   Up to date of Bone Density/Dexa? No Colorectal: colonoscopy recommended.   Additional Screenings:  Hepatitis C Screening: declines.      Plan:      I have personally reviewed and noted the following in the patient's chart:   . Medical and social history . Use of alcohol, tobacco or illicit drugs  . Current medications and supplements . Functional ability and status . Nutritional status . Physical activity . Advanced directives . List of other physicians . Hospitalizations, surgeries, and ER visits in previous 12 months . Vitals . Screenings to include cognitive, depression, and falls . Referrals and appointments  In addition, I have reviewed and discussed with patient certain preventive protocols, quality  metrics, and best practice recommendations. A written personalized care plan for preventive services as well as general preventive health recommendations were provided to patient.     [name], NP  02/13/2020

## 2020-06-28 DIAGNOSIS — H43393 Other vitreous opacities, bilateral: Secondary | ICD-10-CM | POA: Diagnosis not present

## 2020-06-29 DIAGNOSIS — Z03818 Encounter for observation for suspected exposure to other biological agents ruled out: Secondary | ICD-10-CM | POA: Diagnosis not present

## 2020-07-02 ENCOUNTER — Other Ambulatory Visit: Payer: Medicare HMO

## 2020-09-04 DIAGNOSIS — Z03818 Encounter for observation for suspected exposure to other biological agents ruled out: Secondary | ICD-10-CM | POA: Diagnosis not present

## 2020-09-06 ENCOUNTER — Encounter: Payer: Self-pay | Admitting: Nurse Practitioner

## 2020-09-06 ENCOUNTER — Ambulatory Visit (INDEPENDENT_AMBULATORY_CARE_PROVIDER_SITE_OTHER): Payer: Medicare HMO | Admitting: Nurse Practitioner

## 2020-09-06 ENCOUNTER — Other Ambulatory Visit: Payer: Self-pay

## 2020-09-06 VITALS — BP 150/90 | HR 72 | Temp 97.5°F | Ht 67.0 in | Wt 134.0 lb

## 2020-09-06 DIAGNOSIS — E1169 Type 2 diabetes mellitus with other specified complication: Secondary | ICD-10-CM

## 2020-09-06 DIAGNOSIS — R195 Other fecal abnormalities: Secondary | ICD-10-CM

## 2020-09-06 DIAGNOSIS — E782 Mixed hyperlipidemia: Secondary | ICD-10-CM

## 2020-09-06 DIAGNOSIS — I10 Essential (primary) hypertension: Secondary | ICD-10-CM

## 2020-09-06 DIAGNOSIS — R928 Other abnormal and inconclusive findings on diagnostic imaging of breast: Secondary | ICD-10-CM

## 2020-09-06 DIAGNOSIS — Z1159 Encounter for screening for other viral diseases: Secondary | ICD-10-CM | POA: Diagnosis not present

## 2020-09-06 DIAGNOSIS — Z823 Family history of stroke: Secondary | ICD-10-CM | POA: Diagnosis not present

## 2020-09-06 DIAGNOSIS — E119 Type 2 diabetes mellitus without complications: Secondary | ICD-10-CM

## 2020-09-06 DIAGNOSIS — E785 Hyperlipidemia, unspecified: Secondary | ICD-10-CM | POA: Diagnosis not present

## 2020-09-06 LAB — CBC WITH DIFFERENTIAL/PLATELET
Basophils Absolute: 51 cells/uL (ref 0–200)
Hemoglobin: 12.4 g/dL (ref 11.7–15.5)
MCHC: 31.5 g/dL — ABNORMAL LOW (ref 32.0–36.0)
Neutrophils Relative %: 37.4 %

## 2020-09-06 MED ORDER — LOSARTAN POTASSIUM 25 MG PO TABS: 25.0000 mg | ORAL_TABLET | Freq: Every day | ORAL | 1 refills | Status: DC

## 2020-09-06 NOTE — Patient Instructions (Signed)
To start losartan 25 mg by mouth daily for blood pressure.   DASH Eating Plan DASH stands for "Dietary Approaches to Stop Hypertension." The DASH eating plan is a healthy eating plan that has been shown to reduce high blood pressure (hypertension). It may also reduce your risk for type 2 diabetes, heart disease, and stroke. The DASH eating plan may also help with weight loss. What are tips for following this plan?  General guidelines  Avoid eating more than 2,300 mg (milligrams) of salt (sodium) a day. If you have hypertension, you may need to reduce your sodium intake to 1,500 mg a day.  Limit alcohol intake to no more than 1 drink a day for nonpregnant women and 2 drinks a day for men. One drink equals 12 oz of beer, 5 oz of wine, or 1 oz of hard liquor.  Work with your health care provider to maintain a healthy body weight or to lose weight. Ask what an ideal weight is for you.  Get at least 30 minutes of exercise that causes your heart to beat faster (aerobic exercise) most days of the week. Activities may include walking, swimming, or biking.  Work with your health care provider or diet and nutrition specialist (dietitian) to adjust your eating plan to your individual calorie needs. Reading food labels   Check food labels for the amount of sodium per serving. Choose foods with less than 5 percent of the Daily Value of sodium. Generally, foods with less than 300 mg of sodium per serving fit into this eating plan.  To find whole grains, look for the word "whole" as the first word in the ingredient list. Shopping  Buy products labeled as "low-sodium" or "no salt added."  Buy fresh foods. Avoid canned foods and premade or frozen meals. Cooking  Avoid adding salt when cooking. Use salt-free seasonings or herbs instead of table salt or sea salt. Check with your health care provider or pharmacist before using salt substitutes.  Do not fry foods. Cook foods using healthy methods such  as baking, boiling, grilling, and broiling instead.  Cook with heart-healthy oils, such as olive, canola, soybean, or sunflower oil. Meal planning  Eat a balanced diet that includes: ? 5 or more servings of fruits and vegetables each day. At each meal, try to fill half of your plate with fruits and vegetables. ? Up to 6-8 servings of whole grains each day. ? Less than 6 oz of lean meat, poultry, or fish each day. A 3-oz serving of meat is about the same size as a deck of cards. One egg equals 1 oz. ? 2 servings of low-fat dairy each day. ? A serving of nuts, seeds, or beans 5 times each week. ? Heart-healthy fats. Healthy fats called Omega-3 fatty acids are found in foods such as flaxseeds and coldwater fish, like sardines, salmon, and mackerel.  Limit how much you eat of the following: ? Canned or prepackaged foods. ? Food that is high in trans fat, such as fried foods. ? Food that is high in saturated fat, such as fatty meat. ? Sweets, desserts, sugary drinks, and other foods with added sugar. ? Full-fat dairy products.  Do not salt foods before eating.  Try to eat at least 2 vegetarian meals each week.  Eat more home-cooked food and less restaurant, buffet, and fast food.  When eating at a restaurant, ask that your food be prepared with less salt or no salt, if possible. What foods are recommended? The  items listed may not be a complete list. Talk with your dietitian about what dietary choices are best for you. Grains Whole-grain or whole-wheat bread. Whole-grain or whole-wheat pasta. Brown rice. [place]. Bulgur. Whole-grain and low-sodium cereals. Pita bread. Low-fat, low-sodium crackers. Whole-wheat flour tortillas. Vegetables Fresh or frozen vegetables (raw, steamed, roasted, or grilled). Low-sodium or reduced-sodium tomato and vegetable juice. Low-sodium or reduced-sodium tomato sauce and tomato paste. Low-sodium or reduced-sodium canned vegetables. Fruits All fresh,  dried, or frozen fruit. Canned fruit in natural juice (without added sugar). Meat and other protein foods Skinless chicken or [place]. Ground chicken or [place]. Pork with fat trimmed off. Fish and seafood. Egg whites. Dried beans, peas, or lentils. Unsalted nuts, nut butters, and seeds. Unsalted canned beans. Lean cuts of beef with fat trimmed off. Low-sodium, lean deli meat. Dairy Low-fat (1%) or fat-free (skim) milk. Fat-free, low-fat, or reduced-fat cheeses. Nonfat, low-sodium ricotta or cottage cheese. Low-fat or nonfat yogurt. Low-fat, low-sodium cheese. Fats and oils Soft margarine without trans fats. Vegetable oil. Low-fat, reduced-fat, or light mayonnaise and salad dressings (reduced-sodium). Canola, safflower, olive, soybean, and sunflower oils. Avocado. Seasoning and other foods Herbs. Spices. Seasoning mixes without salt. Unsalted popcorn and pretzels. Fat-free sweets. What foods are not recommended? The items listed may not be a complete list. Talk with your dietitian about what dietary choices are best for you. Grains Baked goods made with fat, such as croissants, muffins, or some breads. Dry pasta or rice meal packs. Vegetables Creamed or fried vegetables. Vegetables in a cheese sauce. Regular canned vegetables (not low-sodium or reduced-sodium). Regular canned tomato sauce and paste (not low-sodium or reduced-sodium). Regular tomato and vegetable juice (not low-sodium or reduced-sodium). Angie Fava. Olives. Fruits Canned fruit in a light or heavy syrup. Fried fruit. Fruit in cream or butter sauce. Meat and other protein foods Fatty cuts of meat. Ribs. Fried meat. Berniece Salines. Sausage. Bologna and other processed lunch meats. Salami. Fatback. Hotdogs. Bratwurst. Salted nuts and seeds. Canned beans with added salt. Canned or smoked fish. Whole eggs or egg yolks. Chicken or [place] with skin. Dairy Whole or 2% milk, cream, and half-and-half. Whole or full-fat cream cheese. Whole-fat or sweetened  yogurt. Full-fat cheese. Nondairy creamers. Whipped toppings. Processed cheese and cheese spreads. Fats and oils Butter. Stick margarine. Lard. Shortening. Ghee. Bacon fat. Tropical oils, such as coconut, palm kernel, or palm oil. Seasoning and other foods Salted popcorn and pretzels. Onion salt, garlic salt, seasoned salt, table salt, and sea salt. Worcestershire sauce. Tartar sauce. Barbecue sauce. Teriyaki sauce. Soy sauce, including reduced-sodium. Steak sauce. Canned and packaged gravies. Fish sauce. Oyster sauce. Cocktail sauce. Horseradish that you find on the shelf. Ketchup. Mustard. Meat flavorings and tenderizers. Bouillon cubes. Hot sauce and Tabasco sauce. Premade or packaged marinades. Premade or packaged taco seasonings. Relishes. Regular salad dressings. Where to find more information:  National Heart, Lung, and [place]: https://wilson-eaton.com/  American Heart Association: www.heart.org Summary  The DASH eating plan is a healthy eating plan that has been shown to reduce high blood pressure (hypertension). It may also reduce your risk for type 2 diabetes, heart disease, and stroke.  With the DASH eating plan, you should limit salt (sodium) intake to 2,300 mg a day. If you have hypertension, you may need to reduce your sodium intake to 1,500 mg a day.  When on the DASH eating plan, aim to eat more fresh fruits and vegetables, whole grains, lean proteins, low-fat dairy, and heart-healthy fats.  Work with your health care provider  or diet and nutrition specialist (dietitian) to adjust your eating plan to your individual calorie needs. This information is not intended to replace advice given to you by your health care provider. Make sure you discuss any questions you have with your health care provider. Document Revised: 09/17/2017 Document Reviewed: 09/28/2016 Elsevier Patient Education  [place].   Losartan Tablets What is this medicine? LOSARTAN (loe SAR tan) is an  angiotensin II receptor blocker, also known as an ARB. It treats high blood pressure. It can slow kidney damage in some patients. It may also be used to lower the risk of stroke. This medicine may be used for other purposes; ask your health care provider or pharmacist if you have questions. COMMON BRAND NAME(S): Cozaar What should I tell my health care provider before I take this medicine? They need to know if you have any of these conditions:  heart failure  kidney or liver disease  an unusual or allergic reaction to losartan, other medicines, foods, dyes, or preservatives  pregnant or trying to get pregnant  breast-feeding How should I use this medicine? Take this drug by mouth. Take it as directed on the prescription label at the same time every day. You can take it with or without food. If it upsets your stomach, take it with food. Keep taking it unless your health care provider tells you to stop. Talk to your health care provider about the use of this drug in children. While it may be prescribed for children as young as 6 for selected conditions, precautions do apply. Overdosage: If you think you have taken too much of this medicine contact a poison control center or emergency room at once. NOTE: This medicine is only for you. Do not share this medicine with others. What if I miss a dose? If you miss a dose, take it as soon as you can. If it is almost time for your next dose, take only that dose. Do not take double or extra doses. What may interact with this medicine?  blood pressure medicines  diuretics, especially triamterene, spironolactone, or amiloride  fluconazole  NSAIDs, medicines for pain and inflammation, like ibuprofen or naproxen  potassium salts or potassium supplements  rifampin This list may not describe all possible interactions. Give your health care provider a list of all the medicines, herbs, non-prescription drugs, or dietary supplements you use. Also tell  them if you smoke, drink alcohol, or use illegal drugs. Some items may interact with your medicine. What should I watch for while using this medicine? Visit your doctor or health care professional for regular checks on your progress. Check your blood pressure as directed. Ask your doctor or health care professional what your blood pressure should be and when you should contact him or her. Call your doctor or health care professional if you notice an irregular or fast heart beat. Women should inform their doctor if they wish to become pregnant or think they might be pregnant. There is a potential for serious side effects to an unborn child, particularly in the second or third trimester. Talk to your health care professional or pharmacist for more information. You may get drowsy or dizzy. Do not drive, use machinery, or do anything that needs mental alertness until you know how this drug affects you. Do not stand or sit up quickly, especially if you are an older patient. This reduces the risk of dizzy or fainting spells. Alcohol can make you more drowsy and dizzy. Avoid alcoholic drinks.  Avoid salt substitutes unless you are told otherwise by your doctor or health care professional. Do not treat yourself for coughs, colds, or pain while you are taking this medicine without asking your doctor or health care professional for advice. Some ingredients may increase your blood pressure. What side effects may I notice from receiving this medicine? Side effects that you should report to your doctor or health care professional as soon as possible:  confusion, dizziness, light headedness or fainting spells  decreased amount of urine passed  difficulty breathing or swallowing, hoarseness, or tightening of the throat  fast or irregular heart beat, palpitations, or chest pain  skin rash, itching  swelling of your face, lips, tongue, hands, or feet Side effects that usually do not require medical attention  (report to your doctor or health care professional if they continue or are bothersome):  cough  decreased sexual function or desire  headache  nasal congestion or stuffiness  nausea or stomach pain  sore or cramping muscles This list may not describe all possible side effects. Call your doctor for medical advice about side effects. You may report side effects to FDA at 1-800-FDA-1088. Where should I keep my medicine? Keep out of the reach of children and pets. Store at room temperature between 15 and 30 degrees C (59 and 86 degrees F). Protect from light. Keep the container tightly closed. Throw away any unused drug after the expiration date. NOTE: This sheet is a summary. It may not cover all possible information. If you have questions about this medicine, talk to your doctor, pharmacist, or health care provider.  2020 Elsevier/Gold Standard (2019-05-10 12:12:28)

## 2020-09-06 NOTE — Progress Notes (Signed)
Careteam: Patient Care Team: [name], NP as PCP - General (Geriatric Medicine)  PLACE OF SERVICE:  [place] Directive information    No Known Allergies  Chief Complaint  Patient presents with  . Medical Management of Chronic Issues    Follow up visit. Patient states she has scratchy throat on right side. Received COVID booster about 2 weeks ago. No fever, no nausea, no vomiting. No loss of taste or smell. Feels like an abscess she had a few years ago. Patient has pending COVID test. she got tested on Tuesday.  [facility] Best Practice Recommendations    Hep c screening, Flu vaccine, Tetanus vaccine  . Quality Metric Gaps    Eye exam, Foot exam urine microalbumin     HPI: Patient is a 69 y.o. female for follow up. She has not had recent follow up or lab work.   Has had a scratchy throat for several weeks- post nasal drip, some allergy symptoms  Gets routine covid testing "because its free" does not have fever, chills, loss of taste or smell.   Has pending covid test bc she is traveling next week.  Pt had a positive cologuard in May 2020- did not follow up with GI. Does not want to be put to sleep- aware that this could mean she has cancer and she states "I do not believe I have cancer"  Declines flu shot.   DM- Dr Ronnald Ramp at [facility] did her eye exam in September. Does not check blood sugars   Had an abnormal mammogram reports she has always had an "abnormal" and had to have biopsy in the past and was normal -does not want to have biopsy. Can not afford it. And generally has been without abnormal finding.   Osteopenia- taking cal and vit d supplements.  Walks and cleans house. Very active.   Continues to smoke   Review of Systems:  Review of Systems  Constitutional: Negative for chills, fever and weight loss.  HENT: Negative for tinnitus.   Respiratory: Negative for cough, sputum production and shortness of breath.   Cardiovascular: Negative for  chest pain, palpitations and leg swelling.  Gastrointestinal: Negative for abdominal pain, constipation, diarrhea and heartburn.  Genitourinary: Negative for dysuria, frequency and urgency.  Musculoskeletal: Negative for back pain, falls, joint pain and myalgias.  Skin: Negative.   Neurological: Negative for dizziness, tingling and headaches.  Psychiatric/Behavioral: Negative for depression and memory loss. The patient does not have insomnia.     Past Medical History:  Diagnosis Date  . Cervical radiculopathy   . Fatigue   . Fatigue   . Generalized osteoarthrosis   . Generalized osteoarthrosis   . Hyperlipidemia   . Lateral epicondylitis of left elbow 06/28/2017  . Left knee pain   . Menopause   . Neck pain   . Pre-diabetes   . Sciatica   . Swelling of left knee joint    Past Surgical History:  Procedure Laterality Date  . ABCESS DRAINAGE Left 09/15/2017   behind tonsil  . VAGINAL HYSTERECTOMY  1983   Social History:   reports that she has been smoking cigarettes. She has a 12.50 pack-year smoking history. She has never used smokeless tobacco. She reports that she does not drink alcohol and does not use drugs.  Family History  Problem Relation Age of Onset  . Stroke Mother   . Diabetes Mother   . Dementia Mother   . Cancer Sister  unknown  . Cancer Sister        unknown  . Kidney failure Brother   . Cancer Brother   . Cancer Brother     Medications: Patient's Medications  New Prescriptions   No medications on file  Previous Medications   ACETAMINOPHEN (TYLENOL) 500 MG TABLET    Take 500 mg by mouth daily as needed.   CALCIUM CITRATE-VITAMIN D (CALCIUM CITRATE + D PO)    Take 1 tablet by mouth daily.   DIPHENHYDRAMINE (BENADRYL) 25 MG TABLET    Take 25 mg by mouth as needed.    FLUTICASONE (FLONASE) 50 MCG/ACT NASAL SPRAY    SHAKE LIQUID AND USE 1 SPRAY IN EACH NOSTRIL TWICE DAILY   GLUCOSAMINE-CHONDROITIN 500-400 MG TABLET    Take 1 tablet by mouth  daily.   MISC NATURAL PRODUCTS (COLON CLEANSE) CAPS    Take by mouth. As needed   VITAMIN E 400 UNIT CAPSULE    Take 1,200 Units by mouth daily.   Modified Medications   No medications on file  Discontinued Medications   IBUPROFEN (ADVIL,MOTRIN) 200 MG TABLET    Take 200 mg by mouth daily as needed.   OMEGA-3 FATTY ACIDS (FISH OIL PO)    Take 1 capsule by mouth daily.    Physical Exam:  Vitals:   09/06/20 1145  BP: (!) 150/90  Pulse: 72  Temp: (!) 97.5 F (36.4 C)  TempSrc: Temporal  SpO2: 98%  Weight: 134 lb (60.8 kg)  Height: 5\' 7"  (1.702 m)   Body mass index is 20.99 kg/m. Wt Readings from Last 3 Encounters:  09/06/20 134 lb (60.8 kg)  11/16/18 140 lb 9.6 oz (63.8 kg)  11/14/18 139 lb 6.4 oz (63.2 kg)    Physical Exam Constitutional:      General: She is not in acute distress.    Appearance: She is well-developed. She is not diaphoretic.  HENT:     Head: Normocephalic and atraumatic.     Mouth/Throat:     Pharynx: No oropharyngeal exudate.  Eyes:     Conjunctiva/sclera: Conjunctivae normal.     Pupils: Pupils are equal, round, and reactive to light.  Cardiovascular:     Rate and Rhythm: Normal rate and regular rhythm.     Pulses:          Dorsalis pedis pulses are 2+ on the right side and 2+ on the left side.       Posterior tibial pulses are 2+ on the right side and 2+ on the left side.     Heart sounds: Normal heart sounds.  Pulmonary:     Effort: Pulmonary effort is normal.     Breath sounds: Normal breath sounds.  Abdominal:     General: Bowel sounds are normal.     Palpations: Abdomen is soft.  Musculoskeletal:        General: No tenderness.     Cervical back: Normal range of motion and neck supple.  Feet:     Right foot:     Protective Sensation: 3 sites tested. 2 sites sensed.     Left foot:     Protective Sensation: 3 sites tested. 3 sites sensed.  Skin:    General: Skin is warm and dry.  Neurological:     Mental Status: She is alert and  oriented to person, place, and time.     Labs reviewed: Basic Metabolic Panel: No results for input(s): NA, K, CL, CO2, GLUCOSE, BUN, CREATININE, CALCIUM, MG,  PHOS, TSH in the last 8760 hours. Liver Function Tests: No results for input(s): AST, ALT, ALKPHOS, BILITOT, PROT, ALBUMIN in the last 8760 hours. No results for input(s): LIPASE, AMYLASE in the last 8760 hours. No results for input(s): AMMONIA in the last 8760 hours. CBC: No results for input(s): WBC, NEUTROABS, HGB, HCT, MCV, PLT in the last 8760 hours. Lipid Panel: No results for input(s): CHOL, HDL, LDLCALC, TRIG, CHOLHDL, LDLDIRECT in the last 8760 hours. TSH: No results for input(s): TSH in the last 8760 hours. A1C: Lab Results  Component Value Date   HGBA1C 6.7 (H) 01/05/2018     Assessment/Plan 1. Positive colorectal cancer screening using Cologuard test -after discussion on what positive cologuard means she is agreeable for further evaluation by GI. Discussed early detection of colon cancer is more easily treatable vs late detection that would involve more aggressive and invasive treatment and care.  - Ambulatory referral to Gastroenterology  2. Need for hepatitis C screening test - Hepatitis C antibody  3. Mixed hyperlipidemia Significantly high LDL in the past. She has said she was agreeable to treatment but then did not follow up once Rx was sent to pharmacy. Dietary modifications encouraged.  -discussed the importance of proper diet with control of hyperlipidemia and diabetes and hypertension to reduce risk of heart attack and stroke.  - COMPLETE METABOLIC PANEL WITH GFR - Lipid Panel  4. Primary hypertension -not at goal. Encouraged low sodium diet. Will start losartan to help with blood pressure control and for renal protection with diabetes. - CBC with Differential/Platelet - COMPLETE METABOLIC PANEL WITH GFR - losartan (COZAAR) 25 MG tablet; Take 1 tablet (25 mg total) by mouth daily.  Dispense: 30  tablet; Refill: 1  5. Abnormal mammogram of right breast Pt aware of risk if left untreated, however she has had several biopsies in the past that have been normal.   6. Diabetes mellitus  ([place]) -Encouraged dietary compliance, routine foot care/monitoring and to keep up with diabetic eye exams through ophthalmology  - Hemoglobin A1c - Microalbumin, urine - losartan (COZAAR) 25 MG tablet; Take 1 tablet (25 mg total) by mouth daily.  Dispense: 30 tablet; Refill: 1  7. Family history of stroke -with strong family hx of stroke discussed pt the high likelihood of her having stoke if cholesterol, blood pressure and diabetes were uncontrolled. Stress the importance to manage these conditions to reduce the risk. She verbalized understanding.   Next appt: 2 weeks on blood pressure Will discuss adding ASA at follow up appt.  Carlos American. [place], [facility] [phone]

## 2020-09-07 LAB — COMPLETE METABOLIC PANEL WITH GFR
AG Ratio: 1.8 (calc) (ref 1.0–2.5)
Chloride: 104 mmol/L (ref 98–110)
Creat: 0.76 mg/dL (ref 0.60–0.93)
Total Bilirubin: 0.4 mg/dL (ref 0.2–1.2)

## 2020-09-09 LAB — COMPLETE METABOLIC PANEL WITH GFR
ALT: 8 U/L (ref 6–29)
AST: 14 U/L (ref 10–35)
Albumin: 4.5 g/dL (ref 3.6–5.1)
Alkaline phosphatase (APISO): 63 U/L (ref 37–153)
BUN: 13 mg/dL (ref 7–25)
CO2: 26 mmol/L (ref 20–32)
Calcium: 9.9 mg/dL (ref 8.6–10.4)
GFR, Est African American: 91 mL/min/{1.73_m2} (ref >=60)
GFR, Est Non African American: 79 mL/min/{1.73_m2} (ref >=60)
Globulin: 2.5 g/dL (calc) (ref 1.9–3.7)
Glucose, Bld: 86 mg/dL (ref 65–139)
Potassium: 4.6 mmol/L (ref 3.5–5.3)
Sodium: 141 mmol/L (ref 135–146)
Total Protein: 7 g/dL (ref 6.1–8.1)

## 2020-09-09 LAB — CBC WITH DIFFERENTIAL/PLATELET
Absolute Monocytes: 496 cells/uL (ref 200–950)
Basophils Relative: 0.9 %
Eosinophils Absolute: 80 cells/uL (ref 15–500)
Eosinophils Relative: 1.4 %
HCT: 39.4 % (ref 35.0–45.0)
Lymphs Abs: 2941 cells/uL (ref 850–3900)
MCH: 26.9 pg — ABNORMAL LOW (ref 27.0–33.0)
MCV: 85.5 fL (ref 80.0–100.0)
MPV: 11.7 fL (ref 7.5–12.5)
Monocytes Relative: 8.7 %
Neutro Abs: 2132 cells/uL (ref 1500–7800)
Platelets: 277 10*3/uL (ref 140–400)
RBC: 4.61 10*6/uL (ref 3.80–5.10)
RDW: 14.3 % (ref 11.0–15.0)
Total Lymphocyte: 51.6 %
WBC: 5.7 10*3/uL (ref 3.8–10.8)

## 2020-09-09 LAB — LIPID PANEL
Cholesterol: 324 mg/dL — ABNORMAL HIGH (ref <200)
HDL: 50 mg/dL (ref >=50)
LDL Cholesterol (Calc): 222 mg/dL (calc) — ABNORMAL HIGH
Non-HDL Cholesterol (Calc): 274 mg/dL (calc) — ABNORMAL HIGH (ref <130)
Total CHOL/HDL Ratio: 6.5 (calc) — ABNORMAL HIGH (ref <5.0)
Triglycerides: 309 mg/dL — ABNORMAL HIGH (ref <150)

## 2020-09-09 LAB — HEMOGLOBIN A1C
Hgb A1c MFr Bld: 6.7 % of total Hgb — ABNORMAL HIGH (ref <5.7)
Mean Plasma Glucose: 146 (calc)
eAG (mmol/L): 8.1 (calc)

## 2020-09-09 LAB — HEPATITIS C ANTIBODY
Hepatitis C Ab: NONREACTIVE
SIGNAL TO CUT-OFF: 0.01 (ref <1.00)

## 2020-09-10 ENCOUNTER — Other Ambulatory Visit: Payer: Self-pay

## 2020-09-10 MED ORDER — ROSUVASTATIN CALCIUM 5 MG PO TABS: 5.0000 mg | ORAL_TABLET | Freq: Every day | ORAL | 1 refills | Status: DC

## 2020-09-20 ENCOUNTER — Other Ambulatory Visit: Payer: Self-pay | Admitting: Nurse Practitioner

## 2020-09-20 ENCOUNTER — Other Ambulatory Visit: Payer: Self-pay

## 2020-09-20 ENCOUNTER — Ambulatory Visit (INDEPENDENT_AMBULATORY_CARE_PROVIDER_SITE_OTHER): Payer: Medicare HMO | Admitting: Nurse Practitioner

## 2020-09-20 ENCOUNTER — Encounter: Payer: Self-pay | Admitting: Nurse Practitioner

## 2020-09-20 VITALS — BP 136/78 | HR 78 | Temp 97.4°F | Ht 67.0 in | Wt 136.0 lb

## 2020-09-20 DIAGNOSIS — I10 Essential (primary) hypertension: Secondary | ICD-10-CM | POA: Diagnosis not present

## 2020-09-20 DIAGNOSIS — E782 Mixed hyperlipidemia: Secondary | ICD-10-CM | POA: Diagnosis not present

## 2020-09-20 MED ORDER — ATORVASTATIN CALCIUM 10 MG PO TABS: 10.0000 mg | ORAL_TABLET | Freq: Every day | ORAL | 1 refills | Status: DC

## 2020-09-20 MED ORDER — ASPIRIN EC 81 MG PO TBEC: 81.0000 mg | DELAYED_RELEASE_TABLET | Freq: Every day | ORAL | 11 refills | Status: DC

## 2020-09-20 NOTE — Patient Instructions (Addendum)
To stop crestor   Start lipitor daily at bedtime. To notify us if you are not tolerating this medication If you are able to tolerate medication follow up lab in 6 week fasting labs.    Continue diet modifications.   Start ASA EC 81 mg daily

## 2020-09-20 NOTE — Progress Notes (Signed)
Careteam: Patient Care Team: [name], NP as PCP - General (Geriatric Medicine)  PLACE OF SERVICE:  [place] Directive information    No Known Allergies  Chief Complaint  Patient presents with  . Follow-up    Follow up on high BP. Patient has not checked BP at home. She also has concerns about Crestor. She stopped taking Crestor after 3 days because she felt like they were swelling at night. The feeling hasn't happened since she stop taking the medication.     HPI: Patient is a 69 y.o. female for follow up on blood pressure and cholesterol medication.   Has OTC allowance and she will get a blood pressure cuff to monitor.   Reports swelling in hands over night and pressure. She states she took crestor for 3 nights and happened every night but the 4th night she did not take and it did not happen.   No headaches, chest pains, shortness of breath  Looking a labels, cooking without salt  Has looked online and eating more foods to help with cholesterol.  Changing diet- eating a lot of beats.   Review of Systems:  Review of Systems  Constitutional: Negative for chills, fever and weight loss.  HENT: Negative for tinnitus.   Respiratory: Negative for cough, sputum production and shortness of breath.   Cardiovascular: Negative for chest pain, palpitations and leg swelling.  Gastrointestinal: Negative for abdominal pain, constipation, diarrhea and heartburn.  Genitourinary: Negative for dysuria, frequency and urgency.  Musculoskeletal: Negative for back pain, falls, joint pain and myalgias.  Skin: Negative.   Neurological: Negative for dizziness and headaches.  Psychiatric/Behavioral: Negative for depression and memory loss. The patient does not have insomnia.     Past Medical History:  Diagnosis Date  . Cervical radiculopathy   . Fatigue   . Fatigue   . Generalized osteoarthrosis   . Generalized osteoarthrosis   . Hyperlipidemia   . Lateral  epicondylitis of left elbow 06/28/2017  . Left knee pain   . Menopause   . Neck pain   . Pre-diabetes   . Sciatica   . Swelling of left knee joint    Past Surgical History:  Procedure Laterality Date  . ABCESS DRAINAGE Left 09/15/2017   behind tonsil  . VAGINAL HYSTERECTOMY  1983   Social History:   reports that she has been smoking cigarettes. She has a 12.50 pack-year smoking history. She has never used smokeless tobacco. She reports that she does not drink alcohol and does not use drugs.  Family History  Problem Relation Age of Onset  . Stroke Mother   . Diabetes Mother   . Dementia Mother   . Cancer Sister        unknown  . Cancer Sister        unknown  . Kidney failure Brother   . Cancer Brother   . Cancer Brother     Medications: Patient's Medications  New Prescriptions   No medications on file  Previous Medications   ACETAMINOPHEN (TYLENOL) 500 MG TABLET    Take 500 mg by mouth daily as needed.   CALCIUM CITRATE-VITAMIN D (CALCIUM CITRATE + D PO)    Take 1 tablet by mouth daily.   DIPHENHYDRAMINE (BENADRYL) 25 MG TABLET    Take 25 mg by mouth as needed.    FLUTICASONE (FLONASE) 50 MCG/ACT NASAL SPRAY    SHAKE LIQUID AND USE 1 SPRAY IN EACH NOSTRIL TWICE DAILY   GLUCOSAMINE-CHONDROITIN 500-400  MG TABLET    Take 1 tablet by mouth daily.   LOSARTAN (COZAAR) 25 MG TABLET    Take 1 tablet (25 mg total) by mouth daily.   MISC NATURAL PRODUCTS (COLON CLEANSE) CAPS    Take by mouth. As needed   ROSUVASTATIN (CRESTOR) 5 MG TABLET    Take 1 tablet (5 mg total) by mouth at bedtime.   VITAMIN E 400 UNIT CAPSULE    Take 1,200 Units by mouth daily.   Modified Medications   No medications on file  Discontinued Medications   No medications on file    Physical Exam:  Vitals:   09/20/20 1126  BP: (!) 150/80  Pulse: 78  Temp: (!) 97.4 F (36.3 C)  TempSrc: Temporal  SpO2: 98%  Weight: 136 lb (61.7 kg)  Height: 5\' 7"  (1.702 m)   Body mass index is 21.3 kg/m. Wt  Readings from Last 3 Encounters:  09/20/20 136 lb (61.7 kg)  09/06/20 134 lb (60.8 kg)  11/16/18 140 lb 9.6 oz (63.8 kg)    Physical Exam Constitutional:      General: She is not in acute distress.    Appearance: She is well-developed. She is not diaphoretic.  HENT:     Head: Normocephalic and atraumatic.  Eyes:     Conjunctiva/sclera: Conjunctivae normal.     Pupils: Pupils are equal, round, and reactive to light.  Cardiovascular:     Rate and Rhythm: Normal rate and regular rhythm.     Heart sounds: Normal heart sounds.  Pulmonary:     Effort: Pulmonary effort is normal.     Breath sounds: Normal breath sounds.  Musculoskeletal:        General: No tenderness.     Cervical back: Normal range of motion and neck supple.  Skin:    General: Skin is warm and dry.  Neurological:     Mental Status: She is alert and oriented to person, place, and time.  Psychiatric:        Mood and Affect: Mood normal.        Behavior: Behavior normal.     Labs reviewed: Basic Metabolic Panel: Recent Labs    09/06/20 1223  NA 141  K 4.6  CL 104  CO2 26  GLUCOSE 86  BUN 13  CREATININE 0.76  CALCIUM 9.9   Liver Function Tests: Recent Labs    09/06/20 1223  AST 14  ALT 8  BILITOT 0.4  PROT 7.0   No results for input(s): LIPASE, AMYLASE in the last 8760 hours. No results for input(s): AMMONIA in the last 8760 hours. CBC: Recent Labs    09/06/20 1223  WBC 5.7  NEUTROABS 2,132  HGB 12.4  HCT 39.4  MCV 85.5  PLT 277   Lipid Panel: Recent Labs    09/06/20 1223  CHOL 324*  HDL 50  LDLCALC 222*  TRIG 309*  CHOLHDL 6.5*   TSH: No results for input(s): TSH in the last 8760 hours. A1C: Lab Results  Component Value Date   HGBA1C 6.7 (H) 09/06/2020     Assessment/Plan 1. Mixed hyperlipidemia -to stop crestor due to side effects -to start Lipitor 10 mg daily  - aspirin EC 81 MG tablet; Take 1 tablet (81 mg total) by mouth daily. Swallow whole.  Dispense: 30  tablet; Refill: 11  2. Primary hypertension Improved on recheck to 136/78 -continue dietary modifications with losartan 25 mg daily  - aspirin EC 81 MG tablet; Take 1 tablet (81  mg total) by mouth daily. Swallow whole.  Dispense: 30 tablet; Refill: 11  Next appt: 3 months for routine follow up [place]. [place], [facility] [phone]

## 2020-09-26 ENCOUNTER — Other Ambulatory Visit: Payer: Self-pay | Admitting: Nurse Practitioner

## 2020-09-26 DIAGNOSIS — R921 Mammographic calcification found on diagnostic imaging of breast: Secondary | ICD-10-CM

## 2020-10-03 ENCOUNTER — Other Ambulatory Visit: Payer: Self-pay | Admitting: Nurse Practitioner

## 2020-10-03 DIAGNOSIS — E1169 Type 2 diabetes mellitus with other specified complication: Secondary | ICD-10-CM

## 2020-10-03 DIAGNOSIS — I10 Essential (primary) hypertension: Secondary | ICD-10-CM

## 2020-10-07 ENCOUNTER — Other Ambulatory Visit: Payer: Self-pay | Admitting: Nurse Practitioner

## 2020-10-15 ENCOUNTER — Other Ambulatory Visit: Payer: Self-pay | Admitting: Nurse Practitioner

## 2020-10-15 DIAGNOSIS — E782 Mixed hyperlipidemia: Secondary | ICD-10-CM

## 2020-10-25 ENCOUNTER — Encounter: Payer: Self-pay | Admitting: Nurse Practitioner

## 2020-10-29 DIAGNOSIS — Z03818 Encounter for observation for suspected exposure to other biological agents ruled out: Secondary | ICD-10-CM | POA: Diagnosis not present

## 2020-10-31 ENCOUNTER — Other Ambulatory Visit: Payer: Self-pay | Admitting: Nurse Practitioner

## 2020-10-31 DIAGNOSIS — I10 Essential (primary) hypertension: Secondary | ICD-10-CM

## 2020-10-31 DIAGNOSIS — E1169 Type 2 diabetes mellitus with other specified complication: Secondary | ICD-10-CM

## 2020-11-01 ENCOUNTER — Other Ambulatory Visit: Payer: Self-pay | Admitting: Nurse Practitioner

## 2020-11-01 ENCOUNTER — Other Ambulatory Visit: Payer: Medicare HMO

## 2020-11-01 ENCOUNTER — Other Ambulatory Visit: Payer: Self-pay

## 2020-11-01 DIAGNOSIS — E782 Mixed hyperlipidemia: Secondary | ICD-10-CM | POA: Diagnosis not present

## 2020-11-01 DIAGNOSIS — E783 Hyperchylomicronemia: Secondary | ICD-10-CM | POA: Diagnosis not present

## 2020-11-01 DIAGNOSIS — E785 Hyperlipidemia, unspecified: Secondary | ICD-10-CM

## 2020-11-01 DIAGNOSIS — E1169 Type 2 diabetes mellitus with other specified complication: Secondary | ICD-10-CM

## 2020-11-01 DIAGNOSIS — I10 Essential (primary) hypertension: Secondary | ICD-10-CM

## 2020-11-06 LAB — COMPLETE METABOLIC PANEL WITH GFR
AG Ratio: 1.9 (calc) (ref 1.0–2.5)
ALT: 9 U/L (ref 6–29)
AST: 16 U/L (ref 10–35)
Albumin: 4.4 g/dL (ref 3.6–5.1)
Alkaline phosphatase (APISO): 58 U/L (ref 37–153)
BUN: 13 mg/dL (ref 7–25)
CO2: 29 mmol/L (ref 20–32)
Calcium: 9.6 mg/dL (ref 8.6–10.4)
Chloride: 104 mmol/L (ref 98–110)
Creat: 0.75 mg/dL (ref 0.60–0.93)
GFR, Est African American: 93 mL/min/{1.73_m2} (ref >=60)
GFR, Est Non African American: 80 mL/min/{1.73_m2} (ref >=60)
Globulin: 2.3 g/dL (calc) (ref 1.9–3.7)
Glucose, Bld: 89 mg/dL (ref 65–99)
Potassium: 4.6 mmol/L (ref 3.5–5.3)
Sodium: 141 mmol/L (ref 135–146)
Total Bilirubin: 0.4 mg/dL (ref 0.2–1.2)
Total Protein: 6.7 g/dL (ref 6.1–8.1)

## 2020-11-06 LAB — TEST AUTHORIZATION

## 2020-11-06 LAB — LIPID PANEL
Cholesterol: 209 mg/dL — ABNORMAL HIGH (ref <200)
HDL: 63 mg/dL (ref >=50)
LDL Cholesterol (Calc): 125 mg/dL (calc) — ABNORMAL HIGH
Non-HDL Cholesterol (Calc): 146 mg/dL (calc) — ABNORMAL HIGH (ref <130)
Total CHOL/HDL Ratio: 3.3 (calc) (ref <5.0)
Triglycerides: 107 mg/dL (ref <150)

## 2020-11-13 ENCOUNTER — Ambulatory Visit: Payer: Medicare HMO | Admitting: Nurse Practitioner

## 2020-12-02 DIAGNOSIS — Z03818 Encounter for observation for suspected exposure to other biological agents ruled out: Secondary | ICD-10-CM | POA: Diagnosis not present

## 2020-12-02 DIAGNOSIS — Z20822 Contact with and (suspected) exposure to covid-19: Secondary | ICD-10-CM | POA: Diagnosis not present

## 2020-12-11 ENCOUNTER — Ambulatory Visit: Payer: Medicare HMO | Admitting: Nurse Practitioner

## 2020-12-13 ENCOUNTER — Telehealth: Payer: Self-pay | Admitting: *Deleted

## 2020-12-13 ENCOUNTER — Encounter: Payer: Self-pay | Admitting: Nurse Practitioner

## 2020-12-13 ENCOUNTER — Ambulatory Visit (INDEPENDENT_AMBULATORY_CARE_PROVIDER_SITE_OTHER): Payer: Medicare HMO | Admitting: Nurse Practitioner

## 2020-12-13 ENCOUNTER — Other Ambulatory Visit: Payer: Self-pay

## 2020-12-13 VITALS — BP 150/80 | HR 80 | Temp 97.1°F | Ht 67.0 in | Wt 133.8 lb

## 2020-12-13 DIAGNOSIS — R195 Other fecal abnormalities: Secondary | ICD-10-CM | POA: Diagnosis not present

## 2020-12-13 DIAGNOSIS — I10 Essential (primary) hypertension: Secondary | ICD-10-CM | POA: Diagnosis not present

## 2020-12-13 DIAGNOSIS — R1030 Lower abdominal pain, unspecified: Secondary | ICD-10-CM

## 2020-12-13 DIAGNOSIS — K5900 Constipation, unspecified: Secondary | ICD-10-CM

## 2020-12-13 DIAGNOSIS — R300 Dysuria: Secondary | ICD-10-CM

## 2020-12-13 LAB — POCT URINALYSIS DIPSTICK
Bilirubin, UA: NEGATIVE
Blood, UA: NEGATIVE
Glucose, UA: NEGATIVE
Ketones, UA: NEGATIVE
Nitrite, UA: NEGATIVE
Protein, UA: NEGATIVE
Spec Grav, UA: 1.01 (ref 1.010–1.025)
Urobilinogen, UA: 0.2 E.U./dL
pH, UA: 7.5 (ref 5.0–8.0)

## 2020-12-13 MED ORDER — NITROFURANTOIN MONOHYD MACRO 100 MG PO CAPS: 100.0000 mg | ORAL_CAPSULE | Freq: Two times a day (BID) | ORAL | 0 refills | Status: DC

## 2020-12-13 NOTE — Patient Instructions (Signed)
Continue to stay hydrated with water  Start macrobid by mouth twice daily

## 2020-12-13 NOTE — Progress Notes (Signed)
Careteam: Patient Care Team: [name], NP as PCP - General (Geriatric Medicine)  PLACE OF SERVICE:  [place] Directive information    No Known Allergies  Chief Complaint  Patient presents with  . Acute Visit    Pain in lower abdomen in pelvic area like cramps with slight burning after urination. Patient states that she returned from [place] on the 15th and was very constipated strained herself and saw blood and has had pain ever since. No urinary frequency. No fever. No urinary urgency. Been going on for about 2 weeks.     HPI: Patient is a 70 y.o. female here for follow-up.  Lower abdominal pain- States she was experiencing constipation during her trip to [place] two weeks ago. She began straining when lower abdominal pain started to develop. Also complains of dysuria. Pain comes and goes, more pronounced after she urinates. Rates pain 5/10, describes it as pressure/dull cramp. Pain does not radiate anywhere else. Denies fever, urgency, frequency, or vaginal discharge.    Review of Systems:  Review of Systems  Constitutional: Negative for chills, fever and weight loss.  Respiratory: Negative for cough, sputum production and shortness of breath.   Cardiovascular: Negative for chest pain, palpitations and leg swelling.  Gastrointestinal: Positive for abdominal pain (lower, describes as a dull cramp). Negative for constipation, nausea and vomiting.  Genitourinary: Positive for dysuria. Negative for frequency and urgency.  Musculoskeletal: Negative for back pain, falls and myalgias.  Skin: Negative.   Neurological: Negative for dizziness, weakness and headaches.    Past Medical History:  Diagnosis Date  . Cervical radiculopathy   . Fatigue   . Fatigue   . Generalized osteoarthrosis   . Generalized osteoarthrosis   . Hyperlipidemia   . Lateral epicondylitis of left elbow 06/28/2017  . Left knee pain   . Menopause   . Neck pain   . Pre-diabetes    . Sciatica   . Swelling of left knee joint    Past Surgical History:  Procedure Laterality Date  . ABCESS DRAINAGE Left 09/15/2017   behind tonsil  . VAGINAL HYSTERECTOMY  1983   Social History:   reports that she has been smoking cigarettes. She has a 12.50 pack-year smoking history. She has never used smokeless tobacco. She reports that she does not drink alcohol and does not use drugs.  Family History  Problem Relation Age of Onset  . Stroke Mother   . Diabetes Mother   . Dementia Mother   . Cancer Sister        unknown  . Cancer Sister        unknown  . Kidney failure Brother   . Cancer Brother   . Cancer Brother     Medications: Patient's Medications  New Prescriptions   No medications on file  Previous Medications   ACETAMINOPHEN (TYLENOL) 500 MG TABLET    Take 500 mg by mouth daily as needed.   ASPIRIN EC 81 MG TABLET    Take 1 tablet (81 mg total) by mouth daily. Swallow whole.   ATORVASTATIN (LIPITOR) 10 MG TABLET    TAKE 1 TABLET(10 MG) BY MOUTH DAILY   CALCIUM CITRATE-VITAMIN D (CALCIUM CITRATE + D PO)    Take 1 tablet by mouth daily.   DIPHENHYDRAMINE (BENADRYL) 25 MG TABLET    Take 25 mg by mouth as needed.    FLUTICASONE (FLONASE) 50 MCG/ACT NASAL SPRAY    SHAKE LIQUID AND USE 1 SPRAY IN [facility]  NOSTRIL TWICE DAILY   GLUCOSAMINE-CHONDROITIN 500-400 MG TABLET    Take 1 tablet by mouth daily.   LOSARTAN (COZAAR) 25 MG TABLET    TAKE 1 TABLET(25 MG) BY MOUTH DAILY   MISC NATURAL PRODUCTS (COLON CLEANSE) CAPS    Take by mouth. As needed   VITAMIN E 400 UNIT CAPSULE    Take 1,200 Units by mouth daily.   Modified Medications   No medications on file  Discontinued Medications   No medications on file    Physical Exam:  Vitals:   12/13/20 1106  BP: (!) 150/80  Pulse: 80  Temp: (!) 97.1 F (36.2 C)  TempSrc: Temporal  SpO2: 98%  Weight: 133 lb 12.8 oz (60.7 kg)  Height: _0  (1.702 m)   There is no height or weight on file to calculate BMI. Wt  Readings from Last 3 Encounters:  09/20/20 136 lb (61.7 kg)  09/06/20 134 lb (60.8 kg)  11/16/18 140 lb 9.6 oz (63.8 kg)    Physical Exam Constitutional:      General: She is not in acute distress.    Appearance: Normal appearance. She is not diaphoretic.  HENT:     Head: Normocephalic and atraumatic.  Cardiovascular:     Rate and Rhythm: Normal rate and regular rhythm.     Heart sounds: Normal heart sounds.  Pulmonary:     Effort: Pulmonary effort is normal.     Breath sounds: Normal breath sounds.  Abdominal:     General: Bowel sounds are normal.     Palpations: Abdomen is soft.     Tenderness: There is no right CVA tenderness or left CVA tenderness.  Musculoskeletal:        General: No swelling or tenderness. Normal range of motion.  Skin:    General: Skin is warm and dry.  Neurological:     Mental Status: She is alert and oriented to person, place, and time.     Labs reviewed: Basic Metabolic Panel: Recent Labs    09/06/20 1223 11/01/20 0804  NA 141 141  K 4.6 4.6  CL 104 104  CO2 26 29  GLUCOSE 86 89  BUN 13 13  CREATININE 0.76 0.75  CALCIUM 9.9 9.6   Liver Function Tests: Recent Labs    09/06/20 1223 11/01/20 0804  AST 14 16  ALT 8 9  BILITOT 0.4 0.4  PROT 7.0 6.7   No results for input(s): LIPASE, AMYLASE in the last 8760 hours. No results for input(s): AMMONIA in the last 8760 hours. CBC: Recent Labs    09/06/20 1223  WBC 5.7  NEUTROABS 2,132  HGB 12.4  HCT 39.4  MCV 85.5  PLT 277   Lipid Panel: Recent Labs    09/06/20 1223 11/01/20 0804  CHOL 324* 209*  HDL 50 63  LDLCALC 222* 125*  TRIG 309* 107  CHOLHDL 6.5* 3.3   TSH: No results for input(s): TSH in the last 8760 hours. A1C: Lab Results  Component Value Date   HGBA1C 6.7 (H) 09/06/2020     Assessment/Plan 1. Dysuria Experiencing lower abdominal pain and pressure, concerned for UTI. Will order urine culture and check labs for possible infection.  - POC Urinalysis  Dipstick- negative.  - Culture, Urine - CBC with Differential/Platelet - CMP with eGFR([facility]) -pt with limited expenses for the rest of the month. Will await culture and lab work at this time before prescribing medication.  -encouraged to increase hydration.   2. Positive colorectal cancer screening  using Cologuard test Has made an appointment with Gastrology regarding colonoscopy. Has had a positive cologuard test.  3. Constipation, unspecified constipation type Has resolved at this time.   4. Hypertension Taking losartan 25 mg daily, repeat BP 140/96. She reports bp well controlled at home sbp in the 120-130s when she checks. Continues on losartan at this time.   Next appt: 12/13/2020 Carlos American. [place], [facility] [phone]

## 2020-12-13 NOTE — Telephone Encounter (Signed)
LMOM to return call.

## 2020-12-13 NOTE — Telephone Encounter (Signed)
Patient called and stated that she was just seen and given Nitrofurantoin. Stated that it is going to cost her $13 out of pocket and she cannot afford that and wants to know if you can call something cheaper in.  Stated that she has $20 to last her the rest of the month.  Please Advise.

## 2020-12-13 NOTE — Telephone Encounter (Signed)
Lets have her await the culture findings that this time if there is an issue with affording medication.

## 2020-12-13 NOTE — Telephone Encounter (Signed)
Patient notified and agreed.  

## 2020-12-14 LAB — COMPLETE METABOLIC PANEL WITH GFR
AG Ratio: 1.9 (calc) (ref 1.0–2.5)
ALT: 8 U/L (ref 6–29)
AST: 14 U/L (ref 10–35)
Albumin: 4.4 g/dL (ref 3.6–5.1)
Alkaline phosphatase (APISO): 56 U/L (ref 37–153)
BUN: 12 mg/dL (ref 7–25)
CO2: 27 mmol/L (ref 20–32)
Calcium: 10.1 mg/dL (ref 8.6–10.4)
Chloride: 106 mmol/L (ref 98–110)
Creat: 0.75 mg/dL (ref 0.60–0.93)
GFR, Est African American: 93 mL/min/{1.73_m2} (ref >=60)
GFR, Est Non African American: 80 mL/min/{1.73_m2} (ref >=60)
Globulin: 2.3 g/dL (calc) (ref 1.9–3.7)
Glucose, Bld: 92 mg/dL (ref 65–139)
Potassium: 4.4 mmol/L (ref 3.5–5.3)
Sodium: 141 mmol/L (ref 135–146)
Total Bilirubin: 0.3 mg/dL (ref 0.2–1.2)
Total Protein: 6.7 g/dL (ref 6.1–8.1)

## 2020-12-14 LAB — URINE CULTURE
MICRO NUMBER:: 11581120
Result:: NO GROWTH
SPECIMEN QUALITY:: ADEQUATE

## 2020-12-14 LAB — CBC WITH DIFFERENTIAL/PLATELET
Absolute Monocytes: 502 cells/uL (ref 200–950)
Basophils Absolute: 47 cells/uL (ref 0–200)
Basophils Relative: 0.8 %
Eosinophils Absolute: 83 cells/uL (ref 15–500)
Eosinophils Relative: 1.4 %
HCT: 38.6 % (ref 35.0–45.0)
Hemoglobin: 12.6 g/dL (ref 11.7–15.5)
Lymphs Abs: 3044 cells/uL (ref 850–3900)
MCH: 27.6 pg (ref 27.0–33.0)
MCHC: 32.6 g/dL (ref 32.0–36.0)
MCV: 84.6 fL (ref 80.0–100.0)
MPV: 11.7 fL (ref 7.5–12.5)
Monocytes Relative: 8.5 %
Neutro Abs: 2224 cells/uL (ref 1500–7800)
Neutrophils Relative %: 37.7 %
Platelets: 263 10*3/uL (ref 140–400)
RBC: 4.56 10*6/uL (ref 3.80–5.10)
RDW: 14.2 % (ref 11.0–15.0)
Total Lymphocyte: 51.6 %
WBC: 5.9 10*3/uL (ref 3.8–10.8)

## 2020-12-17 ENCOUNTER — Encounter: Payer: Self-pay | Admitting: Gastroenterology

## 2020-12-17 ENCOUNTER — Ambulatory Visit: Payer: Medicare HMO | Admitting: Gastroenterology

## 2020-12-17 VITALS — BP 150/72 | HR 92 | Ht 65.75 in | Wt 133.1 lb

## 2020-12-17 DIAGNOSIS — R195 Other fecal abnormalities: Secondary | ICD-10-CM | POA: Diagnosis not present

## 2020-12-17 MED ORDER — SUPREP BOWEL PREP KIT 17.5-3.13-1.6 GM/177ML PO SOLN: 1.0000 | ORAL | 0 refills | Status: DC

## 2020-12-17 NOTE — Progress Notes (Signed)
[facility] Consult Note:  History: Emily Hayes 12/17/2020  Referring provider: [name], NP  Reason for consult/chief complaint: positive Cologuard (2 years ago)   Subjective  HPI:  This is a very pleasant 70 year old woman referred to [place] for a positive Cologuard test that was done in May 2020.  She did not wish to have a colonoscopy afterwards due to concerns of Covid risk, but was now here to talk about it.  Emily Hayes has occasional constipation when she travels, but otherwise feels well and is typically regular and has no abdominal pain, nausea vomiting dysphagia odynophagia or weight loss.  If she has episodic constipation the may be some blood on the paper but no frank rectal bleeding.  She wonders if that may have been the reason for a positive Cologuard.   ROS:  Review of Systems  Constitutional: Negative for appetite change and unexpected weight change.  HENT: Negative for mouth sores and voice change.   Eyes: Negative for pain and redness.  Respiratory: Negative for cough and shortness of breath.   Cardiovascular: Negative for chest pain and palpitations.  Genitourinary: Positive for frequency. Negative for dysuria and hematuria.  Musculoskeletal: Negative for arthralgias and myalgias.  Skin: Negative for pallor and rash.  Neurological: Negative for weakness and headaches.  Hematological: Negative for adenopathy.     Past Medical History: Past Medical History:  Diagnosis Date  . Cervical radiculopathy   . Fatigue   . Generalized osteoarthrosis   . HTN (hypertension)   . Hyperlipidemia   . Lateral epicondylitis of left elbow 06/28/2017  . Left knee pain   . Menopause   . Neck pain   . Pneumonia   . Pre-diabetes   . Sciatica   . Swelling of left knee joint   . Uterine cancer [facility])      Past Surgical History: Past Surgical History:  Procedure Laterality Date  . ABCESS DRAINAGE Left 09/15/2017   behind tonsil  .  APPENDECTOMY    . VAGINAL HYSTERECTOMY  1983     Family History: Family History  Problem Relation Age of Onset  . Stroke Mother   . Diabetes Mother   . Dementia Mother   . Alzheimer's disease Mother   . Colon polyps Brother   . Hypertension Brother   . Hyperlipidemia Brother   . Cancer Sister        unknown  . Cancer Sister        unknown  . Kidney failure Brother   . Cancer Sister        unknown  . Lung cancer Brother   . Cancer Sister        unknown    Social History: Social History   Socioeconomic History  . Marital status: Divorced    Spouse name: Not on file  . Number of children: 2  . Years of education: Not on file  . Highest education level: Not on file  Occupational History  . Occupation: retired  Tobacco Use  . Smoking status: Current Every Day Smoker    Packs/day: 0.25    Years: 50.00    Pack years: 12.50    Types: Cigarettes  . Smokeless tobacco: Never Used  Vaping Use  . Vaping Use: Never used  Substance and Sexual Activity  . Alcohol use: No  . Drug use: No  . Sexual activity: Not on file  Other Topics Concern  . Not on file  Social History Narrative   Social History  Diet? none      Do you drink/eat things with caffeine? Chocolate- occasionally      Marital status?        divorced                            What year were you married? 1982      Do you live in a house, apartment, assisted living, condo, trailer, etc.? apartment      Is it one or more stories? 1      How many persons live in your home? 1       Do you have any pets in your home? (please list) yes, dog      Highest level of education completed? Associate degree- college      Current or past profession: retired      Do you exercise?           yes                           Type & how often? Walk- daily      Advanced Directives      Do you have a living will? yes      Do you have a DNR form?     yes                             If not, do you want to discuss  one? yes      Do you have signed POA/HPOA for forms? no      Functional Status      Do you have difficulty bathing or dressing yourself?      Do you have difficulty preparing food or eating?       Do you have difficulty managing your medications?      Do you have difficulty managing your finances?      Do you have difficulty affording your medications?   Social Determinants of Health   Financial Resource Strain: Not on file  Food Insecurity: Not on file  Transportation Needs: Not on file  Physical Activity: Not on file  Stress: Not on file  Social Connections: Not on file    Allergies: No Known Allergies  Outpatient Meds: Current Outpatient Medications  Medication Sig Dispense Refill  . acetaminophen (TYLENOL) 500 MG tablet Take 500 mg by mouth daily as needed.    Marland Kitchen aspirin EC 81 MG tablet Take 1 tablet (81 mg total) by mouth daily. Swallow whole. 30 tablet 11  . atorvastatin (LIPITOR) 10 MG tablet TAKE 1 TABLET(10 MG) BY MOUTH DAILY 90 tablet 0  . Calcium Citrate-Vitamin D (CALCIUM CITRATE + D PO) Take 1 tablet by mouth daily.    . diphenhydrAMINE (BENADRYL) 25 MG tablet Take 25 mg by mouth as needed.     . fluticasone (FLONASE) 50 MCG/ACT nasal spray SHAKE LIQUID AND USE 1 SPRAY IN EACH NOSTRIL TWICE DAILY (Patient taking differently: As needed) 16 g 3  . glucosamine-chondroitin 500-400 MG tablet Take 1 tablet by mouth daily.    Marland Kitchen losartan (COZAAR) 25 MG tablet TAKE 1 TABLET(25 MG) BY MOUTH DAILY 30 tablet 1  . Misc Natural Products (COLON CLEANSE) CAPS Take by mouth. As needed    . SUPREP BOWEL PREP KIT 17.5-3.13-1.6 GM/177ML SOLN Take 1 kit by mouth as directed. For colonoscopy prep 354 mL 0  .  vitamin E 400 UNIT capsule Take 1,200 Units by mouth daily.      No current facility-administered medications for this visit.      ___________________________________________________________________ Objective   Exam:  BP (!) 150/72 (BP Location: Left Arm, Patient  Position: Sitting, Cuff Size: Normal)   Pulse 92   Ht 5' 5.75" (1.67 m) Comment: height measured without shoes  Wt 133 lb 2 oz (60.4 kg)   BMI 21.65 kg/m  Wt Readings from Last 3 Encounters:  12/17/20 133 lb 2 oz (60.4 kg)  12/13/20 133 lb 12.8 oz (60.7 kg)  09/20/20 136 lb (61.7 kg)     General: Well-appearing  Eyes: sclera anicteric, no redness  ENT: oral mucosa moist without lesions, no cervical or supraclavicular lymphadenopathy  CV: RRR without murmur, S1/S2, no JVD, no peripheral edema  Resp: clear to auscultation bilaterally, normal RR and effort noted  GI: soft, no tenderness, with active bowel sounds. No guarding or palpable organomegaly noted.  Skin; warm and dry, no rash or jaundice noted  Neuro: awake, alert and oriented x 3. Normal gross motor function and fluent speech  Labs:  CMP Latest Ref Rng & Units 12/13/2020 11/01/2020 09/06/2020  Glucose 65 - 139 mg/dL 92 89 86  BUN 7 - 25 mg/dL _0 Creatinine 0.60 - 0.93 mg/dL 0.75 0.75 0.76  Sodium 135 - 146 mmol/L 141 141 141  Potassium 3.5 - 5.3 mmol/L 4.4 4.6 4.6  Chloride 98 - 110 mmol/L 106 104 104  CO2 20 - 32 mmol/L _1 Calcium 8.6 - 10.4 mg/dL 10.1 9.6 9.9  Total Protein 6.1 - 8.1 g/dL 6.7 6.7 7.0  Total Bilirubin 0.2 - 1.2 mg/dL 0.3 0.4 0.4  Alkaline Phos 25 - 125 - - -  AST 10 - 35 U/L _2 ALT 6 - 29 U/L _3 CBC Latest Ref Rng & Units 12/13/2020 09/06/2020 01/05/2018  WBC 3.8 - 10.8 Thousand/uL 5.9 5.7 5.2  Hemoglobin 11.7 - 15.5 g/dL 12.6 12.4 12.2  Hematocrit 35.0 - 45.0 % 38.6 39.4 37.1  Platelets 140 - 400 Thousand/uL 263 277 281    Positive cologuard May 2020   Assessment: Encounter Diagnosis  Name Primary?  . Positive colorectal cancer screening using Cologuard test Yes    We discussed that positive Cologuard can either be from occult blood or indicative of precancerous polyps.  Colonoscopy is recommended, and she was agreeable after discussion of procedure and  risks.  She was familiar with the procedure from having it done in [facility] in about 2011, and she recalls that that exam was normal.   The benefits and risks of the planned procedure were described in detail with the patient or (when appropriate) their health care proxy.  Risks were outlined as including, but not limited to, bleeding, infection, perforation, adverse medication reaction leading to cardiac or pulmonary decompensation, pancreatitis (if ERCP).  The limitation of incomplete mucosal visualization was also discussed.  No guarantees or warranties were given.   Thank you for the courtesy of this consult.  Please call me with any questions or concerns.  Nelida Meuse III  CC: Referring provider noted above

## 2020-12-17 NOTE — Patient Instructions (Signed)
If you are age 72 or older, your body mass index should be between 23-30. Your Body mass index is 21.65 kg/m. If this is out of the aforementioned range listed, please consider follow up with your Primary Care Provider.  If you are age 46 or younger, your body mass index should be between 19-25. Your Body mass index is 21.65 kg/m. If this is out of the aformentioned range listed, please consider follow up with your Primary Care Provider.   PROCEDURES:  You have been scheduled for a colonoscopy. Please follow the written instructions given to you at your visit today. Please pick up your prep supplies at the pharmacy within the next 1-3 days. If you use inhalers (even only as needed), please bring them with you on the day of your procedure.  It was great seeing you today! Thank you for entrusting me with your care and choosing [facility].  Dr. Loletha Carrow

## 2020-12-18 ENCOUNTER — Telehealth: Payer: Self-pay | Admitting: Gastroenterology

## 2020-12-18 NOTE — Telephone Encounter (Signed)
I had a long conversation with this patient yesterday while going over her prep instructions. I told her if there was ANY issue with the prep, pharmacy, or cost, she was to send me a MyChart message and I will give her a new prep.  Please see MyChart message I have sent letting her know she can come by the clinic and pick up a sample of PLENVU.

## 2020-12-18 NOTE — Telephone Encounter (Signed)
Inbound call from patient requesting a call please.  States she received a message from the pharmacy stating they do not have prep medication in stock and not sure when they will receive it.

## 2020-12-20 ENCOUNTER — Ambulatory Visit: Payer: Medicare HMO | Admitting: Nurse Practitioner

## 2020-12-31 ENCOUNTER — Encounter: Payer: Self-pay | Admitting: Certified Registered Nurse Anesthetist

## 2020-12-31 ENCOUNTER — Encounter: Payer: Self-pay | Admitting: Gastroenterology

## 2020-12-31 ENCOUNTER — Other Ambulatory Visit: Payer: Self-pay | Admitting: Nurse Practitioner

## 2020-12-31 DIAGNOSIS — I10 Essential (primary) hypertension: Secondary | ICD-10-CM

## 2020-12-31 DIAGNOSIS — E785 Hyperlipidemia, unspecified: Secondary | ICD-10-CM

## 2020-12-31 DIAGNOSIS — E1169 Type 2 diabetes mellitus with other specified complication: Secondary | ICD-10-CM

## 2021-01-01 ENCOUNTER — Encounter: Payer: Self-pay | Admitting: Gastroenterology

## 2021-01-01 ENCOUNTER — Ambulatory Visit (AMBULATORY_SURGERY_CENTER): Payer: Medicare HMO | Admitting: Gastroenterology

## 2021-01-01 ENCOUNTER — Other Ambulatory Visit: Payer: Self-pay

## 2021-01-01 VITALS — BP 145/73 | HR 81 | Temp 97.1°F | Resp 22 | Ht 65.0 in | Wt 133.0 lb

## 2021-01-01 DIAGNOSIS — K635 Polyp of colon: Secondary | ICD-10-CM

## 2021-01-01 DIAGNOSIS — D122 Benign neoplasm of ascending colon: Secondary | ICD-10-CM

## 2021-01-01 DIAGNOSIS — R195 Other fecal abnormalities: Secondary | ICD-10-CM | POA: Diagnosis not present

## 2021-01-01 MED ORDER — SODIUM CHLORIDE 0.9 % IV SOLN
500.0000 mL | Freq: Once | INTRAVENOUS | Status: DC
Start: 2021-01-01 — End: 2021-01-01

## 2021-01-01 NOTE — Progress Notes (Signed)
0940 Ephedrine 10 mg given IV due to low BP, MD updated.   °

## 2021-01-01 NOTE — Progress Notes (Signed)
Called to room to assist during endoscopic procedure.  Patient ID and intended procedure confirmed with present staff. Received instructions for my participation in the procedure from the performing physician.  

## 2021-01-01 NOTE — Progress Notes (Signed)
VS by CW  Pt's states no medical or surgical changes since previsit or office visit.  

## 2021-01-01 NOTE — Progress Notes (Signed)
0934 HR < 40 with Robinul 0.2 mg given IV. MD updated, vss  

## 2021-01-01 NOTE — Patient Instructions (Signed)
Please read handouts provided. Continue present medications. Await pathology results.   YOU HAD AN ENDOSCOPIC PROCEDURE TODAY AT THE [facility]:   Refer to the procedure report that was given to you for any specific questions about what was found during the examination.  If the procedure report does not answer your questions, please call your gastroenterologist to clarify.  If you requested that your care partner not be given the details of your procedure findings, then the procedure report has been included in a sealed envelope for you to review at your convenience later.  YOU SHOULD EXPECT: Some feelings of bloating in the abdomen. Passage of more gas than usual.  Walking can help get rid of the air that was put into your GI tract during the procedure and reduce the bloating. If you had a lower endoscopy (such as a colonoscopy or flexible sigmoidoscopy) you may notice spotting of blood in your stool or on the toilet paper. If you underwent a bowel prep for your procedure, you may not have a normal bowel movement for a few days.  Please Note:  You might notice some irritation and congestion in your nose or some drainage.  This is from the oxygen used during your procedure.  There is no need for concern and it should clear up in a day or so.  SYMPTOMS TO REPORT IMMEDIATELY:  Following lower endoscopy (colonoscopy or flexible sigmoidoscopy):  Excessive amounts of blood in the stool  Significant tenderness or worsening of abdominal pains  Swelling of the abdomen that is new, acute  Fever of 100F or higher   For urgent or emergent issues, a gastroenterologist can be reached at any hour by calling [phone]. Do not use MyChart messaging for urgent concerns.    DIET:  We do recommend a small meal at first, but then you may proceed to your regular diet.  Drink plenty of fluids but you should avoid alcoholic beverages for 24 hours.  ACTIVITY:  You should plan to take it easy  for the rest of today and you should NOT DRIVE or use heavy machinery until tomorrow (because of the sedation medicines used during the test).    FOLLOW UP: Our staff will call the number listed on your records 48-72 hours following your procedure to check on you and address any questions or concerns that you may have regarding the information given to you following your procedure. If we do not reach you, we will leave a message.  We will attempt to reach you two times.  During this call, we will ask if you have developed any symptoms of COVID 19. If you develop any symptoms (ie: fever, flu-like symptoms, shortness of breath, cough etc.) before then, please call [phone].  If you test positive for Covid 19 in the 2 weeks post procedure, please call and report this information to us.    If any biopsies were taken you will be contacted by phone or by letter within the next 1-3 weeks.  Please call us at [phone] if you have not heard about the biopsies in 3 weeks.    SIGNATURES/CONFIDENTIALITY: You and/or your care partner have signed paperwork which will be entered into your electronic medical record.  These signatures attest to the fact that that the information above on your After Visit Summary has been reviewed and is understood.  Full responsibility of the confidentiality of this discharge information lies with you and/or your care-partner.  

## 2021-01-01 NOTE — Op Note (Signed)
Emily Hayes Patient Name: Emily Hayes Procedure Date: 01/01/2021 9:21 AM MRN: [id number] Endoscopist: Mallie Mussel L. Loletha Carrow , MD Age: 70 Referring MD:  Date of Birth: 09-29-1951 Gender: Female Account #: [id number] Procedure:                Colonoscopy Indications:              Positive Cologuard test (May 2020) Medicines:                Monitored Anesthesia Care Procedure:                Pre-Anesthesia Assessment:                           - Prior to the procedure, a History and Physical                            was performed, and patient medications and                            allergies were reviewed. The patient's tolerance of                            previous anesthesia was also reviewed. The risks                            and benefits of the procedure and the sedation                            options and risks were discussed with the patient.                            All questions were answered, and informed consent                            was obtained. Prior Anticoagulants: The patient has                            taken no previous anticoagulant or antiplatelet                            agents. ASA Grade Assessment: II - A patient with                            mild systemic disease. After reviewing the risks                            and benefits, the patient was deemed in                            satisfactory condition to undergo the procedure.                           After obtaining informed consent, the colonoscope  was passed under direct vision. Throughout the                            procedure, the patient's blood pressure, pulse, and                            oxygen saturations were monitored continuously. The                            Olympus [id number]) Colonoscope was                            introduced through the anus and advanced to the the                            cecum, identified by  appendiceal orifice and                            ileocecal valve. The colonoscopy was extremely                            difficult due to a redundant colon, significant                            looping and a tortuous colon. Successful completion                            of the procedure was aided by changing the patient                            to a supine position and using manual pressure and                            water inflation. The quality of the bowel                            preparation was excellent. The ileocecal valve,                            appendiceal orifice, and rectum were photographed.                            The bowel preparation used was Plenvu. Scope In: 9:26:15 AM Scope Out: 9:55:57 AM Scope Withdrawal Time: 0 hours 17 minutes 15 seconds  Total Procedure Duration: 0 hours 29 minutes 42 seconds  Findings:                 The perianal and digital rectal examinations were                            normal.                           The colon (entire examined portion) was redundant  and tortuous, especially in the distal sigmoid,                            makling scope passage through this area very                            challenging (see above).                           A 4-5 mm polyp was found in the proximal ascending                            colon. The polyp was sessile. The polyp was removed                            with a cold snare. Resection and retrieval were                            complete. Additional snare tip cautery applied to                            the polypectomy site edge to control bleeding.                           The exam was otherwise without abnormality on                            direct and retroflexion views. Complications:            No immediate complications. Estimated Blood Loss:     Estimated blood loss was minimal. Impression:               - Redundant colon.                            - One 4 mm polyp in the proximal ascending colon,                            removed with a cold snare. Resected and retrieved.                           - The examination was otherwise normal on direct                            and retroflexion views. Recommendation:           - Patient has a contact number available for                            emergencies. The signs and symptoms of potential                            delayed complications were discussed with the  patient. Return to normal activities tomorrow.                            Written discharge instructions were provided to the                            patient.                           - Resume previous diet.                           - Continue present medications.                           - Await pathology results.                           - No recommendation at this time regarding repeat                            colonoscopy (pending pathology report). Henry L. Loletha Carrow, MD 01/01/2021 10:05:59 AM This report has been signed electronically.

## 2021-01-01 NOTE — Progress Notes (Signed)
Report given to PACU, vss 

## 2021-01-03 ENCOUNTER — Telehealth: Payer: Self-pay | Admitting: *Deleted

## 2021-01-03 NOTE — Telephone Encounter (Signed)
  Follow up Call-  Call back number 01/01/2021  Post procedure Call Back phone  # [phone]  Permission to leave phone message Yes  Some recent data might be hidden     Patient questions:  Do you have a fever, pain , or abdominal swelling? No. Pain Score  0 *  Have you tolerated food without any problems? Yes.    Have you been able to return to your normal activities? Yes.    Do you have any questions about your discharge instructions: Diet   No. Medications  No. Follow up visit  No.  Do you have questions or concerns about your Care? No.  Actions: * If pain score is 4 or above: No action needed, pain <4.  1. Have you developed a fever since your procedure? no  2.   Have you had an respiratory symptoms (SOB or cough) since your procedure? no  3.   Have you tested positive for COVID 19 since your procedure no  4.   Have you had any family members/close contacts diagnosed with the COVID 19 since your procedure?  no   If yes to any of these questions please route to Joylene John, RN and Joella Prince, RN

## 2021-01-03 NOTE — Telephone Encounter (Signed)
  Follow up Call-  Call back number 01/01/2021  Post procedure Call Back phone  # [phone]  Permission to leave phone message Yes  Some recent data might be hidden     Patient questions:  Message to call us if necessary.

## 2021-01-12 ENCOUNTER — Encounter: Payer: Self-pay | Admitting: Gastroenterology

## 2021-01-20 DIAGNOSIS — Z0100 Encounter for examination of eyes and vision without abnormal findings: Secondary | ICD-10-CM | POA: Diagnosis not present

## 2021-04-08 ENCOUNTER — Other Ambulatory Visit: Payer: Self-pay | Admitting: Nurse Practitioner

## 2021-04-08 DIAGNOSIS — E1169 Type 2 diabetes mellitus with other specified complication: Secondary | ICD-10-CM

## 2021-04-08 DIAGNOSIS — E785 Hyperlipidemia, unspecified: Secondary | ICD-10-CM

## 2021-04-08 DIAGNOSIS — I10 Essential (primary) hypertension: Secondary | ICD-10-CM

## 2021-04-10 ENCOUNTER — Other Ambulatory Visit: Payer: Self-pay | Admitting: Nurse Practitioner

## 2021-04-10 ENCOUNTER — Telehealth: Payer: Medicare HMO | Admitting: Nurse Practitioner

## 2021-04-10 DIAGNOSIS — E785 Hyperlipidemia, unspecified: Secondary | ICD-10-CM

## 2021-04-10 DIAGNOSIS — I10 Essential (primary) hypertension: Secondary | ICD-10-CM

## 2021-04-10 NOTE — Telephone Encounter (Signed)
Please call pt and remind her she is due for screening mammogram. Recommend she call and make appt.  She also missed last follow up with me. Please have her reschedule. Thank you

## 2021-04-10 NOTE — Telephone Encounter (Signed)
LMOM to return call.

## 2021-04-10 NOTE — Telephone Encounter (Signed)
Mychart message sent to patient with details of this message

## 2021-04-10 NOTE — Telephone Encounter (Signed)
30 day supply, needs follow up appt scheduled.

## 2021-04-14 ENCOUNTER — Other Ambulatory Visit: Payer: Self-pay | Admitting: Nurse Practitioner

## 2021-04-14 DIAGNOSIS — I10 Essential (primary) hypertension: Secondary | ICD-10-CM

## 2021-04-14 DIAGNOSIS — E1169 Type 2 diabetes mellitus with other specified complication: Secondary | ICD-10-CM

## 2021-04-17 ENCOUNTER — Other Ambulatory Visit: Payer: Self-pay

## 2021-04-17 DIAGNOSIS — I10 Essential (primary) hypertension: Secondary | ICD-10-CM

## 2021-04-17 DIAGNOSIS — E785 Hyperlipidemia, unspecified: Secondary | ICD-10-CM

## 2021-04-17 MED ORDER — LOSARTAN POTASSIUM 25 MG PO TABS: ORAL_TABLET | ORAL | 0 refills | Status: DC

## 2021-04-17 NOTE — Telephone Encounter (Signed)
Okay to refill 90 day supply but will want to have her in office in September for follow up if she is able. Maybe we can clarify why she is not able to schedule appt and we can work with her on this.

## 2021-04-17 NOTE — Telephone Encounter (Signed)
Mychart message sent to question why patient is not able to schedule

## 2021-04-17 NOTE — Telephone Encounter (Signed)
Last ov 12/13/20, patient was informed that she is due for an appointment at last refill and she stated she is not available to schedule an appointment  Refer to [facility] message sent to patient on 04/10/2021

## 2021-06-04 ENCOUNTER — Ambulatory Visit (INDEPENDENT_AMBULATORY_CARE_PROVIDER_SITE_OTHER): Payer: Medicare HMO | Admitting: Nurse Practitioner

## 2021-06-04 ENCOUNTER — Encounter: Payer: Self-pay | Admitting: Nurse Practitioner

## 2021-06-04 ENCOUNTER — Other Ambulatory Visit: Payer: Self-pay

## 2021-06-04 VITALS — BP 140/78 | HR 84 | Temp 97.1°F | Ht 65.0 in | Wt 126.6 lb

## 2021-06-04 DIAGNOSIS — E1169 Type 2 diabetes mellitus with other specified complication: Secondary | ICD-10-CM

## 2021-06-04 DIAGNOSIS — E785 Hyperlipidemia, unspecified: Secondary | ICD-10-CM | POA: Diagnosis not present

## 2021-06-04 DIAGNOSIS — E782 Mixed hyperlipidemia: Secondary | ICD-10-CM | POA: Diagnosis not present

## 2021-06-04 DIAGNOSIS — I10 Essential (primary) hypertension: Secondary | ICD-10-CM

## 2021-06-04 DIAGNOSIS — E2839 Other primary ovarian failure: Secondary | ICD-10-CM

## 2021-06-04 DIAGNOSIS — M8580 Other specified disorders of bone density and structure, unspecified site: Secondary | ICD-10-CM | POA: Diagnosis not present

## 2021-06-04 DIAGNOSIS — K5900 Constipation, unspecified: Secondary | ICD-10-CM | POA: Diagnosis not present

## 2021-06-04 NOTE — Progress Notes (Signed)
Careteam: Patient Care Team: [name], NP as PCP - General (Geriatric Medicine)  PLACE OF SERVICE:  [place] Directive information Does Patient Have a Medical Advance Directive?: No, Would patient like information on creating a medical advance directive?: No - Patient declined  No Known Allergies  Chief Complaint  Patient presents with   Medical Management of Chronic Issues    6 month follow-up. Patient is stressed about her living arrangements (rent going up and financial situation)       HPI: Patient is a 70 y.o. female for routine follow up  Htn- blood pressure in the 130s at home generally. On losartan.   She can not afford senior living places. These are 700-800$ a month. Affordable living places have a wait.  She has a dog that the affordable living places do not want to accommodate. She does not want to get rid of her dog. He is her companion and keeps her from being lonely which helps her mental health. And helps her stay active.  Increase anxiety due to situation and the dog being there with her helps. She is her family.   She does not want to have an further biopsy on breast, she will get mammogram but does not want further investigation on recurrent findings to right breast.   Stopped drinking milk, eating better.   Completed colonoscopy- 2 polyps but was told no more colonoscopy.   Review of Systems:  Review of Systems  Constitutional:  Negative for chills, fever and weight loss.  HENT:  Negative for tinnitus.   Respiratory:  Negative for cough, sputum production and shortness of breath.   Cardiovascular:  Negative for chest pain, palpitations and leg swelling.  Gastrointestinal:  Negative for abdominal pain, constipation, diarrhea and heartburn.  Genitourinary:  Negative for dysuria, frequency and urgency.  Musculoskeletal:  Negative for back pain, falls, joint pain and myalgias.  Skin: Negative.   Neurological:  Negative for  dizziness and headaches.  Psychiatric/Behavioral:  Negative for depression and memory loss. The patient does not have insomnia.    Past Medical History:  Diagnosis Date   Cervical radiculopathy    Fatigue    Generalized osteoarthrosis    HTN (hypertension)    Hyperlipidemia    Lateral epicondylitis of left elbow 06/28/2017   Left knee pain    Menopause    Neck pain    Pneumonia    Pre-diabetes    Sciatica    Swelling of left knee joint    Uterine cancer [facility])    Past Surgical History:  Procedure Laterality Date   ABCESS DRAINAGE Left 09/15/2017   behind tonsil   APPENDECTOMY     VAGINAL HYSTERECTOMY  1983   Social History:   reports that she has been smoking cigarettes. She has a 12.50 pack-year smoking history. She has never used smokeless tobacco. She reports that she does not drink alcohol and does not use drugs.  Family History  Problem Relation Age of Onset   Stroke Mother    Diabetes Mother    Dementia Mother    Alzheimer's disease Mother    Colon polyps Brother    Hypertension Brother    Hyperlipidemia Brother    Cancer Sister        unknown   Cancer Sister        unknown   Kidney failure Brother    Cancer Sister        unknown   Lung cancer Brother  Cancer Sister        unknown   Colon cancer Neg Hx    Esophageal cancer Neg Hx    Rectal cancer Neg Hx    Stomach cancer Neg Hx     Medications: Patient's Medications  New Prescriptions   No medications on file  Previous Medications   ACETAMINOPHEN (TYLENOL) 500 MG TABLET    Take 500 mg by mouth daily as needed.   ASPIRIN EC 81 MG TABLET    Take 1 tablet (81 mg total) by mouth daily. Swallow whole.   ATORVASTATIN (LIPITOR) 10 MG TABLET    TAKE 1 TABLET(10 MG) BY MOUTH DAILY   CALCIUM CITRATE-VITAMIN D (CALCIUM CITRATE + D PO)    Take 1 tablet by mouth daily.   DIPHENHYDRAMINE (BENADRYL) 25 MG TABLET    Take 25 mg by mouth as needed.    FLUTICASONE (FLONASE) 50 MCG/ACT NASAL SPRAY    Place 1 spray  into both nostrils as needed for allergies or rhinitis.   GLUCOSAMINE-CHONDROITIN 500-400 MG TABLET    Take 1 tablet by mouth daily.   LOSARTAN (COZAAR) 25 MG TABLET    TAKE 1 TABLET(25 MG) BY MOUTH DAILY   MISC NATURAL PRODUCTS (COLON CLEANSE) CAPS    Take by mouth. As needed   VITAMIN E 400 UNIT CAPSULE    Take 1,200 Units by mouth daily.   Modified Medications   No medications on file  Discontinued Medications   FLUTICASONE (FLONASE) 50 MCG/ACT NASAL SPRAY    SHAKE LIQUID AND USE 1 SPRAY IN EACH NOSTRIL TWICE DAILY    Physical Exam:  Vitals:   06/04/21 1015  BP: 140/78  Pulse: 84  Temp: (!) 97.1 F (36.2 C)  TempSrc: Temporal  SpO2: 97%  Weight: 126 lb 9.6 oz (57.4 kg)  Height: 5' 5"  (1.651 m)   Body mass index is 21.07 kg/m. Wt Readings from Last 3 Encounters:  06/04/21 126 lb 9.6 oz (57.4 kg)  01/01/21 133 lb (60.3 kg)  12/17/20 133 lb 2 oz (60.4 kg)    Physical Exam Constitutional:      General: She is not in acute distress.    Appearance: She is well-developed. She is not diaphoretic.  HENT:     Head: Normocephalic and atraumatic.     Mouth/Throat:     Pharynx: No oropharyngeal exudate.  Eyes:     Conjunctiva/sclera: Conjunctivae normal.     Pupils: Pupils are equal, round, and reactive to light.  Cardiovascular:     Rate and Rhythm: Normal rate and regular rhythm.     Heart sounds: Normal heart sounds.  Pulmonary:     Effort: Pulmonary effort is normal.     Breath sounds: Normal breath sounds.  Abdominal:     General: Bowel sounds are normal.     Palpations: Abdomen is soft.  Musculoskeletal:     Cervical back: Normal range of motion and neck supple.     Right lower leg: No edema.     Left lower leg: No edema.  Skin:    General: Skin is warm and dry.  Neurological:     Mental Status: She is alert and oriented to person, place, and time.  Psychiatric:        Mood and Affect: Mood normal.    Labs reviewed: Basic Metabolic Panel: Recent Labs     09/06/20 1223 11/01/20 0804 12/13/20 0000  NA 141 141 141  K 4.6 4.6 4.4  CL 104 104 106  CO2 26 29 27   GLUCOSE 86 89 92  BUN 13 13 12   CREATININE 0.76 0.75 0.75  CALCIUM 9.9 9.6 10.1   Liver Function Tests: Recent Labs    09/06/20 1223 11/01/20 0804 12/13/20 0000  AST 14 16 14   ALT 8 9 8   BILITOT 0.4 0.4 0.3  PROT 7.0 6.7 6.7   No results for input(s): LIPASE, AMYLASE in the last 8760 hours. No results for input(s): AMMONIA in the last 8760 hours. CBC: Recent Labs    09/06/20 1223 12/13/20 0000  WBC 5.7 5.9  NEUTROABS 2,132 2,224  HGB 12.4 12.6  HCT 39.4 38.6  MCV 85.5 84.6  PLT 277 263   Lipid Panel: Recent Labs    09/06/20 1223 11/01/20 0804  CHOL 324* 209*  HDL 50 63  LDLCALC 222* 125*  TRIG 309* 107  CHOLHDL 6.5* 3.3   TSH: No results for input(s): TSH in the last 8760 hours. A1C: Lab Results  Component Value Date   HGBA1C 6.7 (H) 09/06/2020     Assessment/Plan 1. Primary hypertension -Blood pressure elevated today, but typically well controlled -Patient reports bp typically elevated in office and home blood pressures are well controlled -No changes to medications today  -will have pt continue to monitor home bp goal <945/03 -follow metabolic panel - CBC with Differential/Platelet; Future - CMP with eGFR([facility]); Future  2. Mixed hyperlipidemia --continues on lipitor and will follow up lipids. Continue dietary modifications with medication management.  - CMP with eGFR([facility]); Future - Lipid panel; Future  3. Type 2 diabetes mellitus with hyperlipidemia ([place]) -she has made dietary changes. Encouraged dietary compliance, routine foot care/monitoring and to keep up with diabetic eye exams through ophthalmology  - Hemoglobin A1c; Future  4. Constipation, unspecified constipation type -well controlled at this time.  5. Osteopenia, unspecified location Recommended to take calcium 600 mg twice daily with Vitamin D 2000 units daily and  weight bearing activity 30 mins/5 days a week - DG Bone Density; Future  6. Estrogen deficiency Due for follow up bone density   Next appt: plans to come back for fasting labs. 6 month follow up. Sooner if needed [employer]. [place], [facility] [phone]

## 2021-06-04 NOTE — Patient Instructions (Addendum)
Schedule eye exam for diabetic exam please have them send [place] records Please sign record release   To schedule AWV (telephone visit)    Bring blood pressure readings in with you when you get labs done or send via mychart

## 2021-06-10 ENCOUNTER — Other Ambulatory Visit: Payer: Medicare HMO

## 2021-06-10 ENCOUNTER — Other Ambulatory Visit: Payer: Self-pay

## 2021-06-10 ENCOUNTER — Telehealth: Payer: Self-pay

## 2021-06-10 DIAGNOSIS — I10 Essential (primary) hypertension: Secondary | ICD-10-CM | POA: Diagnosis not present

## 2021-06-10 DIAGNOSIS — E785 Hyperlipidemia, unspecified: Secondary | ICD-10-CM | POA: Diagnosis not present

## 2021-06-10 DIAGNOSIS — E782 Mixed hyperlipidemia: Secondary | ICD-10-CM

## 2021-06-10 DIAGNOSIS — E1169 Type 2 diabetes mellitus with other specified complication: Secondary | ICD-10-CM | POA: Diagnosis not present

## 2021-06-10 NOTE — Telephone Encounter (Signed)
Patient was in office for lab work and dropped off her b/p readings and made a note questioning if she should start taking b/p medication early in the morning.  06/05/2021 (in the hour of 10 am): 139/79 06/05/2021 (in the hour of 6 am): 137/78 06/06/2021, 6 14 am 100/75 06/06/2021 6:16 am 133/75 06/07/2021 6:25 am 137/75 06/07/2021 5:15 pm  122/75 06/08/2021 6:00 am 150/82 06/08/2021 6:10 am 129/75 06/09/2021 6:45 (am or pm not indicated), 148/80 06/09/2021 6:46 pm (am or pm not indicated) 138/75 06/10/2021 7:05 123/71 06/10/2021 7:07 145/79, 125/68  Please advise

## 2021-06-10 NOTE — Telephone Encounter (Signed)
Overall blood pressure is controlled. She can take medication in the morning or in the evening whatever is better for her.

## 2021-06-10 NOTE — Telephone Encounter (Signed)
Patient aware of Emily Chandler, NP response and verbalized understanding

## 2021-06-11 LAB — COMPLETE METABOLIC PANEL WITH GFR
AG Ratio: 1.8 (calc) (ref 1.0–2.5)
ALT: 9 U/L (ref 6–29)
AST: 15 U/L (ref 10–35)
Albumin: 4.4 g/dL (ref 3.6–5.1)
Alkaline phosphatase (APISO): 60 U/L (ref 37–153)
BUN: 18 mg/dL (ref 7–25)
CO2: 27 mmol/L (ref 20–32)
Calcium: 9.7 mg/dL (ref 8.6–10.4)
Chloride: 105 mmol/L (ref 98–110)
Creat: 0.67 mg/dL (ref 0.60–1.00)
Globulin: 2.5 g/dL (calc) (ref 1.9–3.7)
Glucose, Bld: 101 mg/dL — ABNORMAL HIGH (ref 65–99)
Potassium: 4.4 mmol/L (ref 3.5–5.3)
Sodium: 140 mmol/L (ref 135–146)
Total Bilirubin: 0.4 mg/dL (ref 0.2–1.2)
Total Protein: 6.9 g/dL (ref 6.1–8.1)
eGFR: 93 mL/min/{1.73_m2} (ref >=60)

## 2021-06-11 LAB — LIPID PANEL
Cholesterol: 202 mg/dL — ABNORMAL HIGH (ref <200)
HDL: 58 mg/dL (ref >=50)
LDL Cholesterol (Calc): 122 mg/dL (calc) — ABNORMAL HIGH
Non-HDL Cholesterol (Calc): 144 mg/dL (calc) — ABNORMAL HIGH (ref <130)
Total CHOL/HDL Ratio: 3.5 (calc) (ref <5.0)
Triglycerides: 116 mg/dL (ref <150)

## 2021-06-11 LAB — CBC WITH DIFFERENTIAL/PLATELET
Absolute Monocytes: 448 cells/uL (ref 200–950)
Basophils Absolute: 59 cells/uL (ref 0–200)
Basophils Relative: 1.1 %
Eosinophils Absolute: 178 cells/uL (ref 15–500)
Eosinophils Relative: 3.3 %
HCT: 36.2 % (ref 35.0–45.0)
Hemoglobin: 11.2 g/dL — ABNORMAL LOW (ref 11.7–15.5)
Lymphs Abs: 2419 cells/uL (ref 850–3900)
MCH: 25.6 pg — ABNORMAL LOW (ref 27.0–33.0)
MCHC: 30.9 g/dL — ABNORMAL LOW (ref 32.0–36.0)
MCV: 82.8 fL (ref 80.0–100.0)
MPV: 11.9 fL (ref 7.5–12.5)
Monocytes Relative: 8.3 %
Neutro Abs: 2295 cells/uL (ref 1500–7800)
Neutrophils Relative %: 42.5 %
Platelets: 270 10*3/uL (ref 140–400)
RBC: 4.37 10*6/uL (ref 3.80–5.10)
RDW: 14.8 % (ref 11.0–15.0)
Total Lymphocyte: 44.8 %
WBC: 5.4 10*3/uL (ref 3.8–10.8)

## 2021-06-11 LAB — HEMOGLOBIN A1C
Hgb A1c MFr Bld: 6.3 % of total Hgb — ABNORMAL HIGH (ref <5.7)
Mean Plasma Glucose: 134 mg/dL
eAG (mmol/L): 7.4 mmol/L

## 2021-06-12 ENCOUNTER — Ambulatory Visit (INDEPENDENT_AMBULATORY_CARE_PROVIDER_SITE_OTHER): Payer: Medicare HMO | Admitting: Nurse Practitioner

## 2021-06-12 ENCOUNTER — Telehealth: Payer: Self-pay

## 2021-06-12 ENCOUNTER — Encounter: Payer: Self-pay | Admitting: Nurse Practitioner

## 2021-06-12 ENCOUNTER — Other Ambulatory Visit: Payer: Self-pay

## 2021-06-12 DIAGNOSIS — F17200 Nicotine dependence, unspecified, uncomplicated: Secondary | ICD-10-CM | POA: Diagnosis not present

## 2021-06-12 DIAGNOSIS — Z0000 Encounter for general adult medical examination without abnormal findings: Secondary | ICD-10-CM | POA: Diagnosis not present

## 2021-06-12 NOTE — Patient Instructions (Signed)
[name] , Thank you for taking time to come for your Medicare Wellness Visit. I appreciate your ongoing commitment to your health goals. Please review the following plan we discussed and let me know if I can assist you in the future.   Screening recommendations/referrals: Colonoscopy up to date Mammogram recommend to schedule [phone] Bone Density recommend to schedule [id number] Recommended yearly ophthalmology/optometry visit for glaucoma screening and checkup Recommended yearly dental visit for hygiene and checkup  Vaccinations: Influenza vaccine recommend to get annually (you have declined this)  Pneumococcal vaccine recommended (you have declined this) Tdap vaccine: Due -can get at local pharmacy  Shingles vaccine up to date    Advanced directives: recommended to completed at this time  Conditions/risks identified: advanced age.  Next appointment: 1 year    Preventive Care 76 Years and Older, Female Preventive care refers to lifestyle choices and visits with your health care provider that can promote health and wellness. What does preventive care include? A yearly physical exam. This is also called an annual well check. Dental exams once or twice a year. Routine eye exams. Ask your health care provider how often you should have your eyes checked. Personal lifestyle choices, including: Daily care of your teeth and gums. Regular physical activity. Eating a healthy diet. Avoiding tobacco and drug use. Limiting alcohol use. Practicing safe sex. Taking low-dose aspirin every day. Taking vitamin and mineral supplements as recommended by your health care provider. What happens during an annual well check? The services and screenings done by your health care provider during your annual well check will depend on your age, overall health, lifestyle risk factors, and family history of disease. Counseling  Your health care provider may ask you questions about  your: Alcohol use. Tobacco use. Drug use. Emotional well-being. Home and relationship well-being. Sexual activity. Eating habits. History of falls. Memory and ability to understand (cognition). Work and work Statistician. Reproductive health. Screening  You may have the following tests or measurements: Height, weight, and BMI. Blood pressure. Lipid and cholesterol levels. These may be checked every 5 years, or more frequently if you are over 50 years old. Skin check. Lung cancer screening. You may have this screening every year starting at age 64 if you have a 30-pack-year history of smoking and currently smoke or have quit within the past 15 years. Fecal occult blood test (FOBT) of the stool. You may have this test every year starting at age 59. Flexible sigmoidoscopy or colonoscopy. You may have a sigmoidoscopy every 5 years or a colonoscopy every 10 years starting at age 11. Hepatitis C blood test. Hepatitis B blood test. Sexually transmitted disease (STD) testing. Diabetes screening. This is done by checking your blood sugar (glucose) after you have not eaten for a while (fasting). You may have this done every 1-3 years. Bone density scan. This is done to screen for osteoporosis. You may have this done starting at age 41. Mammogram. This may be done every 1-2 years. Talk to your health care provider about how often you should have regular mammograms. Talk with your health care provider about your test results, treatment options, and if necessary, the need for more tests. Vaccines  Your health care provider may recommend certain vaccines, such as: Influenza vaccine. This is recommended every year. Tetanus, diphtheria, and acellular pertussis (Tdap, Td) vaccine. You may need a Td booster every 10 years. Zoster vaccine. You may need this after age 18. Pneumococcal 13-valent conjugate (PCV13) vaccine. One dose is recommended after  age 24. Pneumococcal polysaccharide (PPSV23) vaccine.  One dose is recommended after age 13. Talk to your health care provider about which screenings and vaccines you need and how often you need them. This information is not intended to replace advice given to you by your health care provider. Make sure you discuss any questions you have with your health care provider. Document Released: 11/01/2015 Document Revised: 06/24/2016 Document Reviewed: 08/06/2015 Elsevier Interactive Patient Education  2017 [employer] Prevention in the Home Falls can cause injuries. They can happen to people of all ages. There are many things you can do to make your home safe and to help prevent falls. What can I do on the outside of my home? Regularly fix the edges of walkways and driveways and fix any cracks. Remove anything that might make you trip as you walk through a door, such as a raised step or threshold. Trim any bushes or trees on the path to your home. Use bright outdoor lighting. Clear any walking paths of anything that might make someone trip, such as rocks or tools. Regularly check to see if handrails are loose or broken. Make sure that both sides of any steps have handrails. Any raised decks and porches should have guardrails on the edges. Have any leaves, snow, or ice cleared regularly. Use sand or salt on walking paths during winter. Clean up any spills in your garage right away. This includes oil or grease spills. What can I do in the bathroom? Use night lights. Install grab bars by the toilet and in the tub and shower. Do not use towel bars as grab bars. Use non-skid mats or decals in the tub or shower. If you need to sit down in the shower, use a plastic, non-slip stool. Keep the floor dry. Clean up any water that spills on the floor as soon as it happens. Remove soap buildup in the tub or shower regularly. Attach bath mats securely with double-sided non-slip rug tape. Do not have throw rugs and other things on the floor that can make  you trip. What can I do in the bedroom? Use night lights. Make sure that you have a light by your bed that is easy to reach. Do not use any sheets or blankets that are too big for your bed. They should not hang down onto the floor. Have a firm chair that has side arms. You can use this for support while you get dressed. Do not have throw rugs and other things on the floor that can make you trip. What can I do in the kitchen? Clean up any spills right away. Avoid walking on wet floors. Keep items that you use a lot in easy-to-reach places. If you need to reach something above you, use a strong step stool that has a grab bar. Keep electrical cords out of the way. Do not use floor polish or wax that makes floors slippery. If you must use wax, use non-skid floor wax. Do not have throw rugs and other things on the floor that can make you trip. What can I do with my stairs? Do not leave any items on the stairs. Make sure that there are handrails on both sides of the stairs and use them. Fix handrails that are broken or loose. Make sure that handrails are as long as the stairways. Check any carpeting to make sure that it is firmly attached to the stairs. Fix any carpet that is loose or worn. Avoid having throw rugs  at the top or bottom of the stairs. If you do have throw rugs, attach them to the floor with carpet tape. Make sure that you have a light switch at the top of the stairs and the bottom of the stairs. If you do not have them, ask someone to add them for you. What else can I do to help prevent falls? Wear shoes that: Do not have high heels. Have rubber bottoms. Are comfortable and fit you well. Are closed at the toe. Do not wear sandals. If you use a stepladder: Make sure that it is fully opened. Do not climb a closed stepladder. Make sure that both sides of the stepladder are locked into place. Ask someone to hold it for you, if possible. Clearly mark and make sure that you can  see: Any grab bars or handrails. First and last steps. Where the edge of each step is. Use tools that help you move around (mobility aids) if they are needed. These include: Canes. Walkers. Scooters. Crutches. Turn on the lights when you go into a dark area. Replace any light bulbs as soon as they burn out. Set up your furniture so you have a clear path. Avoid moving your furniture around. If any of your floors are uneven, fix them. If there are any pets around you, be aware of where they are. Review your medicines with your doctor. Some medicines can make you feel dizzy. This can increase your chance of falling. Ask your doctor what other things that you can do to help prevent falls. This information is not intended to replace advice given to you by your health care provider. Make sure you discuss any questions you have with your health care provider. Document Released: 08/01/2009 Document Revised: 03/12/2016 Document Reviewed: 11/09/2014 Elsevier Interactive Patient Education  2017 [employer].

## 2021-06-12 NOTE — Progress Notes (Signed)
Subjective:   Quinci Perret is a 70 y.o. female who presents for Medicare Annual (Subsequent) preventive examination.  Review of Systems     Cardiac Risk Factors include: advanced age (>63mn, >>84women);hypertension;dyslipidemia;diabetes mellitus     Objective:    There were no vitals filed for this visit. There is no height or weight on file to calculate BMI.  Advanced Directives 06/12/2021 06/04/2021 02/13/2020 02/10/2019 11/16/2018 11/14/2018 01/05/2018  Does Patient Have a Medical Advance Directive? Yes No No Yes No No No  Type of Advance Directive Living will - - Living will - - -  Would patient like information on creating a medical advance directive? - No - Patient declined - - No - Patient declined No - Patient declined Yes (MAU/Ambulatory/Procedural Areas - Information given)    Current Medications (verified) Outpatient Encounter Medications as of 06/12/2021  Medication Sig   acetaminophen (TYLENOL) 500 MG tablet Take 500 mg by mouth daily as needed.   aspirin EC 81 MG tablet Take 1 tablet (81 mg total) by mouth daily. Swallow whole.   atorvastatin (LIPITOR) 10 MG tablet TAKE 1 TABLET(10 MG) BY MOUTH DAILY   Calcium Citrate-Vitamin D (CALCIUM CITRATE + D PO) Take 1 tablet by mouth daily.   diphenhydrAMINE (BENADRYL) 25 MG tablet Take 25 mg by mouth as needed.    fluticasone (FLONASE) 50 MCG/ACT nasal spray Place 1 spray into both nostrils as needed for allergies or rhinitis.   glucosamine-chondroitin 500-400 MG tablet Take 1 tablet by mouth daily.   losartan (COZAAR) 25 MG tablet TAKE 1 TABLET(25 MG) BY MOUTH DAILY   Misc Natural Products (COLON CLEANSE) CAPS Take by mouth. As needed   vitamin E 400 UNIT capsule Take 1,200 Units by mouth daily.    No facility-administered encounter medications on file as of 06/12/2021.    Allergies (verified) Patient has no known allergies.   History: Past Medical History:  Diagnosis Date   Cervical radiculopathy    Fatigue     Generalized osteoarthrosis    HTN (hypertension)    Hyperlipidemia    Lateral epicondylitis of left elbow 06/28/2017   Left knee pain    Menopause    Neck pain    Pneumonia    Pre-diabetes    Sciatica    Swelling of left knee joint    [facility]terine [facility] ([facility]    Past Surgical History:  Procedure Laterality Date   ABCESS DRAINAGE Left 09/15/2017   behind tonsil   APPENDECTOMY     VAGINAL HYSTERECTOMY  1983   Family History  Problem Relation Age of Onset   Stroke Mother    Diabetes Mother    Dementia Mother    Alzheimer's disease Mother    Colon polyps Brother    Hypertension Brother    Hyperlipidemia Brother    Cancer Sister        unknown   Cancer Sister        unknown   Kidney failure Brother    Cancer Sister        unknown   Lung cancer Brother    Cancer Sister        unknown   Colon cancer Neg Hx    Esophageal cancer Neg Hx    Rectal cancer Neg Hx    Stomach cancer Neg Hx    Social History   Socioeconomic History   Marital status: Divorced    Spouse name: Not on file   Number of children: 2   Years of  education: Not on file   Highest education level: Not on file  Occupational History   Occupation: retired  Tobacco Use   Smoking status: Every Day    Packs/day: 0.25    Years: 50.00    Pack years: 12.50    Types: Cigarettes   Smokeless tobacco: Never  Vaping Use   Vaping Use: Never used  Substance and Sexual Activity   Alcohol use: No   Drug use: No   Sexual activity: Not on file  Other Topics Concern   Not on file  Social History Narrative   Social History      Diet? none      Do you drink/eat things with caffeine? Chocolate- occasionally      Marital status?        divorced                            What year were you married? 1982      Do you live in a house, apartment, assisted living, condo, trailer, etc.? apartment      Is it one or more stories? 1      How many persons live in your home? 1       Do you have any pets in your home?  (please list) yes, dog      Highest level of education completed? Associate degree- college      Current or past profession: retired      Do you exercise?           yes                           Type & how often? Walk- daily      Advanced Directives      Do you have a living will? yes      Do you have a DNR form?     yes                             If not, do you want to discuss one? yes      Do you have signed POA/HPOA for forms? no      Functional Status      Do you have difficulty bathing or dressing yourself?      Do you have difficulty preparing food or eating?       Do you have difficulty managing your medications?      Do you have difficulty managing your finances?      Do you have difficulty affording your medications?   Social Determinants of Health   Financial Resource Strain: Not on file  Food Insecurity: Not on file  Transportation Needs: Not on file  Physical Activity: Not on file  Stress: Not on file  Social Connections: Not on file    Tobacco Counseling Ready to quit: Not Answered Counseling given: Not Answered   Clinical Intake:  Pre-visit preparation completed: No  Pain : No/denies pain     BMI - recorded: 21 Nutritional Status: BMI of 19-24  Normal Nutritional Risks: None, Unintentional weight loss Diabetes: Yes  How often do you need to have someone help you when you read instructions, pamphlets, or other written materials from your doctor or pharmacy?: 1 - Never  Diabetic?yes         Activities of Daily Living In your present  state of health, do you have any difficulty performing the following activities: 06/12/2021  Hearing? N  Vision? N  Difficulty concentrating or making decisions? N  Walking or climbing stairs? N  Dressing or bathing? N  Doing errands, shopping? N  Preparing Food and eating ? N  Using the Toilet? N  In the past six months, have you accidently leaked urine? N  Do you have problems with loss of bowel  control? N  Managing your Medications? N  Managing your Finances? N  Housekeeping or managing your Housekeeping? N  Some recent data might be hidden    Patient Care Team: [name], NP as PCP - General (Geriatric Medicine)  Indicate any recent Medical Services you may have received from other than [facility] providers in the past year (date may be approximate).     Assessment:   This is a routine wellness examination for Harlan.  Hearing/Vision screen Hearing Screening - Comments:: No hearing issues  Vision Screening - Comments:: Last eye exam less than 12 months ago. Mauri Reading @ [facility]   Dietary issues and exercise activities discussed: Current Exercise Habits: Home exercise routine, Type of exercise: stretching;walking, Time (Minutes): 60, Frequency (Times/Week): 7, Weekly Exercise (Minutes/Week): 420   Goals Addressed   None    Depression Screen PHQ 2/9 Scores 06/12/2021 06/04/2021 09/06/2020 02/13/2020 02/10/2019 01/12/2018 01/05/2018  PHQ - 2 Score 0 0 0 0 0 0 0    Fall Risk Fall Risk  06/12/2021 06/04/2021 09/06/2020 02/13/2020 02/10/2019  Falls in the past year? 0 0 0 0 0  Number falls in past yr: 0 0 0 0 0  Injury with Fall? 0 0 0 0 0  Risk for fall due to : No Fall Risks No Fall Risks - - -  Follow up Falls evaluation completed Falls evaluation completed - - -    FALL RISK PREVENTION PERTAINING TO THE HOME:  Any stairs in or around the home? Yes  If so, are there any without handrails? Yes  Home free of loose throw rugs in walkways, pet beds, electrical cords, etc? Yes  Adequate lighting in your home to reduce risk of falls? Yes   ASSISTIVE DEVICES UTILIZED TO PREVENT FALLS:  Life alert? No  Use of a cane, walker or w/c? No  Grab bars in the bathroom? No  Shower chair or bench in shower? No  Elevated toilet seat or a handicapped toilet? No   TIMED UP AND GO:  Was the test performed? No .     Cognitive Function: MMSE - Mini Mental State Exam  01/05/2018  Orientation to time 5  Orientation to Place 5  Registration 3  Attention/ Calculation 5  Recall 3  Language- name 2 objects 2  Language- repeat 1  Language- follow 3 step command 3  Language- read & follow direction 1  Write a sentence 1  Copy design 1  Total score 30     6CIT Screen 06/12/2021 02/13/2020 02/10/2019  What Year? 0 points 0 points 0 points  What month? 0 points 0 points 0 points  What time? 0 points 0 points 0 points  Count back from 20 0 points 0 points 0 points  Months in reverse 0 points 0 points 0 points  Repeat phrase 0 points 0 points 2 points  Total Score 0 0 2    Immunizations Immunization History  Administered Date(s) Administered   Moderna SARS-COV2 Booster Vaccination 01/28/2021   Moderna Sars-Covid-2 Vaccination 12/01/2019, 12/29/2019, 08/17/2020  Tdap 02/05/2011   Zoster Recombinat (Shingrix) 03/19/2017, 07/19/2017    TDAP status: Due, Education has been provided regarding the importance of this vaccine. Advised may receive this vaccine at local pharmacy or Health Dept. Aware to provide a copy of the vaccination record if obtained from local pharmacy or Health Dept. Verbalized acceptance and understanding.  Flu Vaccine status: Due, Education has been provided regarding the importance of this vaccine. Advised may receive this vaccine at local pharmacy or Health Dept. Aware to provide a copy of the vaccination record if obtained from local pharmacy or Health Dept. Verbalized acceptance and understanding.  Pneumococcal vaccine status: Due, Education has been provided regarding the importance of this vaccine. Advised may receive this vaccine at local pharmacy or Health Dept. Aware to provide a copy of the vaccination record if obtained from local pharmacy or Health Dept. Verbalized acceptance and understanding.  Covid-19 vaccine status: Completed vaccines  Qualifies for Shingles Vaccine? Yes   Zostavax completed Yes   Shingrix Completed?:  Yes  Screening Tests Health Maintenance  Topic Date Due   OPHTHALMOLOGY EXAM  Never done   MAMMOGRAM  02/16/2020   TETANUS/TDAP  02/04/2021   INFLUENZA VACCINE  05/19/2021   PNA vac Low Risk Adult (1 of 2 - PCV13) 02/10/2079 (Originally 08/11/2014)   FOOT EXAM  09/06/2021   HEMOGLOBIN A1C  12/11/2021   COLONOSCOPY (Pts 45-10yr Insurance coverage will need to be confirmed)  01/02/2031   DEXA SCAN  Completed   COVID-19 Vaccine  Completed   Hepatitis C Screening  Completed   Zoster Vaccines- Shingrix  Completed   HPV VACCINES  Aged Out    Health Maintenance  Health Maintenance Due  Topic Date Due   OPHTHALMOLOGY EXAM  Never done   MAMMOGRAM  02/16/2020   TETANUS/TDAP  02/04/2021   INFLUENZA VACCINE  05/19/2021    Colorectal cancer screening: Type of screening: Colonoscopy. Completed 2022. Repeat every 10 years  Mammogram status: Ordered and encouraged to schedule. Pt provided with contact info and advised to call to schedule appt.   Bone Density status: Ordered and encouraged to scheduled. Pt provided with contact info and advised to call to schedule appt.  Lung Cancer Screening: (Low Dose CT Chest recommended if Age 574-80years, 30 pack-year currently smoking OR have quit w/in 15years.) does qualify.   Lung Cancer Screening Referral: she declines  Additional Screening:  Hepatitis C Screening: does not qualify; Completed   Vision Screening: Recommended annual ophthalmology exams for early detection of glaucoma and other disorders of the eye. Is the patient up to date with their annual eye exam?  Yes  Who is the provider or what is the name of the office in which the patient attends annual eye exams? [facility] If pt is not established with a provider, would they like to be referred to a provider to establish care? No .   Dental Screening: Recommended annual dental exams for proper oral hygiene  Community Resource Referral / Chronic Care Management: CRR required  this visit?  No   CCM required this visit?  No      Plan:     I have personally reviewed and noted the following in the patient's chart:   Medical and social history Use of alcohol, tobacco or illicit drugs  Current medications and supplements including opioid prescriptions.  Functional ability and status Nutritional status Physical activity Advanced directives List of other physicians Hospitalizations, surgeries, and ER visits in previous 12 months Vitals Screenings to  include cognitive, depression, and falls Referrals and appointments  In addition, I have reviewed and discussed with patient certain preventive protocols, quality metrics, and best practice recommendations. A written personalized care plan for preventive services as well as general preventive health recommendations were provided to patient.     [name], NP   06/12/2021    Virtual Visit via Telephone Note  I connected withNAME@ on 06/12/21 at  2:15 PM EDT by telephone and verified that I am speaking with the correct person using two identifiers.  Location: Patient: home Provider: [facility]   I discussed the limitations, risks, security and privacy concerns of performing an evaluation and management service by telephone and the availability of in person appointments. I also discussed with the patient that there may be a patient responsible charge related to this service. The patient expressed understanding and agreed to proceed.   I discussed the assessment and treatment plan with the patient. The patient was provided an opportunity to ask questions and all were answered. The patient agreed with the plan and demonstrated an understanding of the instructions.   The patient was advised to call back or seek an in-person evaluation if the symptoms worsen or if the condition fails to improve as anticipated.  I provided 18 minutes of non-face-to-face time during this encounter.  Carlos American. Harle Battiest Avs printed and mailed

## 2021-06-12 NOTE — Progress Notes (Signed)
   This service is provided via telemedicine  No vital signs collected/recorded due to the encounter was a telemedicine visit.   Location of patient (ex: home, work):  Home  Patient consents to a telephone visit: Yes, see telephone visit dated 06/12/21  Location of the provider (ex: office, home):  [facility], Office   Name of any referring provider:  N/A  Names of all persons participating in the telemedicine service and their role in the encounter:  S.Chrae B/CMA, [name], NP, and Patient   Time spent on call:  8 min with medical assistant

## 2021-06-12 NOTE — Telephone Encounter (Signed)
[name], Emily Hayes are scheduled for a virtual visit with your provider today.    Just as we do with appointments in the office, we must obtain your consent to participate.  Your consent will be active for this visit and any virtual visit you may have with one of our providers in the next 365 days.    If you have a MyChart account, I can also send a copy of this consent to you electronically.  All virtual visits are billed to your insurance company just like a traditional visit in the office.  As this is a virtual visit, video technology does not allow for your provider to perform a traditional examination.  This may limit your provider's ability to fully assess your condition.  If your provider identifies any concerns that need to be evaluated in person or the need to arrange testing such as labs, EKG, etc, we will make arrangements to do so.    Although advances in technology are sophisticated, we cannot ensure that it will always work on either your end or our end.  If the connection with a video visit is poor, we may have to switch to a telephone visit.  With either a video or telephone visit, we are not always able to ensure that we have a secure connection.   I need to obtain your verbal consent now.   Are you willing to proceed with your visit today?   Emily Hayes has provided verbal consent on 06/12/2021 for a virtual visit (video or telephone).   Leigh Aurora [place], [place] 06/12/2021  1:47 PM

## 2021-06-17 ENCOUNTER — Telehealth: Payer: Self-pay

## 2021-06-17 DIAGNOSIS — E782 Mixed hyperlipidemia: Secondary | ICD-10-CM

## 2021-06-17 MED ORDER — ATORVASTATIN CALCIUM 10 MG PO TABS: ORAL_TABLET | ORAL | 0 refills | Status: DC

## 2021-06-17 NOTE — Telephone Encounter (Signed)
Confirmed dose, patient states she is currently out and would like it sent to [facility].

## 2021-07-04 ENCOUNTER — Ambulatory Visit
Admission: RE | Admit: 2021-07-04 | Discharge: 2021-07-04 | Disposition: A | Discharge status: Home / Self Care | Payer: Medicare HMO | Source: Ambulatory Visit | Attending: Nurse Practitioner | Admitting: Nurse Practitioner

## 2021-07-04 ENCOUNTER — Other Ambulatory Visit: Payer: Self-pay

## 2021-07-04 DIAGNOSIS — R921 Mammographic calcification found on diagnostic imaging of breast: Secondary | ICD-10-CM

## 2021-07-04 DIAGNOSIS — R922 Inconclusive mammogram: Secondary | ICD-10-CM | POA: Diagnosis not present

## 2021-08-01 ENCOUNTER — Ambulatory Visit (INDEPENDENT_AMBULATORY_CARE_PROVIDER_SITE_OTHER): Payer: Medicare HMO | Admitting: Nurse Practitioner

## 2021-08-01 ENCOUNTER — Other Ambulatory Visit: Payer: Self-pay

## 2021-08-01 ENCOUNTER — Encounter: Payer: Self-pay | Admitting: Nurse Practitioner

## 2021-08-01 VITALS — BP 132/64 | HR 86 | Temp 97.6°F | Wt 124.4 lb

## 2021-08-01 DIAGNOSIS — K4090 Unilateral inguinal hernia, without obstruction or gangrene, not specified as recurrent: Secondary | ICD-10-CM

## 2021-08-01 NOTE — Patient Instructions (Signed)
Check with pharmacy on your Tdap vaccine

## 2021-08-01 NOTE — Progress Notes (Signed)
Careteam: Patient Care Team: Emily Chandler, NP as PCP - General (Geriatric Medicine)  PLACE OF SERVICE:  [place] Directive information Does Patient Have a Medical Advance Directive?: Yes, Type of Advance Directive: Living will, Does patient want to make changes to medical advance directive?: No - Patient declined  No Known Allergies  Chief Complaint  Patient presents with   Acute Visit    Patient c/o lump in groin area. Discuss if patient to be excluded from flu vaccine.      HPI: Patient is a 70 y.o. female due to lump in groin area. Just notice it. has not changed. No pain or discomfort. Area is soft and squishy.  Occasionally constipation  Reports she is stress free. Able to move into her new place with her animal. Blood pressure doing much better.   Review of Systems:  Review of Systems  Constitutional:  Negative for chills, fever and malaise/fatigue.  Gastrointestinal:  Negative for abdominal pain, constipation, diarrhea, heartburn and nausea.  Genitourinary:  Negative for flank pain and hematuria.   Past Medical History:  Diagnosis Date   Cervical radiculopathy    Fatigue    Generalized osteoarthrosis    HTN (hypertension)    Hyperlipidemia    Lateral epicondylitis of left elbow 06/28/2017   Left knee pain    Menopause    Neck pain    Pneumonia    Pre-diabetes    Sciatica    Swelling of left knee joint    Uterine cancer [facility])    Past Surgical History:  Procedure Laterality Date   ABCESS DRAINAGE Left 09/15/2017   behind tonsil   APPENDECTOMY     VAGINAL HYSTERECTOMY  1983   Social History:   reports that she has been smoking cigarettes. She has a 12.50 pack-year smoking history. She has never used smokeless tobacco. She reports that she does not drink alcohol and does not use drugs.  Family History  Problem Relation Age of Onset   Stroke Mother    Diabetes Mother    Dementia Mother    Alzheimer's disease Mother    Colon  polyps Brother    Hypertension Brother    Hyperlipidemia Brother    Cancer Sister        unknown   Cancer Sister        unknown   Kidney failure Brother    Cancer Sister        unknown   Lung cancer Brother    Cancer Sister        unknown   Colon cancer Neg Hx    Esophageal cancer Neg Hx    Rectal cancer Neg Hx    Stomach cancer Neg Hx     Medications: Patient's Medications  New Prescriptions   No medications on file  Previous Medications   ACETAMINOPHEN (TYLENOL) 500 MG TABLET    Take 500 mg by mouth daily as needed.   ASPIRIN EC 81 MG TABLET    Take 1 tablet (81 mg total) by mouth daily. Swallow whole.   ATORVASTATIN (LIPITOR) 10 MG TABLET    TAKE 1 TABLET(10 MG) BY MOUTH DAILY   CALCIUM CITRATE-VITAMIN D (CALCIUM CITRATE + D PO)    Take 1 tablet by mouth daily.   DIPHENHYDRAMINE (BENADRYL) 25 MG TABLET    Take 25 mg by mouth as needed.    FLUTICASONE (FLONASE) 50 MCG/ACT NASAL SPRAY    Place 1 spray into both nostrils as needed for allergies  or rhinitis.   GLUCOSAMINE-CHONDROITIN 500-400 MG TABLET    Take 1 tablet by mouth daily.   LOSARTAN (COZAAR) 25 MG TABLET    TAKE 1 TABLET(25 MG) BY MOUTH DAILY   MISC NATURAL PRODUCTS (COLON CLEANSE) CAPS    Take by mouth. As needed   VITAMIN E 400 UNIT CAPSULE    Take 1,200 Units by mouth daily.   Modified Medications   No medications on file  Discontinued Medications   No medications on file    Physical Exam:  Vitals:   08/01/21 1433  BP: 132/64  Pulse: 86  Temp: 97.6 F (36.4 C)  Weight: 124 lb 6.4 oz (56.4 kg)   Body mass index is 20.7 kg/m. Wt Readings from Last 3 Encounters:  08/01/21 124 lb 6.4 oz (56.4 kg)  06/04/21 126 lb 9.6 oz (57.4 kg)  01/01/21 133 lb (60.3 kg)    Physical Exam HENT:     Head: Normocephalic and atraumatic.  Abdominal:     General: Abdomen is flat. Bowel sounds are normal.     Palpations: Abdomen is soft.     Hernia: A hernia is present. Hernia is present in the right inguinal area.   Neurological:     Mental Status: She is alert.    Labs reviewed: Basic Metabolic Panel: Recent Labs    11/01/20 0804 12/13/20 0000 06/10/21 0812  NA 141 141 140  K 4.6 4.4 4.4  CL 104 106 105  CO2 29 27 27   GLUCOSE 89 92 101*  BUN 13 12 18   CREATININE 0.75 0.75 0.67  CALCIUM 9.6 10.1 9.7   Liver Function Tests: Recent Labs    11/01/20 0804 12/13/20 0000 06/10/21 0812  AST 16 14 15   ALT 9 8 9   BILITOT 0.4 0.3 0.4  PROT 6.7 6.7 6.9   No results for input(s): LIPASE, AMYLASE in the last 8760 hours. No results for input(s): AMMONIA in the last 8760 hours. CBC: Recent Labs    09/06/20 1223 12/13/20 0000 06/10/21 0812  WBC 5.7 5.9 5.4  NEUTROABS 2,132 2,224 2,295  HGB 12.4 12.6 11.2*  HCT 39.4 38.6 36.2  MCV 85.5 84.6 82.8  PLT 277 263 270   Lipid Panel: Recent Labs    09/06/20 1223 11/01/20 0804 06/10/21 0812  CHOL 324* 209* 202*  HDL 50 63 58  LDLCALC 222* 125* 122*  TRIG 309* 107 116  CHOLHDL 6.5* 3.3 3.5   TSH: No results for input(s): TSH in the last 8760 hours. A1C: Lab Results  Component Value Date   HGBA1C 6.3 (H) 06/10/2021     Assessment/Plan 1. Non-recurrent unilateral inguinal hernia without obstruction or gangrene -easily reducible, small, nontender. Education provided   2. Hypertension Blood pressure well controlled today  Emily Hayes, [facility] [phone]

## 2021-09-10 ENCOUNTER — Other Ambulatory Visit: Payer: Self-pay | Admitting: Nurse Practitioner

## 2021-09-10 DIAGNOSIS — E782 Mixed hyperlipidemia: Secondary | ICD-10-CM

## 2021-09-22 ENCOUNTER — Ambulatory Visit: Payer: Medicare HMO | Admitting: Nurse Practitioner

## 2021-10-30 ENCOUNTER — Other Ambulatory Visit: Payer: Self-pay | Admitting: Nurse Practitioner

## 2021-10-30 DIAGNOSIS — E1169 Type 2 diabetes mellitus with other specified complication: Secondary | ICD-10-CM

## 2021-10-30 DIAGNOSIS — I10 Essential (primary) hypertension: Secondary | ICD-10-CM

## 2021-11-26 ENCOUNTER — Ambulatory Visit
Admission: RE | Admit: 2021-11-26 | Discharge: 2021-11-26 | Disposition: A | Discharge status: Home / Self Care | Payer: Medicare HMO | Source: Ambulatory Visit | Attending: Nurse Practitioner | Admitting: Nurse Practitioner

## 2021-11-26 DIAGNOSIS — H43393 Other vitreous opacities, bilateral: Secondary | ICD-10-CM | POA: Diagnosis not present

## 2021-11-26 DIAGNOSIS — M8588 Other specified disorders of bone density and structure, other site: Secondary | ICD-10-CM | POA: Diagnosis not present

## 2021-11-26 DIAGNOSIS — E2839 Other primary ovarian failure: Secondary | ICD-10-CM

## 2021-11-26 DIAGNOSIS — Z780 Asymptomatic menopausal state: Secondary | ICD-10-CM | POA: Diagnosis not present

## 2021-11-26 DIAGNOSIS — M8580 Other specified disorders of bone density and structure, unspecified site: Secondary | ICD-10-CM

## 2021-11-28 DIAGNOSIS — Z0100 Encounter for examination of eyes and vision without abnormal findings: Secondary | ICD-10-CM | POA: Diagnosis not present

## 2021-12-08 ENCOUNTER — Encounter: Payer: Medicare HMO | Admitting: Nurse Practitioner

## 2021-12-08 ENCOUNTER — Other Ambulatory Visit: Payer: Self-pay

## 2021-12-09 NOTE — Progress Notes (Signed)
Pt did not stay to be seen

## 2022-01-29 IMAGING — MG DIGITAL DIAGNOSTIC BILAT W/ TOMO W/ CAD
6 of 10 series · 6 of 26 positions shown · non-contrast
Comparison: Previous exam(s).

CLINICAL DATA: Delayed follow-up for likely benign right breast
calcifications.

EXAM:
DIGITAL DIAGNOSTIC BILATERAL MAMMOGRAM WITH TOMOSYNTHESIS AND CAD
TECHNIQUE: Bilateral digital diagnostic mammography and breast tomosynthesis
was performed. The images were evaluated with computer-aided
detection.

[R ML]
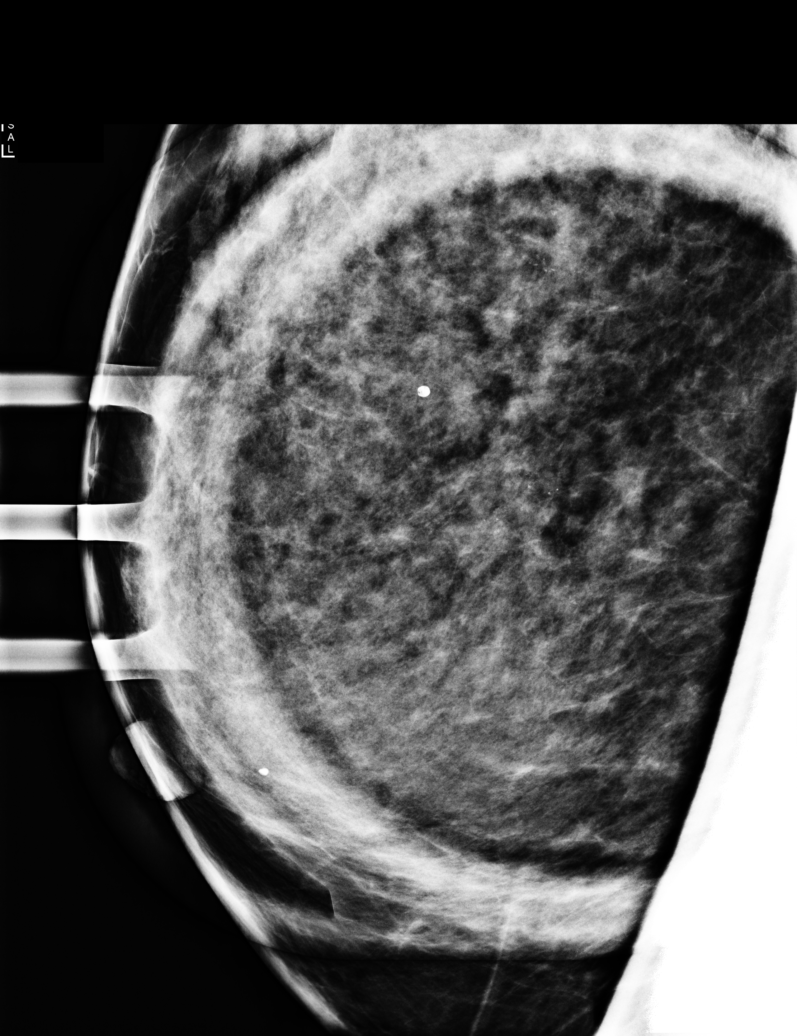

[R CC]
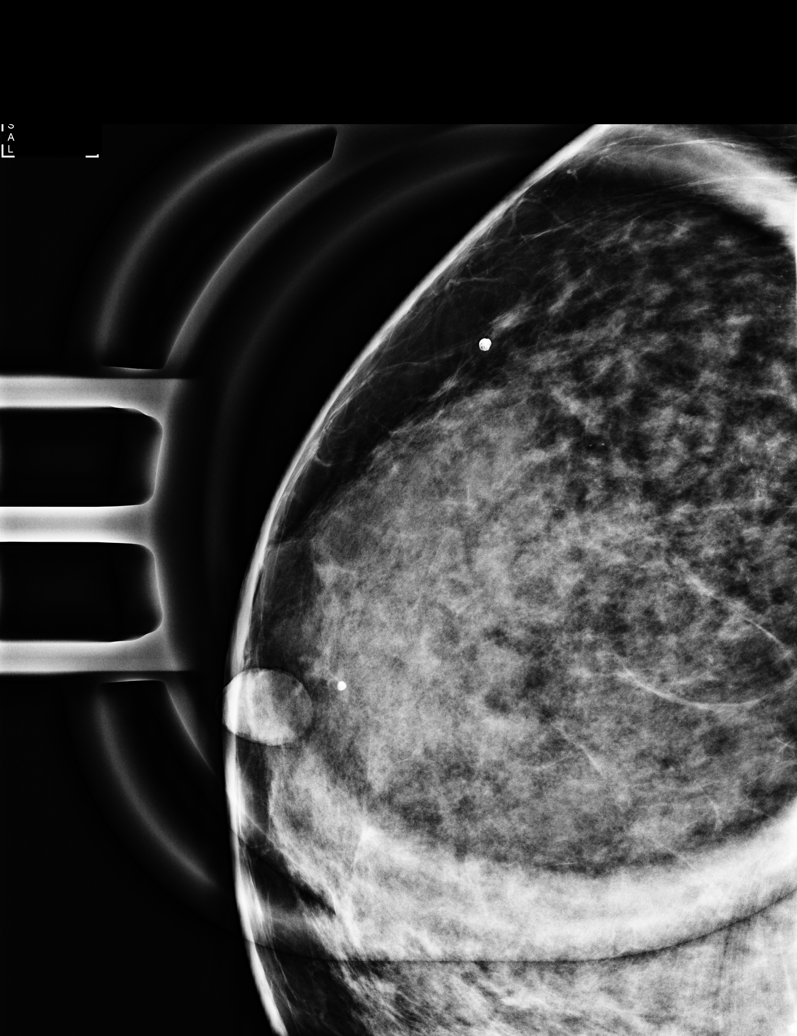

[R MLO synth-2D]
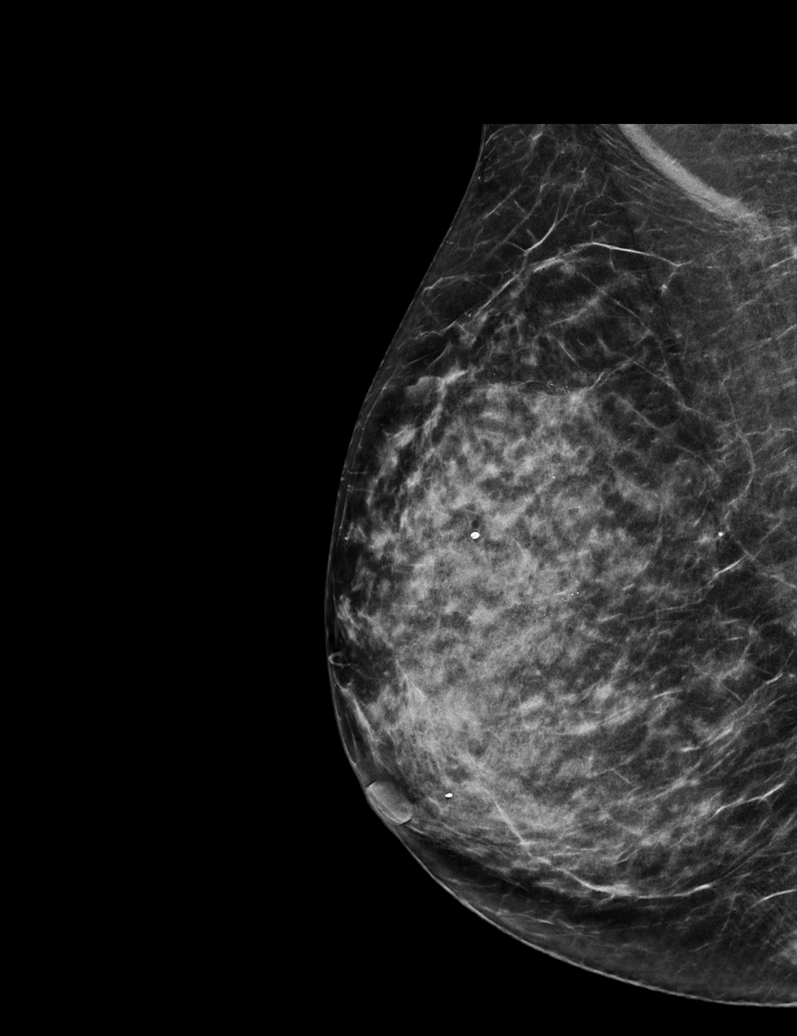

[L CC synth-2D]
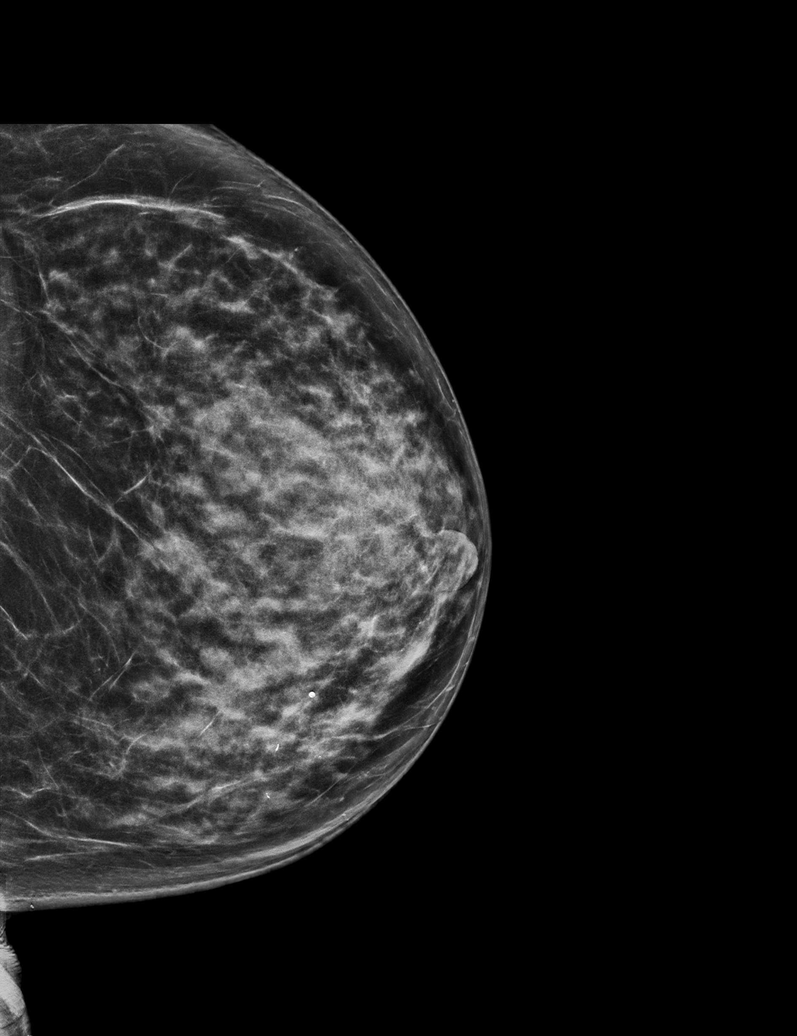

[L MLO synth-2D]
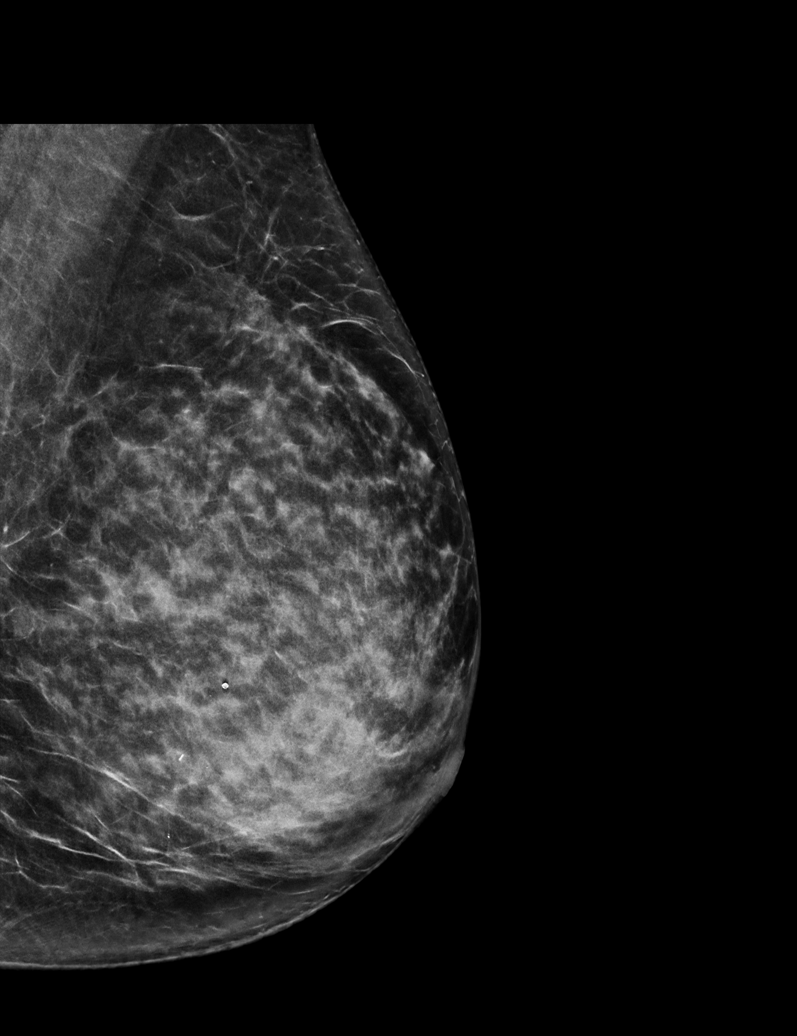

[R CC synth-2D]
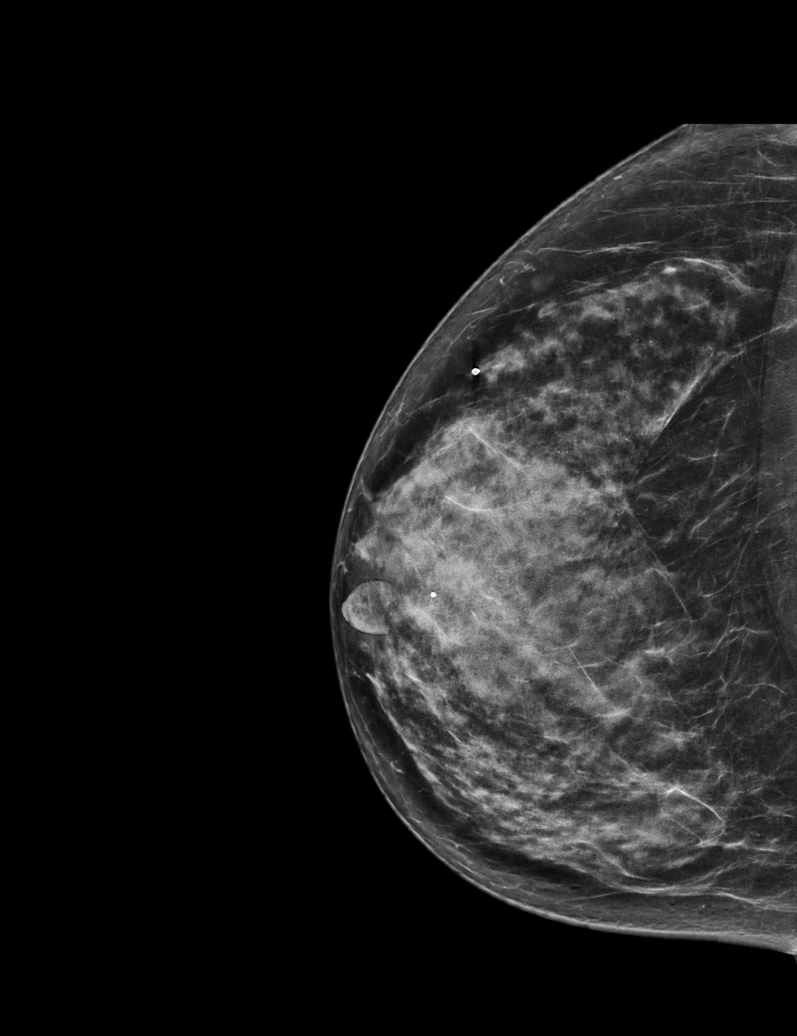

[6 of 26 positions shown; findings below may reference images not displayed]

ACR Breast Density Category d: The breast tissue is extremely dense,
which lowers the sensitivity of mammography.
FINDINGS: The calcifications in the upper-outer quadrant of the right breast
are mammographically stable. No suspicious calcifications, masses or
areas of distortion are seen in the bilateral breasts.
IMPRESSION: 1. The calcifications in the right breast have demonstrated greater
than 2 years of stability, and are therefore benign.

2.  No mammographic evidence of malignancy in the bilateral breasts.

RECOMMENDATION:
Screening mammogram in one year.(Code:3M-6-JNT)

I have discussed the findings and recommendations with the patient.
If applicable, a reminder letter will be sent to the patient
regarding the next appointment.

BI-RADS CATEGORY  2: Benign.

## 2022-03-16 ENCOUNTER — Other Ambulatory Visit: Payer: Self-pay | Admitting: Nurse Practitioner

## 2022-03-16 DIAGNOSIS — E782 Mixed hyperlipidemia: Secondary | ICD-10-CM

## 2022-05-01 ENCOUNTER — Other Ambulatory Visit: Payer: Self-pay | Admitting: Nurse Practitioner

## 2022-05-01 DIAGNOSIS — I10 Essential (primary) hypertension: Secondary | ICD-10-CM

## 2022-05-01 DIAGNOSIS — E1169 Type 2 diabetes mellitus with other specified complication: Secondary | ICD-10-CM

## 2022-05-05 ENCOUNTER — Telehealth (INDEPENDENT_AMBULATORY_CARE_PROVIDER_SITE_OTHER): Payer: Medicare HMO | Admitting: Family

## 2022-05-05 ENCOUNTER — Encounter: Payer: Self-pay | Admitting: Family

## 2022-05-05 DIAGNOSIS — M79604 Pain in right leg: Secondary | ICD-10-CM

## 2022-05-05 DIAGNOSIS — M545 Low back pain, unspecified: Secondary | ICD-10-CM | POA: Diagnosis not present

## 2022-05-05 MED ORDER — PREDNISONE 20 MG PO TABS: ORAL_TABLET | ORAL | 0 refills | Status: AC

## 2022-05-05 NOTE — Progress Notes (Signed)
This service is provided via telemedicine  No vital signs collected/recorded due to the encounter was a telemedicine visit.   Location of patient (ex: home, work):  Home  Patient consents to a telephone visit:  Yes  Location of the provider (ex: office, home):  [employer].  Name of any referring provider:  [name], NP   Names of all persons participating in the telemedicine service and their role in the encounter:  Patient, [name], [place], [name], [name], NP.    Time spent on call:  8 minutes spent on the phone with Medical Assistant.     Provider: Curley Fayette FNP-C  [name], NP  Patient Care Team: [name], NP as PCP - General (Geriatric Medicine)  Extended Emergency Contact Information Primary Emergency Contact: Spike,[name] Address: [address]          [address]          [place] [address] Johnnette Litter of [place] Phone: [phone] Relation: Daughter  Code Status:  Full Code  Goals of care: Advanced Directive information    05/05/2022    2:39 PM  Advanced Directives  Does Patient Have a Medical Advance Directive? Yes  Type of Paramedic of [place];Living will;Out of facility DNR (pink MOST or yellow form)  Does patient want to make changes to medical advance directive? No - Patient declined  Copy of [place] in Chart? No - copy requested     Chief Complaint  Patient presents with   Acute Visit    Patient complains of possible Sciatica. Patient states her back is hurting and she's unable to walk well.     HPI:  Pt is a 71 y.o. female seen today for an acute visit for evaluation of back pain with radiatio to leg x 3 days.states unable to do video visit would prefer telephone visit.  States unable to walk well. States lifted a load in a wrong way might have pulled a muscle on the back.pain is across the lower back and goes down to right leg.Pain  worst with trying to walk and sitting.  Denies any numbness or tingling on legs.Has had similar pain in the past was treated for sciatic nerve with improvement.   Past Medical History:  Diagnosis Date   Cervical radiculopathy    Fatigue    Generalized osteoarthrosis    HTN (hypertension)    Hyperlipidemia    Lateral epicondylitis of left elbow 06/28/2017   Left knee pain    Menopause    Neck pain    Pneumonia    Pre-diabetes    Sciatica    Swelling of left knee joint    Uterine cancer [facility])    Past Surgical History:  Procedure Laterality Date   ABCESS DRAINAGE Left 09/15/2017   behind tonsil   APPENDECTOMY     VAGINAL HYSTERECTOMY  1983    No Known Allergies  Outpatient Encounter Medications as of 05/05/2022  Medication Sig   acetaminophen (TYLENOL) 500 MG tablet Take 500 mg by mouth daily as needed.   aspirin EC 81 MG tablet Take 1 tablet (81 mg total) by mouth daily. Swallow whole.   atorvastatin (LIPITOR) 10 MG tablet TAKE 1 TABLET(10 MG) BY MOUTH DAILY   Calcium Citrate-Vitamin D (CALCIUM CITRATE + D PO) Take 1 tablet by mouth daily.   diphenhydrAMINE (BENADRYL) 25 MG tablet Take 25 mg by mouth as needed.    fluticasone (FLONASE) 50  MCG/ACT nasal spray Place 1 spray into both nostrils as needed for allergies or rhinitis.   losartan (COZAAR) 25 MG tablet TAKE 1 TABLET(25 MG) BY MOUTH DAILY   Misc Natural Products (COLON CLEANSE) CAPS Take by mouth. As needed   vitamin E 400 UNIT capsule Take 1,200 Units by mouth daily.    [DISCONTINUED] glucosamine-chondroitin 500-400 MG tablet Take 1 tablet by mouth daily.   No facility-administered encounter medications on file as of 05/05/2022.    Review of Systems  Constitutional:  Negative for chills, fatigue and fever.  Cardiovascular:  Negative for chest pain, palpitations and leg swelling.  Musculoskeletal:  Positive for arthralgias and back pain. Negative for gait problem and joint swelling.       Lower back pai with  radiation to right leg   Neurological:  Negative for weakness and numbness.    Immunization History  Administered Date(s) Administered   Medtronic Booster Vaccination 01/28/2021   Moderna Sars-Covid-2 Vaccination 12/01/2019, 12/29/2019, 08/17/2020   Tdap 02/05/2011   Zoster Recombinat (Shingrix) 03/19/2017, 07/19/2017   Pertinent  Health Maintenance Due  Topic Date Due   OPHTHALMOLOGY EXAM  Never done   FOOT EXAM  09/06/2021   HEMOGLOBIN A1C  12/11/2021   MAMMOGRAM  07/05/2023   COLONOSCOPY (Pts 45-9yr Insurance coverage will need to be confirmed)  01/02/2031   DEXA SCAN  Completed   INFLUENZA VACCINE  Discontinued      09/06/2020   11:44 AM 06/04/2021   10:18 AM 06/12/2021    1:49 PM 08/01/2021   11:41 AM 05/05/2022    2:39 PM  Fall Risk  Falls in the past year? 0 0 0 0 0  Was there an injury with Fall? 0 0 0 0 0  Fall Risk Category Calculator 0 0 0 0 0  Fall Risk Category Low Low Low Low Low  Patient Fall Risk Level Low fall risk Low fall risk Low fall risk Low fall risk Low fall risk  Patient at Risk for Falls Due to  No Fall Risks No Fall Risks No Fall Risks No Fall Risks  Fall risk Follow up  Falls evaluation completed Falls evaluation completed Falls evaluation completed Falls evaluation completed   Functional Status Survey:    There were no vitals filed for this visit. There is no height or weight on file to calculate BMI. Physical Exam  Unable to complete on telephone visit.declined video visit.   Labs reviewed: Recent Labs    06/10/21 0812  NA 140  K 4.4  CL 105  CO2 27  GLUCOSE 101*  BUN 18  CREATININE 0.67  CALCIUM 9.7   Recent Labs    06/10/21 0812  AST 15  ALT 9  BILITOT 0.4  PROT 6.9   Recent Labs    06/10/21 0812  WBC 5.4  NEUTROABS 2,295  HGB 11.2*  HCT 36.2  MCV 82.8  PLT 270   Lab Results  Component Value Date   TSH 1.34 10/14/2016   Lab Results  Component Value Date   HGBA1C 6.3 (H) 06/10/2021   Lab Results   Component Value Date   CHOL 202 (H) 06/10/2021   HDL 58 06/10/2021   LDLCALC 122 (H) 06/10/2021   TRIG 116 06/10/2021   CHOLHDL 3.5 06/10/2021    Significant Diagnostic Results in last 30 days:  No results found.  Assessment/Plan   Lumbar pain with radiation down right leg Reports bilateral lower back after lifting a load in a wrong way.Pain radiates  down to right leg unable to walk well and hurts to sit.suspect right sciatic Nerve pain.Has had similar pain in the past.Start on tapered prednisone as below.  - predniSONE (DELTASONE) 20 MG tablet; Take 2 tablets (40 mg total) by mouth daily with breakfast for 1 day, THEN 1.5 tablets (30 mg total) daily with breakfast for 1 day, THEN 1 tablet (20 mg total) daily with breakfast for 1 day, THEN 0.5 tablets (10 mg total) daily with breakfast for 1 day.  Dispense: 5 tablet; Refill: 0  Family/ staff Communication: Reviewed plan of care with patient verbalized understanding.   Labs/tests ordered: None   Next Appointment: Return if symptoms worsen or fail to improve.  I connected with  Emily Hayes on 05/05/22 by a Telephone enabled telemedicine application and verified that I am speaking with the correct person using two identifiers.   I discussed the limitations of evaluation and management by telemedicine. The patient expressed understanding and agreed to proceed.   Spent 11 minutes of non-face to face with patient  >50% time spent counseling; reviewing medical record; labs; and developing future plan of care.     Sandrea Hughs, NP

## 2022-05-05 NOTE — Patient Instructions (Signed)
-   Notify provider's office if symptoms worsen or fail to improve

## 2022-05-15 ENCOUNTER — Encounter (HOSPITAL_COMMUNITY): Payer: Self-pay | Admitting: Emergency Medicine

## 2022-05-15 ENCOUNTER — Emergency Department (HOSPITAL_COMMUNITY)
Admission: EM | Admit: 2022-05-15 | Discharge: 2022-05-15 | Disposition: A | Discharge status: Home / Self Care | Payer: Medicare HMO | Attending: Emergency Medicine | Admitting: Emergency Medicine

## 2022-05-15 ENCOUNTER — Emergency Department (HOSPITAL_COMMUNITY): Payer: Medicare HMO

## 2022-05-15 ENCOUNTER — Other Ambulatory Visit: Payer: Self-pay

## 2022-05-15 DIAGNOSIS — M5416 Radiculopathy, lumbar region: Secondary | ICD-10-CM | POA: Diagnosis not present

## 2022-05-15 DIAGNOSIS — M5410 Radiculopathy, site unspecified: Secondary | ICD-10-CM | POA: Insufficient documentation

## 2022-05-15 DIAGNOSIS — M5441 Lumbago with sciatica, right side: Secondary | ICD-10-CM | POA: Diagnosis not present

## 2022-05-15 DIAGNOSIS — Z7982 Long term (current) use of aspirin: Secondary | ICD-10-CM | POA: Insufficient documentation

## 2022-05-15 DIAGNOSIS — M545 Low back pain, unspecified: Secondary | ICD-10-CM | POA: Diagnosis not present

## 2022-05-15 MED ORDER — METHYLPREDNISOLONE 4 MG PO TBPK: ORAL_TABLET | ORAL | 0 refills | Status: DC

## 2022-05-15 MED ORDER — LIDOCAINE 5 % EX PTCH
1.0000 | MEDICATED_PATCH | CUTANEOUS | Status: DC
  Administered 2022-05-15: 1 via TRANSDERMAL
  Filled 2022-05-15: qty 1

## 2022-05-15 MED ORDER — HYDROCODONE-ACETAMINOPHEN 5-325 MG PO TABS: 1.0000 | ORAL_TABLET | ORAL | 0 refills | Status: DC | PRN

## 2022-05-15 MED ORDER — KETOROLAC TROMETHAMINE 30 MG/ML IJ SOLN
30.0000 mg | Freq: Once | INTRAMUSCULAR | Status: AC
  Administered 2022-05-15: 30 mg via INTRAMUSCULAR
  Filled 2022-05-15: qty 1

## 2022-05-15 MED ORDER — CYCLOBENZAPRINE HCL 10 MG PO TABS: 10.0000 mg | ORAL_TABLET | Freq: Two times a day (BID) | ORAL | 0 refills | Status: DC | PRN

## 2022-05-15 MED ORDER — LIDOCAINE 5 % EX PTCH: 1.0000 | MEDICATED_PATCH | CUTANEOUS | 0 refills | Status: DC

## 2022-05-15 MED ORDER — HYDROCODONE-ACETAMINOPHEN 5-325 MG PO TABS
1.0000 | ORAL_TABLET | Freq: Once | ORAL | Status: AC
  Administered 2022-05-15: 1 via ORAL
  Filled 2022-05-15: qty 1

## 2022-05-15 NOTE — Discharge Instructions (Signed)
Your history, exam, work-up today are consistent with nerve pain going from her back down her right leg.  I suspect is due to lifting the dog she described when it began.  Your x-ray did not show evidence of acute fracture but did show some arthritis as we discussed.  Given your otherwise reassuring exam, we agreed to hold on more advanced imaging today but do think you need to follow-up with outpatient neurosurgery.  Please use the combination of the patches, pain medicine, muscle relaxant, and the steroid taper to help with the symptoms and follow-up.  Please be careful not to have a fall.  If any symptoms change or worsen acutely, please return to the nearest emergency department.

## 2022-05-15 NOTE — ED Provider Notes (Signed)
[facility] DEPT Provider Note   CSN: [id number] Arrival date & time: 05/15/22  1046     History  No chief complaint on file.   SCOTTIE STANISH is a 71 y.o. female.  The history is provided by the patient and medical records. No language interpreter was used.  Back Pain Location:  Sacro-iliac joint and lumbar spine Quality:  Aching and shooting Radiates to:  R posterior upper leg Pain severity:  Severe Pain is:  Unable to specify Onset quality:  Sudden Duration:  2 weeks Timing:  Constant Progression:  Unchanged Chronicity:  Recurrent Relieved by:  Nothing Worsened by:  Touching and twisting Ineffective treatments:  None tried Associated symptoms: no abdominal pain, no bladder incontinence, no bowel incontinence, no chest pain, no dysuria, no fever, no headaches, no numbness, no paresthesias, no pelvic pain, no tingling and no weakness        Home Medications Prior to Admission medications   Medication Sig Start Date End Date Taking? Authorizing Provider  acetaminophen (TYLENOL) 500 MG tablet Take 500 mg by mouth daily as needed.    [provider]  aspirin EC 81 MG tablet Take 1 tablet (81 mg total) by mouth daily. Swallow whole. 09/20/20   [name], NP  atorvastatin (LIPITOR) 10 MG tablet TAKE 1 TABLET(10 MG) BY MOUTH DAILY 03/17/22   [name], NP  Calcium Citrate-Vitamin D (CALCIUM CITRATE + D PO) Take 1 tablet by mouth daily.    [provider]  diphenhydrAMINE (BENADRYL) 25 MG tablet Take 25 mg by mouth as needed.     [provider]  fluticasone (FLONASE) 50 MCG/ACT nasal spray Place 1 spray into both nostrils as needed for allergies or rhinitis.    [provider]  losartan (COZAAR) 25 MG tablet TAKE 1 TABLET(25 MG) BY MOUTH DAILY 05/01/22   [name], NP  Misc Natural Products (COLON CLEANSE) CAPS Take by mouth. As needed    [provider]  vitamin E 400 UNIT  capsule Take 1,200 Units by mouth daily.     [provider]      Allergies    Patient has no known allergies.    Review of Systems   Review of Systems  Constitutional:  Negative for chills, fatigue and fever.  HENT:  Negative for congestion.   Respiratory:  Negative for cough, chest tightness, shortness of breath and wheezing.   Cardiovascular:  Negative for chest pain.  Gastrointestinal:  Negative for abdominal pain, bowel incontinence, constipation, diarrhea, nausea and vomiting.  Genitourinary:  Negative for bladder incontinence, dysuria, flank pain, frequency and pelvic pain.  Musculoskeletal:  Positive for back pain. Negative for neck pain and neck stiffness.  Skin:  Negative for rash and wound.  Neurological:  Negative for tingling, weakness, light-headedness, numbness, headaches and paresthesias.  Psychiatric/Behavioral:  Negative for agitation.   All other systems reviewed and are negative.   Physical Exam Updated Vital Signs BP (!) 187/100 (BP Location: Left Arm)   Temp 98.9 F (37.2 C) (Oral)   Resp 18   Ht '5\' 8"'$  (1.727 m)   Wt 64.4 kg   SpO2 100%   BMI 21.59 kg/m  Physical Exam Vitals and nursing note reviewed.  Constitutional:      General: She is not in acute distress.    Appearance: She is well-developed. She is not ill-appearing, toxic-appearing or diaphoretic.  HENT:     Head: Normocephalic and atraumatic.     Nose:  Nose normal.     Mouth/Throat:     Mouth: Mucous membranes are moist.  Eyes:     Extraocular Movements: Extraocular movements intact.     Conjunctiva/sclera: Conjunctivae normal.     Pupils: Pupils are equal, round, and reactive to light.  Cardiovascular:     Rate and Rhythm: Normal rate and regular rhythm.     Heart sounds: No murmur heard. Pulmonary:     Effort: Pulmonary effort is normal. No respiratory distress.     Breath sounds: Normal breath sounds. No wheezing, rhonchi or rales.  Chest:     Chest wall: No tenderness.   Abdominal:     General: Abdomen is flat.     Palpations: Abdomen is soft.     Tenderness: There is no abdominal tenderness. There is no right CVA tenderness, left CVA tenderness, guarding or rebound.  Musculoskeletal:        General: Tenderness present. No swelling.     Cervical back: Neck supple.     Lumbar back: Spasms and tenderness present. No signs of trauma or lacerations.     Right lower leg: No edema.     Left lower leg: No edema.  Skin:    General: Skin is warm and dry.     Capillary Refill: Capillary refill takes less than 2 seconds.     Findings: No erythema or rash.  Neurological:     General: No focal deficit present.     Mental Status: She is alert.     Sensory: No sensory deficit.     Motor: No weakness.  Psychiatric:        Mood and Affect: Mood normal.     ED Results / Procedures / Treatments   Labs (all labs ordered are listed, but only abnormal results are displayed) Labs Reviewed - No data to display  EKG None  Radiology DG Lumbar Spine Complete  Result Date: 05/15/2022 CLINICAL DATA:  Low back pain with right sciatica after lifting heavy object. EXAM: LUMBAR SPINE - COMPLETE 4+ VIEW COMPARISON:  11/07/2018 FINDINGS: There is no evidence of lumbar spine fracture. Alignment is normal. Intervertebral disc spaces are maintained. Mild bilateral facet DJD is noted at L5-S1. IMPRESSION: No acute findings. Mild bilateral facet DJD at L5-S1. Electronically Signed   By: Marlaine Hind M.D.   On: 05/15/2022 12:35    Procedures Procedures    Medications Ordered in ED Medications  lidocaine (LIDODERM) 5 % 1 patch (1 patch Transdermal Patch Applied 05/15/22 1152)  HYDROcodone-acetaminophen (NORCO/VICODIN) 5-325 MG per tablet 1 tablet (1 tablet Oral Given 05/15/22 1152)  ketorolac (TORADOL) 30 MG/ML injection 30 mg (30 mg Intramuscular Given 05/15/22 1304)    ED Course/ Medical Decision Making/ A&P                           Medical Decision Making Amount and/or  Complexity of Data Reviewed Radiology: ordered.  Risk Prescription drug management.    ORISSA ARREAGA is a 71 y.o. female with a past medical history significant for hyperlipidemia, hypertension, osteopenia, osteoarthritis and history of previous uterine cancer who presents with severe back pain.  Patient reports that 2 weeks ago, she was picking up her dog for a bath when she had onset of pain in her low back.  She reports she has had similar problem with sciatic pain 2 years ago but it resolved.  She reports he had no difficulty and was feeling at her baseline  before this happened.  She reports that since then for the last 2-week she is having severe pain across her lower back but worse in the right low back going down her right leg to her calf.  She reports no numbness, tingling, or weakness and is only limited with ambulation due to the pain.  She denies loss of bowel or bladder control.  Denies any history of other trauma.  Denies any abdominal or chest pain.  Denies neck pain or upper back pain.  Denies other complaints.  On exam, lungs clear and chest nontender.  Abdomen nontender.  Patient is standing in the room as she does not want to sit down and bend her back.  Abdomen nontender.  Flanks nontender.  Patient has tenderness across her low back primarily in the mid low back and her right lower back.  Intact sensation, strength, and pulses.  Exam otherwise unremarkable.  Had a shared decision made conversation.  We will give her a Lidoderm patch and a pain pill initially as she reports the Norco helped last time.  We will get an x-ray to look for significant abnormality, if there is abnormalities that appear new, will consider CT imaging.  If imaging does not show acute abnormality, anticipate discharge with Medrol Dosepak, pain medicine, muscle relaxant, and patches and she will follow-up with a back doctor.   X-ray showed degenerative changes but no acute fracture.  I discussed the  findings and patient started feel better with medications.  She would like a Toradol shot which we will give her and she will be discharged home.  Patient agrees with plan of care and was discharged in good condition with improving symptoms.         Final Clinical Impression(s) / ED Diagnoses Final diagnoses:  Acute right-sided low back pain with right-sided sciatica  Radicular pain    Rx / DC Orders ED Discharge Orders          Ordered    HYDROcodone-acetaminophen (NORCO/VICODIN) 5-325 MG tablet  Every 4 hours PRN        05/15/22 1303    methylPREDNISolone (MEDROL DOSEPAK) 4 MG TBPK tablet        05/15/22 1303    lidocaine (LIDODERM) 5 %  Every 24 hours        05/15/22 1303    cyclobenzaprine (FLEXERIL) 10 MG tablet  2 times daily PRN        05/15/22 1303           Clinical Impression: 1. Acute right-sided low back pain with right-sided sciatica   2. Radicular pain     Disposition: Discharge  Condition: Good  I have discussed the results, Dx and Tx plan with the pt(& family if present). He/she/they expressed understanding and agree(s) with the plan. Discharge instructions discussed at great length. Strict return precautions discussed and pt &/or family have verbalized understanding of the instructions. No further questions at time of discharge.    New Prescriptions   CYCLOBENZAPRINE (FLEXERIL) 10 MG TABLET    Take 1 tablet (10 mg total) by mouth 2 (two) times daily as needed for muscle spasms.   HYDROCODONE-ACETAMINOPHEN (NORCO/VICODIN) 5-325 MG TABLET    Take 1 tablet by mouth every 4 (four) hours as needed.   LIDOCAINE (LIDODERM) 5 %    Place 1 patch onto the skin daily. Remove & Discard patch within 12 hours or as directed by MD   METHYLPREDNISOLONE (MEDROL DOSEPAK) 4 MG TBPK TABLET    Follow  instructions on pack    Follow Up: [place], [place] [address] [place] [address] [facility] DEPT [place] [id number] mc [place]       Reni Hausner, Gwenyth Allegra, MD 05/15/22 1308

## 2022-05-15 NOTE — ED Triage Notes (Signed)
Pt reports sciatica pain. Pain 9/10

## 2022-06-15 ENCOUNTER — Encounter: Payer: Medicare HMO | Admitting: Nurse Practitioner

## 2022-06-16 ENCOUNTER — Encounter: Payer: Self-pay | Admitting: Nurse Practitioner

## 2022-06-16 ENCOUNTER — Ambulatory Visit (INDEPENDENT_AMBULATORY_CARE_PROVIDER_SITE_OTHER): Payer: Medicare HMO | Admitting: Nurse Practitioner

## 2022-06-16 DIAGNOSIS — Z0000 Encounter for general adult medical examination without abnormal findings: Secondary | ICD-10-CM

## 2022-06-16 NOTE — Patient Instructions (Signed)
Emily Hayes , Thank you for taking time to come for your Medicare Wellness Visit. I appreciate your ongoing commitment to your health goals. Please review the following plan we discussed and let me know if I can assist you in the future.   Screening recommendations/referrals: Colonoscopy up to date Mammogram up to date- due soon Bone Density up to date Recommended yearly ophthalmology/optometry visit for glaucoma screening and checkup Recommended yearly dental visit for hygiene and checkup  Vaccinations: Influenza vaccine- due annually in September/October- you have declined this Pneumococcal vaccine - Tdap vaccine up to date Shingles vaccine up to date     Advanced directives: recommended to bring updated copy to office to put on file.   Conditions/risks identified: advance age  Next appointment: yearly- next in person.    Preventive Care 1 Years and Older, Female Preventive care refers to lifestyle choices and visits with your health care provider that can promote health and wellness. What does preventive care include? A yearly physical exam. This is also called an annual well check. Dental exams once or twice a year. Routine eye exams. Ask your health care provider how often you should have your eyes checked. Personal lifestyle choices, including: Daily care of your teeth and gums. Regular physical activity. Eating a healthy diet. Avoiding tobacco and drug use. Limiting alcohol use. Practicing safe sex. Taking low-dose aspirin every day. Taking vitamin and mineral supplements as recommended by your health care provider. What happens during an annual well check? The services and screenings done by your health care provider during your annual well check will depend on your age, overall health, lifestyle risk factors, and family history of disease. Counseling  Your health care provider may ask you questions about your: Alcohol use. Tobacco use. Drug use. Emotional  well-being. Home and relationship well-being. Sexual activity. Eating habits. History of falls. Memory and ability to understand (cognition). Work and work Statistician. Reproductive health. Screening  You may have the following tests or measurements: Height, weight, and BMI. Blood pressure. Lipid and cholesterol levels. These may be checked every 5 years, or more frequently if you are over 21 years old. Skin check. Lung cancer screening. You may have this screening every year starting at age 70 if you have a 30-pack-year history of smoking and currently smoke or have quit within the past 15 years. Fecal occult blood test (FOBT) of the stool. You may have this test every year starting at age 19. Flexible sigmoidoscopy or colonoscopy. You may have a sigmoidoscopy every 5 years or a colonoscopy every 10 years starting at age 92. Hepatitis C blood test. Hepatitis B blood test. Sexually transmitted disease (STD) testing. Diabetes screening. This is done by checking your blood sugar (glucose) after you have not eaten for a while (fasting). You may have this done every 1-3 years. Bone density scan. This is done to screen for osteoporosis. You may have this done starting at age 34. Mammogram. This may be done every 1-2 years. Talk to your health care provider about how often you should have regular mammograms. Talk with your health care provider about your test results, treatment options, and if necessary, the need for more tests. Vaccines  Your health care provider may recommend certain vaccines, such as: Influenza vaccine. This is recommended every year. Tetanus, diphtheria, and acellular pertussis (Tdap, Td) vaccine. You may need a Td booster every 10 years. Zoster vaccine. You may need this after age 69. Pneumococcal 13-valent conjugate (PCV13) vaccine. One dose is recommended after  age 35. Pneumococcal polysaccharide (PPSV23) vaccine. One dose is recommended after age 26. Talk to your  health care provider about which screenings and vaccines you need and how often you need them. This information is not intended to replace advice given to you by your health care provider. Make sure you discuss any questions you have with your health care provider. Document Released: 11/01/2015 Document Revised: 06/24/2016 Document Reviewed: 08/06/2015 Elsevier Interactive Patient Education  2017 [employer] Prevention in the Home Falls can cause injuries. They can happen to people of all ages. There are many things you can do to make your home safe and to help prevent falls. What can I do on the outside of my home? Regularly fix the edges of walkways and driveways and fix any cracks. Remove anything that might make you trip as you walk through a door, such as a raised step or threshold. Trim any bushes or trees on the path to your home. Use bright outdoor lighting. Clear any walking paths of anything that might make someone trip, such as rocks or tools. Regularly check to see if handrails are loose or broken. Make sure that both sides of any steps have handrails. Any raised decks and porches should have guardrails on the edges. Have any leaves, snow, or ice cleared regularly. Use sand or salt on walking paths during winter. Clean up any spills in your garage right away. This includes oil or grease spills. What can I do in the bathroom? Use night lights. Install grab bars by the toilet and in the tub and shower. Do not use towel bars as grab bars. Use non-skid mats or decals in the tub or shower. If you need to sit down in the shower, use a plastic, non-slip stool. Keep the floor dry. Clean up any water that spills on the floor as soon as it happens. Remove soap buildup in the tub or shower regularly. Attach bath mats securely with double-sided non-slip rug tape. Do not have throw rugs and other things on the floor that can make you trip. What can I do in the bedroom? Use night  lights. Make sure that you have a light by your bed that is easy to reach. Do not use any sheets or blankets that are too big for your bed. They should not hang down onto the floor. Have a firm chair that has side arms. You can use this for support while you get dressed. Do not have throw rugs and other things on the floor that can make you trip. What can I do in the kitchen? Clean up any spills right away. Avoid walking on wet floors. Keep items that you use a lot in easy-to-reach places. If you need to reach something above you, use a strong step stool that has a grab bar. Keep electrical cords out of the way. Do not use floor polish or wax that makes floors slippery. If you must use wax, use non-skid floor wax. Do not have throw rugs and other things on the floor that can make you trip. What can I do with my stairs? Do not leave any items on the stairs. Make sure that there are handrails on both sides of the stairs and use them. Fix handrails that are broken or loose. Make sure that handrails are as long as the stairways. Check any carpeting to make sure that it is firmly attached to the stairs. Fix any carpet that is loose or worn. Avoid having throw rugs  at the top or bottom of the stairs. If you do have throw rugs, attach them to the floor with carpet tape. Make sure that you have a light switch at the top of the stairs and the bottom of the stairs. If you do not have them, ask someone to add them for you. What else can I do to help prevent falls? Wear shoes that: Do not have high heels. Have rubber bottoms. Are comfortable and fit you well. Are closed at the toe. Do not wear sandals. If you use a stepladder: Make sure that it is fully opened. Do not climb a closed stepladder. Make sure that both sides of the stepladder are locked into place. Ask someone to hold it for you, if possible. Clearly mark and make sure that you can see: Any grab bars or handrails. First and last  steps. Where the edge of each step is. Use tools that help you move around (mobility aids) if they are needed. These include: Canes. Walkers. Scooters. Crutches. Turn on the lights when you go into a dark area. Replace any light bulbs as soon as they burn out. Set up your furniture so you have a clear path. Avoid moving your furniture around. If any of your floors are uneven, fix them. If there are any pets around you, be aware of where they are. Review your medicines with your doctor. Some medicines can make you feel dizzy. This can increase your chance of falling. Ask your doctor what other things that you can do to help prevent falls. This information is not intended to replace advice given to you by your health care provider. Make sure you discuss any questions you have with your health care provider. Document Released: 08/01/2009 Document Revised: 03/12/2016 Document Reviewed: 11/09/2014 Elsevier Interactive Patient Education  2017 [employer].

## 2022-06-16 NOTE — Progress Notes (Signed)
Subjective:   Emily Hayes is a 71 y.o. female who presents for Medicare Annual (Subsequent) preventive examination.  Review of Systems     Cardiac Risk Factors include: advanced age (>27mn, >>53women);dyslipidemia;hypertension;diabetes mellitus     Objective:    There were no vitals filed for this visit. There is no height or weight on file to calculate BMI.     06/16/2022   10:12 AM 05/15/2022   10:52 AM 05/05/2022    2:39 PM 08/01/2021   11:41 AM 06/12/2021    1:51 PM 06/04/2021   10:19 AM 02/13/2020    2:04 PM  Advanced Directives  Does Patient Have a Medical Advance Directive? Yes Yes Yes Yes Yes No No  Type of AParamedicof AVirginLiving will [place]um[place]Living will [place]Living will;Out of facility DNR (pink MOST or yellow form) Living will Living will    Does patient want to make changes to medical advance directive? No - Patient declined  No - Patient declined No - Patient declined     Copy of [place] Chart? No - copy requested  No - copy requested      Would patient like information on creating a medical advance directive?      No - Patient declined     Current Medications (verified) Outpatient Encounter Medications as of 06/16/2022  Medication Sig   acetaminophen (TYLENOL) 500 MG tablet Take 500 mg by mouth daily as needed.   aspirin EC 81 MG tablet Take 1 tablet (81 mg total) by mouth daily. Swallow whole.   atorvastatin (LIPITOR) 10 MG tablet TAKE 1 TABLET(10 MG) BY MOUTH DAILY   Calcium Citrate-Vitamin D (CALCIUM CITRATE + D PO) Take 1 tablet by mouth 2 (two) times daily.   Cholecalciferol (VITAMIN D-3) 25 MCG (1000 UT) CAPS Take 1 capsule by mouth in the morning and at bedtime.   cyclobenzaprine (FLEXERIL) 10 MG tablet Take 1 tablet (10 mg total) by mouth 2 (two) times daily as needed for muscle spasms.   diphenhydrAMINE (BENADRYL) 25 MG tablet Take 25 mg by mouth as  needed.    fluticasone (FLONASE) 50 MCG/ACT nasal spray Place 1 spray into both nostrils as needed for allergies or rhinitis.   lidocaine 4 % Place 1 patch onto the skin as needed.   losartan (COZAAR) 25 MG tablet TAKE 1 TABLET(25 MG) BY MOUTH DAILY   Misc Natural Products (COLON CLEANSE) CAPS Take by mouth. As needed   Simethicone (GAS RELIEF EXTRA STRENGTH PO) Take 1 capsule by mouth as needed.   vitamin E 400 UNIT capsule Take 1,200 Units by mouth daily.    [DISCONTINUED] HYDROcodone-acetaminophen (NORCO/VICODIN) 5-325 MG tablet Take 1 tablet by mouth every 4 (four) hours as needed.   [DISCONTINUED] lidocaine (LIDODERM) 5 % Place 1 patch onto the skin daily. Remove & Discard patch within 12 hours or as directed by MD   [DISCONTINUED] methylPREDNISolone (MEDROL DOSEPAK) 4 MG TBPK tablet Follow instructions on pack   No facility-administered encounter medications on file as of 06/16/2022.    Allergies (verified) Patient has no known allergies.   History: Past Medical History:  Diagnosis Date   Cervical radiculopathy    Fatigue    Generalized osteoarthrosis    HTN (hypertension)    Hyperlipidemia    Lateral epicondylitis of left elbow 06/28/2017   Left knee pain    Menopause    Neck pain    Pneumonia  Pre-diabetes    Sciatica    Swelling of left knee joint    Uterine cancer [facility])    Past Surgical History:  Procedure Laterality Date   ABCESS DRAINAGE Left 09/15/2017   behind tonsil   APPENDECTOMY     VAGINAL HYSTERECTOMY  1983   Family History  Problem Relation Age of Onset   Stroke Mother    Diabetes Mother    Dementia Mother    Alzheimer's disease Mother    Colon polyps Brother    Hypertension Brother    Hyperlipidemia Brother    Cancer Sister        unknown   Cancer Sister        unknown   Kidney failure Brother    Cancer Sister        unknown   Lung cancer Brother    Cancer Sister        unknown   Colon cancer Neg Hx    Esophageal cancer Neg Hx     Rectal cancer Neg Hx    Stomach cancer Neg Hx    Social History   Socioeconomic History   Marital status: Divorced    Spouse name: Not on file   Number of children: 2   Years of education: Not on file   Highest education level: Not on file  Occupational History   Occupation: retired  Tobacco Use   Smoking status: Every Day    Packs/day: 0.50    Years: 50.00    Total pack years: 25.00    Types: Cigarettes   Smokeless tobacco: Never  Vaping Use   Vaping Use: Never used  Substance and Sexual Activity   Alcohol use: No   Drug use: No   Sexual activity: Not on file  Other Topics Concern   Not on file  Social History Narrative   Social History      Diet? none      Do you drink/eat things with caffeine? Chocolate- occasionally      Marital status?        divorced                            What year were you married? 1982      Do you live in a house, apartment, assisted living, condo, trailer, etc.? apartment      Is it one or more stories? 1      How many persons live in your home? 1       Do you have any pets in your home? (please list) yes, dog      Highest level of education completed? Associate degree- college      Current or past profession: retired      Do you exercise?           yes                           Type & how often? Walk- daily      Advanced Directives      Do you have a living will? yes      Do you have a DNR form?     yes                             If not, do you want to discuss one? yes  Do you have signed POA/HPOA for forms? no      Functional Status      Do you have difficulty bathing or dressing yourself?      Do you have difficulty preparing food or eating?       Do you have difficulty managing your medications?      Do you have difficulty managing your finances?      Do you have difficulty affording your medications?   Social Determinants of Health   Financial Resource Strain: Low Risk  (01/05/2018)   Overall Financial  Resource Strain (CARDIA)    Difficulty of Paying Living Expenses: Not hard at all  Food Insecurity: No Food Insecurity (01/05/2018)   Hunger Vital Sign    Worried About Running Out of Food in the Last Year: Never true    Ran Out of Food in the Last Year: Never true  Transportation Needs: No Transportation Needs (01/05/2018)   PRAPARE - Hydrologist (Medical): No    Lack of Transportation (Non-Medical): No  Physical Activity: Insufficiently Active (01/05/2018)   Exercise Vital Sign    Days of Exercise per Week: 3 days    Minutes of Exercise per Session: 20 min  Stress: No Stress Concern Present (01/05/2018)   [place]    Feeling of Stress : Only a little  Social Connections: Unknown (01/05/2018)   Social Connection and Isolation Panel [NHANES]    Frequency of Communication with Friends and Family: More than three times a week    Frequency of Social Gatherings with Friends and Family: Once a week    Attends Religious Services: Not on Advertising copywriter or Organizations: No    Attends Archivist Meetings: Never    Marital Status: Divorced    Tobacco Counseling Ready to quit: Not Answered Counseling given: Not Answered   Clinical Intake:  Pre-visit preparation completed: Yes  Pain : No/denies pain     BMI - recorded: 21 Nutritional Status: BMI of 19-24  Normal Diabetes: Yes  How often do you need to have someone help you when you read instructions, pamphlets, or other written materials from your doctor or pharmacy?: 1 - Never  Diabetic?no         Activities of Daily Living    06/16/2022    3:21 PM  In your present state of health, do you have any difficulty performing the following activities:  Hearing? 0  Vision? 0  Difficulty concentrating or making decisions? 0  Walking or climbing stairs? 0  Dressing or bathing? 0  Doing errands, shopping? 0   Preparing Food and eating ? N  Using the Toilet? N  In the past six months, have you accidently leaked urine? N  Do you have problems with loss of bowel control? N  Managing your Medications? N  Managing your Finances? N  Housekeeping or managing your Housekeeping? N    Patient Care Team: [name], NP as PCP - General (Geriatric Medicine)  Indicate any recent Medical Services you may have received from other than [facility] providers in the past year (date may be approximate).     Assessment:   This is a routine wellness examination for [place].  Hearing/Vision screen Vision Screening - Comments:: Last eye exam less than 12 months ago, Dr[facility], in [facility]   Dietary issues and exercise activities discussed: Current Exercise Habits: Home  exercise routine, Type of exercise: stretching;calisthenics, Time (Minutes): 30, Frequency (Times/Week): 3, Weekly Exercise (Minutes/Week): 90   Goals Addressed   None    Depression Screen    06/16/2022   10:09 AM 06/12/2021    1:50 PM 06/04/2021   10:18 AM 09/06/2020   11:44 AM 02/13/2020    2:01 PM 02/10/2019    9:49 AM 01/12/2018    1:21 PM  PHQ 2/9 Scores  PHQ - 2 Score 0 0 0 0 0 0 0    Fall Risk    06/16/2022   10:09 AM 05/05/2022    2:39 PM 08/01/2021   11:41 AM 06/12/2021    1:49 PM 06/04/2021   10:18 AM  Fall Risk   Falls in the past year? 0 0 0 0 0  Number falls in past yr: 0 0 0 0 0  Injury with Fall? 0 0 0 0 0  Risk for fall due to : No Fall Risks No Fall Risks No Fall Risks No Fall Risks No Fall Risks  Follow up Falls evaluation completed Falls evaluation completed Falls evaluation completed Falls evaluation completed Falls evaluation completed    FALL RISK PREVENTION PERTAINING TO THE HOME:  Any stairs in or around the home? Yes  If so, are there any without handrails? No  Home free of loose throw rugs in walkways, pet beds, electrical cords, etc? Yes  Adequate lighting in your home to  reduce risk of falls? Yes   ASSISTIVE DEVICES UTILIZED TO PREVENT FALLS:  Life alert? No  Use of a cane, walker or w/c? No  Grab bars in the bathroom? No  Shower chair or bench in shower? No  Elevated toilet seat or a handicapped toilet? Yes   TIMED UP AND GO:  Was the test performed? No .    Cognitive Function:    01/05/2018    8:45 AM  MMSE - Mini Mental State Exam  Orientation to time 5  Orientation to Place 5  Registration 3  Attention/ Calculation 5  Recall 3  Language- name 2 objects 2  Language- repeat 1  Language- follow 3 step command 3  Language- read & follow direction 1  Write a sentence 1  Copy design 1  Total score 30        06/16/2022   10:10 AM 06/12/2021    1:52 PM 02/13/2020    2:02 PM 02/10/2019    9:49 AM  6CIT Screen  What Year? 0 points 0 points 0 points 0 points  What month? 0 points 0 points 0 points 0 points  What time? 0 points 0 points 0 points 0 points  Count back from 20 0 points 0 points 0 points 0 points  Months in reverse 0 points 0 points 0 points 0 points  Repeat phrase 0 points 0 points 0 points 2 points  Total Score 0 points 0 points 0 points 2 points    Immunizations Immunization History  Administered Date(s) Administered   Moderna Covid-19 Vaccine Bivalent Booster 22yr & up 06/25/2021   Moderna SARS-COV2 Booster Vaccination 01/28/2021   Moderna Sars-Covid-2 Vaccination 12/01/2019, 12/29/2019, 08/17/2020   Tdap 02/05/2011   Zoster Recombinat (Shingrix) 03/19/2017, 07/19/2017    TDAP status: Due, Education has been provided regarding the importance of this vaccine. Advised may receive this vaccine at local pharmacy or Health Dept. Aware to provide a copy of the vaccination record if obtained from local pharmacy or Health Dept. Verbalized acceptance and understanding.  Flu Vaccine status:  Due, Education has been provided regarding the importance of this vaccine. Advised may receive this vaccine at local pharmacy or Health  Dept. Aware to provide a copy of the vaccination record if obtained from local pharmacy or Health Dept. Verbalized acceptance and understanding.  Pneumococcal vaccine status: Due, Education has been provided regarding the importance of this vaccine. Advised may receive this vaccine at local pharmacy or Health Dept. Aware to provide a copy of the vaccination record if obtained from local pharmacy or Health Dept. Verbalized acceptance and understanding.  Covid-19 vaccine status: Information provided on how to obtain vaccines.   Qualifies for Shingles Vaccine? Yes   Zostavax completed Yes   Shingrix Completed?: No.    Education has been provided regarding the importance of this vaccine. Patient has been advised to call insurance company to determine out of pocket expense if they have not yet received this vaccine. Advised may also receive vaccine at local pharmacy or Health Dept. Verbalized acceptance and understanding.  Screening Tests Health Maintenance  Topic Date Due   OPHTHALMOLOGY EXAM  Never done   TETANUS/TDAP  02/04/2021   FOOT EXAM  09/06/2021   COVID-19 Vaccine (5 - Moderna series) 10/25/2021   HEMOGLOBIN A1C  12/11/2021   Pneumonia Vaccine 53+ Years old (1 - PCV) 06/17/2023 (Originally 08/12/1957)   MAMMOGRAM  07/05/2023   COLONOSCOPY (Pts 45-20yr Insurance coverage will need to be confirmed)  01/02/2031   DEXA SCAN  Completed   Hepatitis C Screening  Completed   Zoster Vaccines- Shingrix  Completed   HPV VACCINES  Aged Out   INFLUENZA VACCINE  Discontinued   Fecal DNA (Cologuard)  Discontinued    Health Maintenance  Health Maintenance Due  Topic Date Due   OPHTHALMOLOGY EXAM  Never done   TETANUS/TDAP  02/04/2021   FOOT EXAM  09/06/2021   COVID-19 Vaccine (5 - Moderna series) 10/25/2021   HEMOGLOBIN A1C  12/11/2021    Colorectal cancer screening: Type of screening: Colonoscopy. Completed 2022. Repeat every 10 years  Mammogram status: Completed 9/22. Repeat every  year  Bone Density status: Completed 11/26/21. Results reflect: Bone density results: OSTEOPENIA. Repeat every 2 years.  Lung Cancer Screening: (Low Dose CT Chest recommended if Age 73-80years, 30 pack-year currently smoking OR have quit w/in 15years.) does not qualify.   Lung Cancer Screening Referral: na  Additional Screening:  Hepatitis C Screening: does qualify; Completed 2021   Vision Screening: Recommended annual ophthalmology exams for early detection of glaucoma and other disorders of the eye. Is the patient up to date with their annual eye exam?  Yes  Who is the provider or what is the name of the office in which the patient attends annual eye exams[place] not established with a provider, would they like to be referred to a provider to establish care? No .   Dental Screening: Recommended annual dental exams for proper oral hygiene  Community Resource Referral / Chronic Care Management: CRR required this visit?  No   CCM required this visit?  No      Plan:     I have personally reviewed and noted the following in the patient's chart:   Medical and social history Use of alcohol, tobacco or illicit drugs  Current medications and supplements including opioid prescriptions. Patient is currently taking opioid prescriptions. Information provided to patient regarding non-opioid alternatives. Patient advised to discuss non-opioid treatment plan with their provider. Functional ability and status Nutritional status Physical activity  Advanced directives List of other physicians Hospitalizations, surgeries, and ER visits in previous 12 months Vitals Screenings to include cognitive, depression, and falls Referrals and appointments  In addition, I have reviewed and discussed with patient certain preventive protocols, quality metrics, and best practice recommendations. A written personalized care plan for preventive services as well as general preventive  health recommendations were provided to patient.     [name], NP   06/16/2022   Virtual Visit via Telephone Note  I connected with patient 06/16/22 at  3:20 PM EDT by telephone and verified that I am speaking with the correct person using two identifiers.  Location: Patient: home Provider: [place]    I discussed the limitations, risks, security and privacy concerns of performing an evaluation and management service by telephone and the availability of in person appointments. I also discussed with the patient that there may be a patient responsible charge related to this service. The patient expressed understanding and agreed to proceed.   I discussed the assessment and treatment plan with the patient. The patient was provided an opportunity to ask questions and all were answered. The patient agreed with the plan and demonstrated an understanding of the instructions.   The patient was advised to call back or seek an in-person evaluation if the symptoms worsen or if the condition fails to improve as anticipated.  I provided 18 minutes of non-face-to-face time during this encounter.  Carlos American. Harle Battiest Avs printed and mailed

## 2022-06-16 NOTE — Progress Notes (Signed)
   This service is provided via telemedicine  No vital signs collected/recorded due to the encounter was a telemedicine visit.   Location of patient (ex: home, work):  Home  Patient consents to a telephone visit: Yes, see telephone visit dated 06/16/22  Location of the provider (ex: office, home):  [place], [place] Location   Name of any referring provider:  N/A  Names of all persons participating in the telemedicine service and their role in the encounter:  S.Chrae B/CMA, [name], NP, and Patient   Time spent on call:  12 min with medical assistant

## 2022-06-26 ENCOUNTER — Encounter: Payer: Self-pay | Admitting: Nurse Practitioner

## 2022-06-29 ENCOUNTER — Encounter: Payer: Self-pay | Admitting: Nurse Practitioner

## 2022-06-29 ENCOUNTER — Ambulatory Visit (INDEPENDENT_AMBULATORY_CARE_PROVIDER_SITE_OTHER): Payer: Medicare HMO | Admitting: Nurse Practitioner

## 2022-06-29 VITALS — BP 132/78 | HR 90 | Temp 97.1°F | Ht 66.08 in | Wt 132.0 lb

## 2022-06-29 DIAGNOSIS — E782 Mixed hyperlipidemia: Secondary | ICD-10-CM | POA: Diagnosis not present

## 2022-06-29 DIAGNOSIS — K5900 Constipation, unspecified: Secondary | ICD-10-CM

## 2022-06-29 DIAGNOSIS — E1169 Type 2 diabetes mellitus with other specified complication: Secondary | ICD-10-CM | POA: Diagnosis not present

## 2022-06-29 DIAGNOSIS — F17200 Nicotine dependence, unspecified, uncomplicated: Secondary | ICD-10-CM | POA: Diagnosis not present

## 2022-06-29 DIAGNOSIS — Z66 Do not resuscitate: Secondary | ICD-10-CM | POA: Diagnosis not present

## 2022-06-29 DIAGNOSIS — I10 Essential (primary) hypertension: Secondary | ICD-10-CM

## 2022-06-29 DIAGNOSIS — E785 Hyperlipidemia, unspecified: Secondary | ICD-10-CM | POA: Diagnosis not present

## 2022-06-29 DIAGNOSIS — M62838 Other muscle spasm: Secondary | ICD-10-CM | POA: Diagnosis not present

## 2022-06-29 DIAGNOSIS — M8580 Other specified disorders of bone density and structure, unspecified site: Secondary | ICD-10-CM | POA: Diagnosis not present

## 2022-06-29 MED ORDER — CYCLOBENZAPRINE HCL 10 MG PO TABS: 10.0000 mg | ORAL_TABLET | Freq: Two times a day (BID) | ORAL | 5 refills | Status: DC | PRN
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
## 2022-06-29 NOTE — Patient Instructions (Signed)
Please sign a record release for ophthalmology on check out. 

## 2022-06-30 LAB — CBC WITH DIFFERENTIAL/PLATELET
Absolute Monocytes: 577 cells/uL (ref 200–950)
Basophils Absolute: 50 cells/uL (ref 0–200)
Basophils Relative: 0.8 %
Eosinophils Absolute: 68 cells/uL (ref 15–500)
Eosinophils Relative: 1.1 %
HCT: 36.3 % (ref 35.0–45.0)
Hemoglobin: 11.9 g/dL (ref 11.7–15.5)
Lymphs Abs: 2629 cells/uL (ref 850–3900)
MCH: 26.4 pg — ABNORMAL LOW (ref 27.0–33.0)
MCHC: 32.8 g/dL (ref 32.0–36.0)
MCV: 80.5 fL (ref 80.0–100.0)
MPV: 11.5 fL (ref 7.5–12.5)
Monocytes Relative: 9.3 %
Neutro Abs: 2877 cells/uL (ref 1500–7800)
Neutrophils Relative %: 46.4 %
Platelets: 259 10*3/uL (ref 140–400)
RBC: 4.51 10*6/uL (ref 3.80–5.10)
RDW: 15.4 % — ABNORMAL HIGH (ref 11.0–15.0)
Total Lymphocyte: 42.4 %
WBC: 6.2 10*3/uL (ref 3.8–10.8)

## 2022-06-30 LAB — COMPLETE METABOLIC PANEL WITH GFR
AG Ratio: 1.7 (calc) (ref 1.0–2.5)
ALT: 10 U/L (ref 6–29)
AST: 17 U/L (ref 10–35)
Albumin: 4.5 g/dL (ref 3.6–5.1)
Alkaline phosphatase (APISO): 58 U/L (ref 37–153)
BUN: 15 mg/dL (ref 7–25)
CO2: 25 mmol/L (ref 20–32)
Calcium: 9.7 mg/dL (ref 8.6–10.4)
Chloride: 103 mmol/L (ref 98–110)
Creat: 0.8 mg/dL (ref 0.60–1.00)
Globulin: 2.7 g/dL (calc) (ref 1.9–3.7)
Glucose, Bld: 108 mg/dL — ABNORMAL HIGH (ref 65–99)
Potassium: 4.6 mmol/L (ref 3.5–5.3)
Sodium: 139 mmol/L (ref 135–146)
Total Bilirubin: 0.4 mg/dL (ref 0.2–1.2)
Total Protein: 7.2 g/dL (ref 6.1–8.1)
eGFR: 78 mL/min/{1.73_m2} (ref >=60)

## 2022-06-30 LAB — LIPID PANEL
Cholesterol: 205 mg/dL — ABNORMAL HIGH (ref <200)
HDL: 66 mg/dL (ref >=50)
LDL Cholesterol (Calc): 120 mg/dL (calc) — ABNORMAL HIGH
Non-HDL Cholesterol (Calc): 139 mg/dL (calc) — ABNORMAL HIGH (ref <130)
Total CHOL/HDL Ratio: 3.1 (calc) (ref <5.0)
Triglycerides: 93 mg/dL (ref <150)

## 2022-06-30 LAB — HEMOGLOBIN A1C
Hgb A1c MFr Bld: 6.5 % of total Hgb — ABNORMAL HIGH (ref <5.7)
Mean Plasma Glucose: 140 mg/dL
eAG (mmol/L): 7.7 mmol/L

## 2022-10-15 ENCOUNTER — Ambulatory Visit: Payer: Medicare HMO | Admitting: Nurse Practitioner

## 2022-10-16 ENCOUNTER — Other Ambulatory Visit: Payer: Self-pay | Admitting: Nurse Practitioner

## 2022-10-16 DIAGNOSIS — Z1231 Encounter for screening mammogram for malignant neoplasm of breast: Secondary | ICD-10-CM

## 2022-10-20 ENCOUNTER — Encounter: Payer: Self-pay | Admitting: Nurse Practitioner

## 2022-10-21 ENCOUNTER — Ambulatory Visit (INDEPENDENT_AMBULATORY_CARE_PROVIDER_SITE_OTHER): Payer: Medicare HMO | Admitting: Nurse Practitioner

## 2022-10-21 ENCOUNTER — Encounter: Payer: Self-pay | Admitting: Nurse Practitioner

## 2022-10-21 VITALS — BP 145/80 | HR 86 | Temp 97.3°F | Ht 66.0 in | Wt 134.0 lb

## 2022-10-21 DIAGNOSIS — E1169 Type 2 diabetes mellitus with other specified complication: Secondary | ICD-10-CM | POA: Diagnosis not present

## 2022-10-21 DIAGNOSIS — I10 Essential (primary) hypertension: Secondary | ICD-10-CM | POA: Diagnosis not present

## 2022-10-21 DIAGNOSIS — E782 Mixed hyperlipidemia: Secondary | ICD-10-CM

## 2022-10-21 DIAGNOSIS — M79641 Pain in right hand: Secondary | ICD-10-CM | POA: Insufficient documentation

## 2022-10-21 DIAGNOSIS — E785 Hyperlipidemia, unspecified: Secondary | ICD-10-CM

## 2022-10-21 DIAGNOSIS — Z1231 Encounter for screening mammogram for malignant neoplasm of breast: Secondary | ICD-10-CM

## 2022-10-21 MED ORDER — LOSARTAN POTASSIUM 25 MG PO TABS: 25.0000 mg | ORAL_TABLET | Freq: Every day | ORAL | 5 refills | Status: DC

## 2022-10-21 MED ORDER — ATORVASTATIN CALCIUM 10 MG PO TABS: 10.0000 mg | ORAL_TABLET | Freq: Every day | ORAL | 1 refills | Status: DC

## 2022-10-21 NOTE — Progress Notes (Signed)
Careteam: Patient Care Team: [name], NP as PCP - General (Geriatric Medicine)  PLACE OF SERVICE:  [place] Directive information    No Known Allergies  Chief Complaint  Patient presents with   Acute Visit    Right hand concerns, patient had an injury 4 years ago. Patient with raised area on right hand.  Foot exam today. Patient off losartan x 1 month or longer due to good b/p readings at home.      HPI: Patient is a 72 y.o. female for right hand concerns.  Has had area for 4 years on right hand in her palm but over the holidays was baking cookies and became swollen and very bothersome. She stopped using her hand/rest and it improved.   Left hand knot on middle finger, does not bother her unless it gets hit- has been there 20-30 years.   145/80 on recheck blood pressure- stopped losartan due to pill burden  Review of Systems:  Review of Systems  Musculoskeletal:  Positive for joint pain and myalgias.  Neurological:  Negative for dizziness and headaches.   Past Medical History:  Diagnosis Date   Cervical radiculopathy    Fatigue    Generalized osteoarthrosis    HTN (hypertension)    Hyperlipidemia    Lateral epicondylitis of left elbow 06/28/2017   Left knee pain    Menopause    Neck pain    Pneumonia    Pre-diabetes    Sciatica    Swelling of left knee joint    Uterine cancer [facility])    Past Surgical History:  Procedure Laterality Date   ABCESS DRAINAGE Left 09/15/2017   behind tonsil   APPENDECTOMY     VAGINAL HYSTERECTOMY  1983   Social History:   reports that she has been smoking cigarettes. She has a 25.00 pack-year smoking history. She has never used smokeless tobacco. She reports that she does not drink alcohol and does not use drugs.  Family History  Problem Relation Age of Onset   Stroke Mother    Diabetes Mother    Dementia Mother    Alzheimer's disease Mother    Colon polyps Brother    Hypertension Brother     Hyperlipidemia Brother    Cancer Sister        unknown   Cancer Sister        unknown   Kidney failure Brother    Cancer Sister        unknown   Lung cancer Brother    Cancer Sister        unknown   Colon cancer Neg Hx    Esophageal cancer Neg Hx    Rectal cancer Neg Hx    Stomach cancer Neg Hx     Medications: Patient's Medications  New Prescriptions   No medications on file  Previous Medications   ACETAMINOPHEN (TYLENOL) 500 MG TABLET    Take 500 mg by mouth daily as needed.   ASPIRIN EC 81 MG TABLET    Take 1 tablet (81 mg total) by mouth daily. Swallow whole.   ATORVASTATIN (LIPITOR) 10 MG TABLET    TAKE 1 TABLET(10 MG) BY MOUTH DAILY   CALCIUM CITRATE-VITAMIN D (CALCIUM CITRATE + D PO)    Take 1 tablet by mouth 2 (two) times daily.   CHOLECALCIFEROL (VITAMIN D-3) 25 MCG (1000 UT) CAPS    Take 1 capsule by mouth in the morning and at bedtime.   CYCLOBENZAPRINE (FLEXERIL)  10 MG TABLET    Take 1 tablet (10 mg total) by mouth 2 (two) times daily as needed for muscle spasms.   DIPHENHYDRAMINE (BENADRYL) 25 MG TABLET    Take 25 mg by mouth as needed.    FLUTICASONE (FLONASE) 50 MCG/ACT NASAL SPRAY    Place 1 spray into both nostrils as needed for allergies or rhinitis.   LIDOCAINE 4 %    Place 1 patch onto the skin as needed.   LOSARTAN (COZAAR) 25 MG TABLET    TAKE 1 TABLET(25 MG) BY MOUTH DAILY   MISC NATURAL PRODUCTS (COLON CLEANSE) CAPS    Take by mouth. As needed   SIMETHICONE (GAS RELIEF EXTRA STRENGTH PO)    Take 1 capsule by mouth as needed.   VITAMIN E 400 UNIT CAPSULE    Take 1,200 Units by mouth daily.   Modified Medications   No medications on file  Discontinued Medications   No medications on file    Physical Exam:  Vitals:   10/20/22 1413  BP: (!) 132/90  Pulse: 86  Temp: (!) 97.3 F (36.3 C)  TempSrc: Temporal  SpO2: 99%  Weight: 134 lb (60.8 kg)  Height: '5\' 6"'$  (1.676 m)   Body mass index is 21.63 kg/m. Wt Readings from Last 3 Encounters:   10/20/22 134 lb (60.8 kg)  06/29/22 132 lb (59.9 kg)  05/15/22 142 lb (64.4 kg)    Physical Exam Constitutional:      Appearance: Normal appearance.  Pulmonary:     Effort: Pulmonary effort is normal.  Musculoskeletal:       Hands:     Comments: Nodular tender area on palm  Neurological:     Mental Status: She is alert. Mental status is at baseline.  Psychiatric:        Mood and Affect: Mood normal.     Labs reviewed: Basic Metabolic Panel: Recent Labs    06/29/22 0914  NA 139  K 4.6  CL 103  CO2 25  GLUCOSE 108*  BUN 15  CREATININE 0.80  CALCIUM 9.7   Liver Function Tests: Recent Labs    06/29/22 0914  AST 17  ALT 10  BILITOT 0.4  PROT 7.2   No results for input(s): "LIPASE", "AMYLASE" in the last 8760 hours. No results for input(s): "AMMONIA" in the last 8760 hours. CBC: Recent Labs    06/29/22 0914  WBC 6.2  NEUTROABS 2,877  HGB 11.9  HCT 36.3  MCV 80.5  PLT 259   Lipid Panel: Recent Labs    06/29/22 0914  CHOL 205*  HDL 66  LDLCALC 120*  TRIG 93  CHOLHDL 3.1   TSH: No results for input(s): "TSH" in the last 8760 hours. A1C: Lab Results  Component Value Date   HGBA1C 6.5 (H) 06/29/2022     Assessment/Plan 1. Pain of right hand -tender cyst that changes in size with use, will refer to orthopedic hand specialist.  - AMB referral to orthopedics  2. Primary hypertension -slightly elevated again on recheck, due to diabetes willing to restart - losartan (COZAAR) 25 MG tablet; Take 1 tablet (25 mg total) by mouth daily.  Dispense: 30 tablet; Refill: 5  3. Type 2 diabetes mellitus with hyperlipidemia ([place]) -discuss restarting losartan for renal protection, she is agreeable.  - losartan (COZAAR) 25 MG tablet; Take 1 tablet (25 mg total) by mouth daily.  Dispense: 30 tablet; Refill: 5  4. Mixed hyperlipidemia Refill provided  - atorvastatin (LIPITOR) 10 MG tablet;  Take 1 tablet (10 mg total) by mouth daily.  Dispense: 90 tablet;  Refill: 1   Next follow up: 12/28/2022 Janett Billow K. [place], [facility] [phone]

## 2022-10-21 NOTE — Patient Instructions (Signed)
If you do not hear back about referral in a week please call our office back and ask for an update on referral.   Start your losartan back.

## 2022-10-31 ENCOUNTER — Other Ambulatory Visit: Payer: Self-pay | Admitting: Nurse Practitioner

## 2022-10-31 DIAGNOSIS — E782 Mixed hyperlipidemia: Secondary | ICD-10-CM

## 2022-11-03 ENCOUNTER — Ambulatory Visit (INDEPENDENT_AMBULATORY_CARE_PROVIDER_SITE_OTHER): Payer: Medicare HMO | Admitting: Orthopaedic Surgery

## 2022-11-03 ENCOUNTER — Ambulatory Visit (INDEPENDENT_AMBULATORY_CARE_PROVIDER_SITE_OTHER): Payer: Medicare HMO

## 2022-11-03 DIAGNOSIS — M79641 Pain in right hand: Secondary | ICD-10-CM | POA: Diagnosis not present

## 2022-11-03 NOTE — Progress Notes (Signed)
Office Visit Note   Patient: Emily Hayes           Date of Birth: Jul 11, 1951           MRN: [id number] Visit Date: 11/03/2022              Requested by: [name], NP [place],  [place] [id number] PCP: [name], NP   Assessment & Plan: Visit Diagnoses:  1. Pain of right hand     Plan: Impression is right hand ganglion cyst to the flexor tendon sheath of the A1 pulley of the long finger.  Disease process reviewed and treatment options were explained and she would like to try aspiration.  This was done in the office under sterile conditions.  The cyst was decompressed and there was return of typical cystic fluid.  Bandage applied.  Follow-up as needed.  Follow-Up Instructions: No follow-ups on file.   Orders:  Orders Placed This Encounter  Procedures   Hand/UE Inj   XR Hand Complete Right   No orders of the defined types were placed in this encounter.     Procedures: Hand/UE Inj on 11/03/2022 4:32 PM Details: 18 G needle, volar approach Aspirate: 1 mL Outcome: tolerated well, no immediate complications      Clinical Data: No additional findings.   Subjective: Chief Complaint  Patient presents with   Right Hand - Pain    HPI patient is a pleasant 72 year old right-hand-dominant female who comes in today with a nodule to the palm of her right hand.  She noticed this about a year ago.  She does remember falling on an outstretched hand about 4 years ago but other than that she cannot recall any injury.  She notes that her symptoms have increased over the past 2 to 3 weeks.  She is not only having pain but has noticed an increase size in the nodule.  She denies any triggering.  The location is primarily to the flexor tendon sheath of the A1 pulley of the long finger.  Symptoms are worse with applying any pressure to the area such as when she is baking or vacuuming.  She does not take medication for this.  No paresthesias.  No fevers  or chills or any other systemic symptoms.  Review of Systems as detailed in HPI.  All others reviewed and are negative.   Objective: Vital Signs: There were no vitals taken for this visit.  Physical Exam well-developed well-nourished female no acute distress.  Alert and oriented x 3.  Ortho Exam right hand exam shows a tender pea-sized nodule at the A1 pulley of the long finger.  This is tender.  No skin changes.  No triggering of the long finger.  She is neurovascular intact distally.  Specialty Comments:  No specialty comments available.  Imaging: No results found.   PMFS History: Patient Active Problem List   Diagnosis Date Noted   Pain of right hand 10/21/2022   Abnormal mammogram of right breast 02/10/2019   Allergic rhinitis 12/30/2017   Osteopenia 12/30/2017   Osteoarthritis of multiple joints 12/30/2017   Mixed hyperlipidemia 12/30/2017   Past Medical History:  Diagnosis Date   Cervical radiculopathy    Fatigue    Generalized osteoarthrosis    HTN (hypertension)    Hyperlipidemia    Lateral epicondylitis of left elbow 06/28/2017   Left knee pain    Menopause    Neck pain    Pneumonia  Pre-diabetes    Sciatica    Swelling of left knee joint    Uterine cancer ([facility])     Family History  Problem Relation Age of Onset   Stroke Mother    Diabetes Mother    Dementia Mother    Alzheimer's disease Mother    Colon polyps Brother    Hypertension Brother    Hyperlipidemia Brother    Cancer Sister        unknown   Cancer Sister        unknown   Kidney failure Brother    Cancer Sister        unknown   Lung cancer Brother    Cancer Sister        unknown   Colon cancer Neg Hx    Esophageal cancer Neg Hx    Rectal cancer Neg Hx    Stomach cancer Neg Hx     Past Surgical History:  Procedure Laterality Date   ABCESS DRAINAGE Left 09/15/2017   behind tonsil   APPENDECTOMY     VAGINAL HYSTERECTOMY  1983   Social History   Occupational History    Occupation: retired  Tobacco Use   Smoking status: Every Day    Packs/day: 0.50    Years: 50.00    Total pack years: 25.00    Types: Cigarettes   Smokeless tobacco: Never  Vaping Use   Vaping Use: Never used  Substance and Sexual Activity   Alcohol use: No   Drug use: No   Sexual activity: Not on file

## 2022-11-10 DIAGNOSIS — H43393 Other vitreous opacities, bilateral: Secondary | ICD-10-CM | POA: Diagnosis not present

## 2022-11-10 LAB — HM DIABETES EYE EXAM

## 2022-11-20 ENCOUNTER — Ambulatory Visit: Payer: Medicare HMO | Admitting: Nurse Practitioner

## 2022-12-25 ENCOUNTER — Encounter: Payer: Self-pay | Admitting: Nurse Practitioner

## 2022-12-28 ENCOUNTER — Encounter: Payer: Self-pay | Admitting: Nurse Practitioner

## 2022-12-28 ENCOUNTER — Ambulatory Visit (INDEPENDENT_AMBULATORY_CARE_PROVIDER_SITE_OTHER): Payer: Medicare HMO | Admitting: Nurse Practitioner

## 2022-12-28 VITALS — BP 128/72 | HR 76 | Temp 97.3°F | Ht 66.0 in | Wt 135.0 lb

## 2022-12-28 DIAGNOSIS — E1169 Type 2 diabetes mellitus with other specified complication: Secondary | ICD-10-CM

## 2022-12-28 DIAGNOSIS — E782 Mixed hyperlipidemia: Secondary | ICD-10-CM | POA: Diagnosis not present

## 2022-12-28 DIAGNOSIS — I10 Essential (primary) hypertension: Secondary | ICD-10-CM | POA: Diagnosis not present

## 2022-12-28 DIAGNOSIS — E785 Hyperlipidemia, unspecified: Secondary | ICD-10-CM | POA: Diagnosis not present

## 2022-12-28 DIAGNOSIS — M67441 Ganglion, right hand: Secondary | ICD-10-CM

## 2022-12-28 MED ORDER — ATORVASTATIN CALCIUM 20 MG PO TABS: 20.0000 mg | ORAL_TABLET | Freq: Every day | ORAL | 1 refills | Status: DC

## 2022-12-28 NOTE — Progress Notes (Signed)
Careteam: Patient Care Team: [name], NP as PCP - General (Geriatric Medicine) Laurence Aly, OD (Optometry)  PLACE OF SERVICE:  [place] Directive information Does Patient Have a Medical Advance Directive?: Yes, Type of Advance Directive: Out of facility DNR (pink MOST or yellow form), Pre-existing out of facility DNR order (yellow form or pink MOST form): Yellow form placed in chart (order not valid for inpatient use), Does patient want to make changes to medical advance directive?: No - Patient declined  No Known Allergies  Chief Complaint  Patient presents with   Medical Management of Chronic Issues    6 month follow-up and foot exam. Discuss need for eye exam(per patient had with Dr.Jones with [facility] care), diabetic kidney evaluation, lung cancer screening, and td/tdap or post pone if patient refuses or is not a candidate      HPI: Patient is a 72 y.o. female here for routine follow-up.  She has seen the orthopedist and had a ganglion cyst drained. This was bothering her at the last visit but is much better now.   She has been cutting out her decaf coffee and drinking dandelion tea. She says she has been reading about a lot of the benefits of the dandelion such as its beneficial effects on blood pressure, cholesterol, and blood sugar. She has also been avoiding processed sugar and has mainly agave nectar and honey, except in her baking.   She does eat some starches but tries to eat high fiber bread, and understands that she should reduce eating rice and other white starches.   She does walk every day and she lives on the 2nd floor so does a lot of stairs daily. 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   She had an eye exam recently.   Declines lung cancer screening.   Educated about staying updated with DTaP vaccine.   Talked about foot care, development of neuropathy and diabetes today.  Review of Systems:  Review of Systems   Constitutional:  Negative for chills, fever, malaise/fatigue and weight loss.  HENT:  Negative for congestion and sore throat.   Eyes:  Negative for blurred vision.  Respiratory:  Negative for cough, shortness of breath and wheezing.   Cardiovascular:  Negative for chest pain, palpitations and leg swelling.  Gastrointestinal:  Negative for abdominal pain, blood in stool, constipation, diarrhea, heartburn, nausea and vomiting.  Genitourinary:  Negative for dysuria, frequency, hematuria and urgency.  Musculoskeletal:  Negative for falls and joint pain.  Skin:  Negative for rash.  Neurological:  Negative for dizziness, tingling and headaches.  Endo/Heme/Allergies:  Negative for polydipsia.  Psychiatric/Behavioral:  Negative for depression. The patient is not nervous/anxious.     Past Medical History:  Diagnosis Date   Cervical radiculopathy    Fatigue    Generalized osteoarthrosis    HTN (hypertension)    Hyperlipidemia    Lateral epicondylitis of left elbow 06/28/2017   Left knee pain    Menopause    Neck pain    Pneumonia    Pre-diabetes    Sciatica    Swelling of left knee joint    Uterine cancer [facility])    Past Surgical History:  Procedure Laterality Date   ABCESS DRAINAGE Left 09/15/2017   behind tonsil   APPENDECTOMY     VAGINAL HYSTERECTOMY  1983   Social History:   reports that she has been smoking cigarettes. She has a 25.00 pack-year smoking history. She  has never used smokeless tobacco. She reports that she does not drink alcohol and does not use drugs.  Family History  Problem Relation Age of Onset   Stroke Mother    Diabetes Mother    Dementia Mother    Alzheimer's disease Mother    Colon polyps Brother    Hypertension Brother    Hyperlipidemia Brother    Cancer Sister        unknown   Cancer Sister        unknown   Kidney failure Brother    Cancer Sister        unknown   Lung cancer Brother    Cancer Sister        unknown   Colon cancer Neg Hx     Esophageal cancer Neg Hx    Rectal cancer Neg Hx    Stomach cancer Neg Hx     Medications: Patient's Medications  New Prescriptions   No medications on file  Previous Medications   ACETAMINOPHEN (TYLENOL) 500 MG TABLET    Take 500 mg by mouth daily as needed.   CALCIUM CITRATE-VITAMIN D (CALCIUM CITRATE + D PO)    Take 1 tablet by mouth 2 (two) times daily.   CHOLECALCIFEROL (VITAMIN D-3) 25 MCG (1000 UT) CAPS    Take 1 capsule by mouth in the morning and at bedtime.   CYCLOBENZAPRINE (FLEXERIL) 10 MG TABLET    Take 1 tablet (10 mg total) by mouth 2 (two) times daily as needed for muscle spasms.   DIPHENHYDRAMINE (BENADRYL) 25 MG TABLET    Take 25 mg by mouth as needed.    FLUTICASONE (FLONASE) 50 MCG/ACT NASAL SPRAY    Place 1 spray into both nostrils as needed for allergies or rhinitis.   LIDOCAINE 4 %    Place 1 patch onto the skin as needed.   LOSARTAN (COZAAR) 25 MG TABLET    Take 1 tablet (25 mg total) by mouth daily.   MISC NATURAL PRODUCTS (COLON CLEANSE) CAPS    Take by mouth. As needed   SIMETHICONE (GAS RELIEF EXTRA STRENGTH PO)    Take 1 capsule by mouth as needed.   VITAMIN E 400 UNIT CAPSULE    Take 1,200 Units by mouth daily.   Modified Medications   Modified Medication Previous Medication   ATORVASTATIN (LIPITOR) 20 MG TABLET atorvastatin (LIPITOR) 20 MG tablet      Take 1 tablet (20 mg total) by mouth daily.    Take 20 mg by mouth daily.  Discontinued Medications   ASPIRIN EC 81 MG TABLET    Take 1 tablet (81 mg total) by mouth daily. Swallow whole.   ATORVASTATIN (LIPITOR) 10 MG TABLET    Take 1 tablet (10 mg total) by mouth daily.    Physical Exam:  Vitals:   12/28/22 0844  BP: 128/72  Pulse: 76  Temp: (!) 97.3 F (36.3 C)  TempSrc: Temporal  SpO2: 98%  Weight: 135 lb (61.2 kg)  Height: '5\' 6"'$  (1.676 m)   Body mass index is 21.79 kg/m. Wt Readings from Last 3 Encounters:  12/28/22 135 lb (61.2 kg)  10/20/22 134 lb (60.8 kg)  06/29/22 132 lb (59.9  kg)    Physical Exam Vitals reviewed.  Constitutional:      General: She is not in acute distress.    Appearance: Normal appearance.  Cardiovascular:     Rate and Rhythm: Normal rate and regular rhythm.  Pulmonary:     Effort: No  respiratory distress.     Breath sounds: Normal breath sounds.  Abdominal:     General: Bowel sounds are normal. There is no distension.     Palpations: Abdomen is soft. There is no mass.     Tenderness: There is no abdominal tenderness. There is no guarding.  Musculoskeletal:     Cervical back: Neck supple.  Lymphadenopathy:     Cervical: No cervical adenopathy.  Skin:    General: Skin is warm and dry.  Neurological:     Mental Status: She is alert and oriented to person, place, and time.  Psychiatric:        Mood and Affect: Mood normal.     Labs reviewed: Basic Metabolic Panel: Recent Labs    06/29/22 0914  NA 139  K 4.6  CL 103  CO2 25  GLUCOSE 108*  BUN 15  CREATININE 0.80  CALCIUM 9.7   Liver Function Tests: Recent Labs    06/29/22 0914  AST 17  ALT 10  BILITOT 0.4  PROT 7.2   No results for input(s): "LIPASE", "AMYLASE" in the last 8760 hours. No results for input(s): "AMMONIA" in the last 8760 hours. CBC: Recent Labs    06/29/22 0914  WBC 6.2  NEUTROABS 2,877  HGB 11.9  HCT 36.3  MCV 80.5  PLT 259   Lipid Panel: Recent Labs    06/29/22 0914  CHOL 205*  HDL 66  LDLCALC 120*  TRIG 93  CHOLHDL 3.1   TSH: No results for input(s): "TSH" in the last 8760 hours. A1C: Lab Results  Component Value Date   HGBA1C 6.5 (H) 06/29/2022     Assessment/Plan 1. Type 2 diabetes mellitus with hyperlipidemia (HCC) Check A1C today. Patient is aware of diet changes she needs to make and is working towards this. Encouraged her to increase level of physical activity. Currently not on diabetes medication. - Hemoglobin A1c -education provided about routine foot care/monitoring and to keep up with diabetic eye exams  through ophthalmology   2. Mixed hyperlipidemia Increased lipitor dose at last visit. Check lipid panel today. Avoid fried and fatty foods. Pt reports she does avoid fried foods but does utilize a lot of butter; she knows she should cut back. - atorvastatin (LIPITOR) 20 MG tablet; Take 1 tablet (20 mg total) by mouth daily.  Dispense: 90 tablet; Refill: 1 - Lipid panel  3. Primary hypertension BP looks great today. Patient does not eat a lot of salty foods/add salt to foods. Continue losartan with dietary modifications. - COMPLETE METABOLIC PANEL WITH GFR - CBC with Differential/Platelet  4. Ganglion cyst of finger of right hand -aspiration completed by ortho. Doing well at this time   Return in about 6 months (around 06/30/2023) for routine follow up .  Student- Archer Asa O'Berry ACPCNP-S  I personally was present during the history, physical exam and medical decision-making activities of this service and have verified that the service and findings are accurately documented in the student's note Fidelia Cathers K. [place], [facility] [phone]

## 2022-12-29 LAB — CBC WITH DIFFERENTIAL/PLATELET
Absolute Monocytes: 588 cells/uL (ref 200–950)
Basophils Absolute: 72 cells/uL (ref 0–200)
Basophils Relative: 1.2 %
Eosinophils Absolute: 120 cells/uL (ref 15–500)
Eosinophils Relative: 2 %
HCT: 41.4 % (ref 35.0–45.0)
Hemoglobin: 13.2 g/dL (ref 11.7–15.5)
Lymphs Abs: 2742 cells/uL (ref 850–3900)
MCH: 26.6 pg — ABNORMAL LOW (ref 27.0–33.0)
MCHC: 31.9 g/dL — ABNORMAL LOW (ref 32.0–36.0)
MCV: 83.3 fL (ref 80.0–100.0)
MPV: 11.7 fL (ref 7.5–12.5)
Monocytes Relative: 9.8 %
Neutro Abs: 2478 cells/uL (ref 1500–7800)
Neutrophils Relative %: 41.3 %
Platelets: 285 10*3/uL (ref 140–400)
RBC: 4.97 10*6/uL (ref 3.80–5.10)
RDW: 14.3 % (ref 11.0–15.0)
Total Lymphocyte: 45.7 %
WBC: 6 10*3/uL (ref 3.8–10.8)

## 2022-12-29 LAB — COMPLETE METABOLIC PANEL WITH GFR
AG Ratio: 2 (calc) (ref 1.0–2.5)
ALT: 11 U/L (ref 6–29)
AST: 17 U/L (ref 10–35)
Albumin: 4.7 g/dL (ref 3.6–5.1)
Alkaline phosphatase (APISO): 64 U/L (ref 37–153)
BUN: 18 mg/dL (ref 7–25)
CO2: 31 mmol/L (ref 20–32)
Calcium: 9.9 mg/dL (ref 8.6–10.4)
Chloride: 103 mmol/L (ref 98–110)
Creat: 0.84 mg/dL (ref 0.60–1.00)
Globulin: 2.3 g/dL (calc) (ref 1.9–3.7)
Glucose, Bld: 97 mg/dL (ref 65–99)
Potassium: 5 mmol/L (ref 3.5–5.3)
Sodium: 141 mmol/L (ref 135–146)
Total Bilirubin: 0.4 mg/dL (ref 0.2–1.2)
Total Protein: 7 g/dL (ref 6.1–8.1)
eGFR: 73 mL/min/{1.73_m2} (ref >=60)

## 2022-12-29 LAB — LIPID PANEL
Cholesterol: 210 mg/dL — ABNORMAL HIGH (ref <200)
HDL: 66 mg/dL (ref >=50)
LDL Cholesterol (Calc): 124 mg/dL (calc) — ABNORMAL HIGH
Non-HDL Cholesterol (Calc): 144 mg/dL (calc) — ABNORMAL HIGH (ref <130)
Total CHOL/HDL Ratio: 3.2 (calc) (ref <5.0)
Triglycerides: 102 mg/dL (ref <150)

## 2022-12-29 LAB — HEMOGLOBIN A1C
Hgb A1c MFr Bld: 7 % of total Hgb — ABNORMAL HIGH (ref <5.7)
Mean Plasma Glucose: 154 mg/dL
eAG (mmol/L): 8.5 mmol/L

## 2023-03-21 ENCOUNTER — Other Ambulatory Visit: Payer: Self-pay | Admitting: Nurse Practitioner

## 2023-03-21 DIAGNOSIS — E782 Mixed hyperlipidemia: Secondary | ICD-10-CM

## 2023-05-19 ENCOUNTER — Ambulatory Visit: Payer: Medicare HMO | Admitting: Orthopaedic Surgery

## 2023-05-19 ENCOUNTER — Encounter: Payer: Self-pay | Admitting: Orthopaedic Surgery

## 2023-05-19 ENCOUNTER — Other Ambulatory Visit (INDEPENDENT_AMBULATORY_CARE_PROVIDER_SITE_OTHER): Payer: Medicare HMO

## 2023-05-19 DIAGNOSIS — M65311 Trigger thumb, right thumb: Secondary | ICD-10-CM

## 2023-05-19 MED ORDER — BUPIVACAINE HCL 0.25 % IJ SOLN
0.3300 mL | INTRAMUSCULAR | Status: AC | PRN
  Administered 2023-05-19: .33 mL

## 2023-05-19 MED ORDER — LIDOCAINE HCL 1 % IJ SOLN
1.0000 mL | INTRAMUSCULAR | Status: AC | PRN
  Administered 2023-05-19: 1 mL

## 2023-05-19 MED ORDER — METHYLPREDNISOLONE ACETATE 40 MG/ML IJ SUSP
13.3300 mg | INTRAMUSCULAR | Status: AC | PRN
  Administered 2023-05-19: 13.33 mg

## 2023-05-19 NOTE — Addendum Note (Signed)
Addended by: Mayra Reel on: 05/19/2023 12:47 PM   Modules accepted: Level of Service

## 2023-05-19 NOTE — Progress Notes (Addendum)
Office Visit Note   Patient: Emily Hayes           Date of Birth: [birth year]           MRN: [id number] Visit Date: 05/19/2023              Requested by: Sharon Seller, NP [address] [place]. [place],  [place] [address] PCP: Sharon Seller, NP   Assessment & Plan: Visit Diagnoses:  1. Trigger thumb, right thumb     Plan: Impression is right trigger thumb.  We discussed various treatment options to include cortisone injection and night splinting for a week for which she would like to proceed.  She will follow-up with [place] if her symptoms do not improve or if they return.  Call with concerns or questions.  Follow-Up Instructions: Return if symptoms worsen or fail to improve.   Orders:  Orders Placed This Encounter  Procedures   Hand/UE Inj: R thumb A1   XR Finger Thumb Right   Meds ordered this encounter  Medications   bupivacaine (MARCAINE) 0.25 % (with pres) injection 0.33 mL   lidocaine (XYLOCAINE) 1 % (with pres) injection 1 mL   methylPREDNISolone acetate (DEPO-MEDROL) injection 13.33 mg      Procedures: Hand/UE Inj: R thumb A1 for trigger finger on 05/19/2023 8:52 AM Indications: pain Details: 25 G needle Medications: 1 mL lidocaine 1 %; 0.33 mL bupivacaine 0.25 %; 13.33 mg methylPREDNISolone acetate 40 MG/ML      Clinical Data: No additional findings.   Subjective: Chief Complaint  Patient presents with   Right Hand - Pain    thumb    HPI patient is a pleasant 72 year old right-hand-dominant female who comes in today with pain and triggering to the right thumb.  She denies any injury or change in activity.  Symptoms have been ongoing for the past 3 weeks and appear to be worse in the morning or with certain movements of the thumb such as when she is grasping.  She has been taking Tylenol arthritis and using analgesic rubs without significant relief.  She is a borderline diabetic.  Review of Systems as detailed in HPI.  All others reviewed and  are negative.   Objective: Vital Signs: There were no vitals taken for this visit.  Physical Exam well-developed well-nourished female no acute distress.  Alert and oriented x 3.  Ortho Exam right hand exam reveals marked tenderness as well as palpable nodule at the A1 pulley of the thumb.  She does have reproducible triggering.  No tenderness to the [facility] joint.  No pain or crepitus with grind test.  She is neurovascular intact distally.  Specialty Comments:  No specialty comments available.  Imaging: XR Finger Thumb Right  Result Date: 05/19/2023 No acute or structural abnormalities    PMFS History: Patient Active Problem List   Diagnosis Date Noted   Pain of right hand 10/21/2022   Abnormal mammogram of right breast 02/10/2019   Allergic rhinitis 12/30/2017   Osteopenia 12/30/2017   Osteoarthritis of multiple joints 12/30/2017   Mixed hyperlipidemia 12/30/2017   Past Medical History:  Diagnosis Date   Cervical radiculopathy    Fatigue    Generalized osteoarthrosis    HTN (hypertension)    Hyperlipidemia    Lateral epicondylitis of left elbow 06/28/2017   Left knee pain    Menopause    Neck pain    Pneumonia    Pre-diabetes    Sciatica    Swelling of left  knee joint    Uterine cancer (HCC)     Family History  Problem Relation Age of Onset   Stroke Mother    Diabetes Mother    Dementia Mother    Alzheimer's disease Mother    Colon polyps Brother    Hypertension Brother    Hyperlipidemia Brother    Cancer Sister        unknown   Cancer Sister        unknown   Kidney failure Brother    Cancer Sister        unknown   Lung cancer Brother    Cancer Sister        unknown   Colon cancer Neg Hx    Esophageal cancer Neg Hx    Rectal cancer Neg Hx    Stomach cancer Neg Hx     Past Surgical History:  Procedure Laterality Date   ABCESS DRAINAGE Left 09/15/2017   behind tonsil   APPENDECTOMY     VAGINAL HYSTERECTOMY  1983   Social History    Occupational History   Occupation: retired  Tobacco Use   Smoking status: Every Day    Current packs/day: 0.50    Average packs/day: 0.5 packs/day for 50.0 years (25.0 ttl pk-yrs)    Types: Cigarettes   Smokeless tobacco: Never  Vaping Use   Vaping status: Never Used  Substance and Sexual Activity   Alcohol use: No   Drug use: No   Sexual activity: Not on file

## 2023-06-18 ENCOUNTER — Other Ambulatory Visit: Payer: Self-pay | Admitting: Nurse Practitioner

## 2023-06-18 DIAGNOSIS — Z1231 Encounter for screening mammogram for malignant neoplasm of breast: Secondary | ICD-10-CM

## 2023-06-25 ENCOUNTER — Telehealth: Payer: Self-pay

## 2023-06-25 NOTE — Telephone Encounter (Signed)
We can do blood work because she is due- she also need routine follow up scheduled which we can do at appt  or you can make with her

## 2023-06-25 NOTE — Telephone Encounter (Signed)
I spoke with patient and informed her that blood owrk could be done on her upcoming appointment and that she needs to schedule a routine follow up. Patient stated that she will wait until Monday to schedule routine appointment

## 2023-06-25 NOTE — Telephone Encounter (Signed)
Patient called regarding AWV on Monday 9/9. She would like to know if blood work will need to be done.

## 2023-06-28 ENCOUNTER — Encounter: Payer: Self-pay | Admitting: Nurse Practitioner

## 2023-06-28 ENCOUNTER — Ambulatory Visit (INDEPENDENT_AMBULATORY_CARE_PROVIDER_SITE_OTHER): Payer: Medicare HMO | Admitting: Nurse Practitioner

## 2023-06-28 VITALS — BP 134/86 | HR 86 | Temp 97.1°F | Ht 66.5 in | Wt 135.0 lb

## 2023-06-28 DIAGNOSIS — E782 Mixed hyperlipidemia: Secondary | ICD-10-CM

## 2023-06-28 DIAGNOSIS — E785 Hyperlipidemia, unspecified: Secondary | ICD-10-CM | POA: Diagnosis not present

## 2023-06-28 DIAGNOSIS — I10 Essential (primary) hypertension: Secondary | ICD-10-CM | POA: Diagnosis not present

## 2023-06-28 DIAGNOSIS — E1169 Type 2 diabetes mellitus with other specified complication: Secondary | ICD-10-CM

## 2023-06-28 DIAGNOSIS — Z0000 Encounter for general adult medical examination without abnormal findings: Secondary | ICD-10-CM | POA: Diagnosis not present

## 2023-06-28 MED ORDER — ATORVASTATIN CALCIUM 20 MG PO TABS: 20.0000 mg | ORAL_TABLET | Freq: Every day | ORAL | 3 refills | Status: DC

## 2023-06-28 MED ORDER — LOSARTAN POTASSIUM 25 MG PO TABS: 25.0000 mg | ORAL_TABLET | Freq: Every day | ORAL | 3 refills | Status: DC

## 2023-06-28 NOTE — Progress Notes (Signed)
Subjective:   Emily Hayes is a 72 y.o. female who presents for Medicare Annual (Subsequent) preventive examination.  Visit Complete: [facility] at [facility]   Review of Systems     Cardiac Risk Factors include: advanced age (>64men, >29 women);diabetes mellitus;hypertension;dyslipidemia     Objective:    Today's Vitals   06/28/23 0837 06/28/23 0905 06/28/23 0929  BP: (!) 136/90 134/86   Pulse: 86    Temp: (!) 97.1 F (36.2 C)    TempSrc: Temporal    SpO2: 99%    Weight: 135 lb (61.2 kg)    Height: 5' 6.5" (1.689 m)    PainSc: 0-No pain  0-No pain   Body mass index is 21.46 kg/m.'  Wt Readings from Last 3 Encounters:  06/28/23 135 lb (61.2 kg)  12/28/22 135 lb (61.2 kg)  10/20/22 134 lb (60.8 kg)        06/28/2023    8:57 AM 12/28/2022    8:45 AM 06/29/2022    8:42 AM 06/16/2022   10:12 AM 05/15/2022   10:52 AM 05/05/2022    2:39 PM 08/01/2021   11:41 AM  Advanced Directives  Does Patient Have a Medical Advance Directive? Yes Yes Yes Yes Yes Yes Yes  Type of Advance Directive Out of facility DNR (pink MOST or yellow form) Out of facility DNR (pink MOST or yellow form) Healthcare Power of [place];Living will Healthcare Power of [place];Living will Healthcare Power of [place];Living will Healthcare Power of [place];Living will;Out of facility DNR (pink MOST or yellow form) Living will  Does patient want to make changes to medical advance directive? No - Patient declined No - Patient declined  No - Patient declined  No - Patient declined No - Patient declined  Copy of Healthcare Power of Attorney in Chart?   No - copy requested No - copy requested  No - copy requested   Pre-existing out of facility DNR order (yellow form or pink MOST form)  Yellow form placed in chart (order not valid for inpatient use)         Current Medications (verified) Outpatient Encounter Medications as of 06/28/2023  Medication Sig   acetaminophen (TYLENOL) 500 MG tablet Take 500 mg by mouth  daily as needed.   Calcium Citrate-Vitamin D (CALCIUM CITRATE + D PO) Take 1 tablet by mouth 2 (two) times daily.   Cholecalciferol (VITAMIN D-3) 25 MCG (1000 UT) CAPS Take 1 capsule by mouth in the morning and at bedtime.   cyclobenzaprine (FLEXERIL) 10 MG tablet Take 1 tablet (10 mg total) by mouth 2 (two) times daily as needed for muscle spasms.   diphenhydrAMINE (BENADRYL) 25 MG tablet Take 25 mg by mouth as needed.    fluticasone (FLONASE) 50 MCG/ACT nasal spray Place 1 spray into both nostrils as needed for allergies or rhinitis.   Misc Natural Products (COLON CLEANSE) CAPS Take by mouth. As needed   Simethicone (GAS RELIEF EXTRA STRENGTH PO) Take 1 capsule by mouth as needed.   vitamin E 400 UNIT capsule Take 1,200 Units by mouth daily.    [DISCONTINUED] atorvastatin (LIPITOR) 20 MG tablet Take 1 tablet (20 mg total) by mouth daily.   [DISCONTINUED] losartan (COZAAR) 25 MG tablet Take 1 tablet (25 mg total) by mouth daily.   atorvastatin (LIPITOR) 20 MG tablet Take 1 tablet (20 mg total) by mouth daily.   losartan (COZAAR) 25 MG tablet Take 1 tablet (25 mg total) by mouth daily.   [DISCONTINUED] lidocaine 4 % Place  1 patch onto the skin as needed.   No facility-administered encounter medications on file as of 06/28/2023.    Allergies (verified) Patient has no known allergies.   History: Past Medical History:  Diagnosis Date   Cervical radiculopathy    Fatigue    Generalized osteoarthrosis    HTN (hypertension)    Hyperlipidemia    Lateral epicondylitis of left elbow 06/28/2017   Left knee pain    Menopause    Neck pain    Pneumonia    Pre-diabetes    Sciatica    Swelling of left knee joint    Uterine cancer [facility])    Past Surgical History:  Procedure Laterality Date   ABCESS DRAINAGE Left 09/15/2017   behind tonsil   APPENDECTOMY     VAGINAL HYSTERECTOMY  1983   Family History  Problem Relation Age of Onset   Stroke Mother    Diabetes Mother    Dementia Mother     Alzheimer's disease Mother    Colon polyps Brother    Hypertension Brother    Hyperlipidemia Brother    Cancer Sister        unknown   Cancer Sister        unknown   Kidney failure Brother    Cancer Sister        unknown   Lung cancer Brother    Cancer Sister        unknown   Colon cancer Neg Hx    Esophageal cancer Neg Hx    Rectal cancer Neg Hx    Stomach cancer Neg Hx    Social History   Socioeconomic History   Marital status: Divorced    Spouse name: Not on file   Number of children: 2   Years of education: Not on file   Highest education level: Not on file  Occupational History   Occupation: retired  Tobacco Use   Smoking status: Every Day    Current packs/day: 0.50    Average packs/day: 0.5 packs/day for 50.0 years (25.0 ttl pk-yrs)    Types: Cigarettes   Smokeless tobacco: Never  Vaping Use   Vaping status: Never Used  Substance and Sexual Activity   Alcohol use: No   Drug use: No   Sexual activity: Not on file  Other Topics Concern   Not on file  Social History Narrative   Social History      Diet? none      Do you drink/eat things with caffeine? Chocolate- occasionally      Marital status?        divorced                            What year were you married? 1982      Do you live in a house, apartment, assisted living, condo, trailer, etc.? apartment      Is it one or more stories? 1      How many persons live in your home? 1       Do you have any pets in your home? (please list) yes, dog      Highest level of education completed? Associate degree- college      Current or past profession: retired      Do you exercise?           yes  Type & how often? Walk- daily      Advanced Directives      Do you have a living will? yes      Do you have a DNR form?     yes                             If not, do you want to discuss one? yes      Do you have signed POA/HPOA for forms? no      Functional Status      Do  you have difficulty bathing or dressing yourself?      Do you have difficulty preparing food or eating?       Do you have difficulty managing your medications?      Do you have difficulty managing your finances?      Do you have difficulty affording your medications?   Social Determinants of Health   Financial Resource Strain: Low Risk  (01/05/2018)   Overall Financial Resource Strain (CARDIA)    Difficulty of Paying Living Expenses: Not hard at all  Food Insecurity: No Food Insecurity (01/05/2018)   Hunger Vital Sign    Worried About Running Out of Food in the Last Year: Never true    Ran Out of Food in the Last Year: Never true  Transportation Needs: No Transportation Needs (01/05/2018)   PRAPARE - Administrator, Civil Service (Medical): No    Lack of Transportation (Non-Medical): No  Physical Activity: Insufficiently Active (01/05/2018)   Exercise Vital Sign    Days of Exercise per Week: 3 days    Minutes of Exercise per Session: 20 min  Stress: No Stress Concern Present (01/05/2018)   [employer] of Occupational Health - Occupational Stress Questionnaire    Feeling of Stress : Only a little  Social Connections: Unknown (01/05/2018)   Social Connection and Isolation Panel [NHANES]    Frequency of Communication with Friends and Family: More than three times a week    Frequency of Social Gatherings with Friends and Family: Once a week    Attends Religious Services: Not on Marketing executive or Organizations: No    Attends Banker Meetings: Never    Marital Status: Divorced    Tobacco Counseling Ready to quit: Not Answered Counseling given: Not Answered   Clinical Intake:  Pre-visit preparation completed: Yes  Pain : No/denies pain Pain Score: 0-No pain     BMI - recorded: 21.46 Nutritional Status: BMI of 19-24  Normal Diabetes: No  How often do you need to have someone help you when you read instructions, pamphlets, or  other written materials from your doctor or pharmacy?: 1 - Never         Activities of Daily Living    06/28/2023    9:28 AM  In your present state of health, do you have any difficulty performing the following activities:  Hearing? 0  Vision? 0  Difficulty concentrating or making decisions? 0  Walking or climbing stairs? 0  Dressing or bathing? 0  Doing errands, shopping? 0  Preparing Food and eating ? N  Using the Toilet? N  In the past six months, have you accidently leaked urine? N  Do you have problems with loss of bowel control? N  Managing your Medications? N  Managing your Finances? N  Housekeeping or managing your Housekeeping? N    Patient  Care Team: Sharon Seller, NP as PCP - General (Geriatric Medicine) Ander Purpura, OD (Optometry)  Indicate any recent Medical Services you may have received from other than [facility] providers in the past year (date may be approximate).     Assessment:   This is a routine wellness examination for [place].  Hearing/Vision screen Hearing Screening - Comments:: No hearing issues  Vision Screening - Comments:: Last eye exam less than 12 months ago.    Goals Addressed   None    Depression Screen    06/28/2023    8:56 AM 12/28/2022    8:40 AM 06/29/2022    8:40 AM 06/16/2022   10:09 AM 06/12/2021    1:50 PM 06/04/2021   10:18 AM 09/06/2020   11:44 AM  PHQ 2/9 Scores  PHQ - 2 Score 0 0 0 0 0 0 0    Fall Risk    06/28/2023    8:56 AM 12/28/2022    8:40 AM 06/29/2022    8:40 AM 06/16/2022   10:09 AM 05/05/2022    2:39 PM  Fall Risk   Falls in the past year? 0 0 0 0 0  Number falls in past yr: 0 0 0 0 0  Injury with Fall? 0 0 0 0 0  Risk for fall due to : No Fall Risks No Fall Risks No Fall Risks No Fall Risks No Fall Risks  Follow up Falls evaluation completed Falls evaluation completed Falls evaluation completed Falls evaluation completed Falls evaluation completed    MEDICARE RISK AT HOME:    TIMED UP AND GO:  Was  the test performed?  No    Cognitive Function:    06/28/2023    9:09 AM 01/05/2018    8:45 AM  MMSE - Mini Mental State Exam  Orientation to time 4 5  Orientation to Place 5 5  Registration 3 3  Attention/ Calculation 5 5  Recall 1 3  Language- name 2 objects 2 2  Language- repeat 1 1  Language- follow 3 step command 3 3  Language- read & follow direction 1 1  Write a sentence 1 1  Copy design 1 1  Total score 27 30        06/16/2022   10:10 AM 06/12/2021    1:52 PM 02/13/2020    2:02 PM 02/10/2019    9:49 AM  6CIT Screen  What Year? 0 points 0 points 0 points 0 points  What month? 0 points 0 points 0 points 0 points  What time? 0 points 0 points 0 points 0 points  Count back from 20 0 points 0 points 0 points 0 points  Months in reverse 0 points 0 points 0 points 0 points  Repeat phrase 0 points 0 points 0 points 2 points  Total Score 0 points 0 points 0 points 2 points    Immunizations Immunization History  Administered Date(s) Administered   Covid-19, Mrna,Vaccine(Spikevax)18yrs and older 09/27/2022   Moderna Covid-19 Vaccine Bivalent Booster 77yrs & up 06/25/2021   Moderna SARS-COV2 Booster Vaccination 01/28/2021   Moderna Sars-Covid-2 Vaccination 12/01/2019, 12/29/2019, 08/17/2020   Tdap 02/05/2011   Zoster Recombinant(Shingrix) 03/19/2017, 07/19/2017    TDAP status: Due, Education has been provided regarding the importance of this vaccine. Advised may receive this vaccine at local pharmacy or Health Dept. Aware to provide a copy of the vaccination record if obtained from local pharmacy or Health Dept. Verbalized acceptance and understanding.  Flu Vaccine status: Declined, Education has been  provided regarding the importance of this vaccine but patient still declined. Advised may receive this vaccine at local pharmacy or Health Dept. Aware to provide a copy of the vaccination record if obtained from local pharmacy or Health Dept. Verbalized acceptance and  understanding.  Pneumococcal vaccine status: Declined,  Education has been provided regarding the importance of this vaccine but patient still declined. Advised may receive this vaccine at local pharmacy or Health Dept. Aware to provide a copy of the vaccination record if obtained from local pharmacy or Health Dept. Verbalized acceptance and understanding.   Covid-19 vaccine status: Information provided on how to obtain vaccines.   Qualifies for Shingles Vaccine? Yes   Zostavax completed Yes   Shingrix Completed?: Yes  Screening Tests Health Maintenance  Topic Date Due   Diabetic kidney evaluation - Urine ACR  Never done   DTaP/Tdap/Td (2 - Td or Tdap) 02/04/2021   COVID-19 Vaccine (7 - 2023-24 season) 06/20/2023   Lung Cancer Screening  06/27/2024 (Originally 08/12/1999)   Pneumonia Vaccine 34+ Years old (1 of 2 - PCV) 10/19/2048 (Originally 08/12/1957)   HEMOGLOBIN A1C  06/30/2023   MAMMOGRAM  07/05/2023   OPHTHALMOLOGY EXAM  11/11/2023   DEXA SCAN  11/27/2023   Diabetic kidney evaluation - eGFR measurement  12/28/2023   FOOT EXAM  12/28/2023   Medicare Annual Wellness (AWV)  06/27/2024   Colonoscopy  01/02/2031   Hepatitis C Screening  Completed   Zoster Vaccines- Shingrix  Completed   HPV VACCINES  Aged Out   INFLUENZA VACCINE  Discontinued   Fecal DNA (Cologuard)  Discontinued    Health Maintenance  Health Maintenance Due  Topic Date Due   Diabetic kidney evaluation - Urine ACR  Never done   DTaP/Tdap/Td (2 - Td or Tdap) 02/04/2021   COVID-19 Vaccine (7 - 2023-24 season) 06/20/2023    Colorectal cancer screening: Type of screening: Colonoscopy. Completed 01/01/21. Repeat every 10 years  Mammogram status: Ordered and has appt. Pt provided with contact info and advised to call to schedule appt.   Bone Density status: Completed 11/2021. Results reflect: Bone density results: OSTEOPENIA. Repeat every 2 years.  Lung Cancer Screening: (Low Dose CT Chest recommended if Age  61-80 years, 20 pack-year currently smoking OR have quit w/in 15years.) does qualify.   Lung Cancer Screening Referral: she declines  would not want treatment   Additional Screening:  Hepatitis C Screening: does qualify; Completed   Vision Screening: Recommended annual ophthalmology exams for early detection of glaucoma and other disorders of the eye. Is the patient up to date with their annual eye exam?  Yes  Who is the provider or what is the name of the office in which the patient attends annual eye exams? Dr Yetta Barre If pt is not established with a provider, would they like to be referred to a provider to establish care? No .   Dental Screening: Recommended annual dental exams for proper oral hygiene  Diabetic Foot Exam: Diabetic Foot Exam: Completed 12/28/2023  Community Resource Referral / Chronic Care Management: CRR required this visit?  No   CCM required this visit?  No     Plan:     I have personally reviewed and noted the following in the patient's chart:   Medical and social history Use of alcohol, tobacco or illicit drugs  Current medications and supplements including opioid prescriptions. Patient is not currently taking opioid prescriptions. Functional ability and status Nutritional status Physical activity Advanced directives List of other physicians Hospitalizations,  surgeries, and ER visits in previous 12 months Vitals Screenings to include cognitive, depression, and falls Referrals and appointments  In addition, I have reviewed and discussed with patient certain preventive protocols, quality metrics, and best practice recommendations. A written personalized care plan for preventive services as well as general preventive health recommendations were provided to patient.     Sharon Seller, NP   06/28/2023

## 2023-06-28 NOTE — Patient Instructions (Addendum)
  Ms. Hipke , Thank you for taking time to come for your Medicare Wellness Visit. I appreciate your ongoing commitment to your health goals. Please review the following plan we discussed and let me know if I can assist you in the future.   These are the goals we discussed:  Goals      Quit Smoking     Would like to decrease smoking (currently smoking 1 pack in 2 days) but not quit        This is a list of the screening recommended for you and due dates:  Health Maintenance  Topic Date Due   Yearly kidney health urinalysis for diabetes  Never done   DTaP/Tdap/Td vaccine (2 - Td or Tdap) 02/04/2021   COVID-19 Vaccine (7 - 2023-24 season) 06/20/2023   Screening for Lung Cancer  06/27/2024*   Pneumonia Vaccine (1 of 2 - PCV) 10/19/2048*   Hemoglobin A1C  06/30/2023   Mammogram  07/05/2023   Eye exam for diabetics  11/11/2023   DEXA scan (bone density measurement)  11/27/2023   Yearly kidney function blood test for diabetes  12/28/2023   Complete foot exam   12/28/2023   Medicare Annual Wellness Visit  06/27/2024   Colon Cancer Screening  01/02/2031   Hepatitis C Screening  Completed   Zoster (Shingles) Vaccine  Completed   HPV Vaccine  Aged Out   Flu Shot  Discontinued   Cologuard (Stool DNA test)  Discontinued  *Topic was postponed. The date shown is not the original due date.

## 2023-06-29 LAB — CBC WITH DIFFERENTIAL/PLATELET
Absolute Monocytes: 451 {cells}/uL (ref 200–950)
Basophils Absolute: 50 {cells}/uL (ref 0–200)
Basophils Relative: 0.9 %
Eosinophils Absolute: 83 {cells}/uL (ref 15–500)
Eosinophils Relative: 1.5 %
HCT: 39.6 % (ref 35.0–45.0)
Hemoglobin: 12.5 g/dL (ref 11.7–15.5)
Lymphs Abs: 2459 {cells}/uL (ref 850–3900)
MCH: 26.5 pg — ABNORMAL LOW (ref 27.0–33.0)
MCHC: 31.6 g/dL — ABNORMAL LOW (ref 32.0–36.0)
MCV: 84.1 fL (ref 80.0–100.0)
MPV: 11.7 fL (ref 7.5–12.5)
Monocytes Relative: 8.2 %
Neutro Abs: 2459 {cells}/uL (ref 1500–7800)
Neutrophils Relative %: 44.7 %
Platelets: 267 10*3/uL (ref 140–400)
RBC: 4.71 10*6/uL (ref 3.80–5.10)
RDW: 14.1 % (ref 11.0–15.0)
Total Lymphocyte: 44.7 %
WBC: 5.5 10*3/uL (ref 3.8–10.8)

## 2023-06-29 LAB — LIPID PANEL
Cholesterol: 207 mg/dL — ABNORMAL HIGH (ref <200)
HDL: 62 mg/dL (ref >=50)
LDL Cholesterol (Calc): 123 mg/dL — ABNORMAL HIGH
Non-HDL Cholesterol (Calc): 145 mg/dL — ABNORMAL HIGH (ref <130)
Total CHOL/HDL Ratio: 3.3 (calc) (ref <5.0)
Triglycerides: 108 mg/dL (ref <150)

## 2023-06-29 LAB — COMPLETE METABOLIC PANEL WITH GFR
AG Ratio: 1.9 (calc) (ref 1.0–2.5)
ALT: 11 U/L (ref 6–29)
AST: 15 U/L (ref 10–35)
Albumin: 4.8 g/dL (ref 3.6–5.1)
Alkaline phosphatase (APISO): 65 U/L (ref 37–153)
BUN: 13 mg/dL (ref 7–25)
CO2: 29 mmol/L (ref 20–32)
Calcium: 10.3 mg/dL (ref 8.6–10.4)
Chloride: 102 mmol/L (ref 98–110)
Creat: 0.73 mg/dL (ref 0.60–1.00)
Globulin: 2.5 g/dL (ref 1.9–3.7)
Glucose, Bld: 100 mg/dL — ABNORMAL HIGH (ref 65–99)
Potassium: 4.4 mmol/L (ref 3.5–5.3)
Sodium: 139 mmol/L (ref 135–146)
Total Bilirubin: 0.4 mg/dL (ref 0.2–1.2)
Total Protein: 7.3 g/dL (ref 6.1–8.1)
eGFR: 87 mL/min/{1.73_m2} (ref >=60)

## 2023-06-29 LAB — HEMOGLOBIN A1C
Hgb A1c MFr Bld: 7.1 %{Hb} — ABNORMAL HIGH (ref <5.7)
Mean Plasma Glucose: 157 mg/dL
eAG (mmol/L): 8.7 mmol/L

## 2023-06-29 LAB — MICROALBUMIN / CREATININE URINE RATIO
Creatinine, Urine: 12 mg/dL — ABNORMAL LOW (ref 20–275)
Microalb Creat Ratio: 17 mg/g{creat} (ref <30)
Microalb, Ur: 0.2 mg/dL

## 2023-06-30 ENCOUNTER — Ambulatory Visit (INDEPENDENT_AMBULATORY_CARE_PROVIDER_SITE_OTHER): Payer: Medicare HMO | Admitting: Nurse Practitioner

## 2023-06-30 ENCOUNTER — Encounter: Payer: Self-pay | Admitting: Nurse Practitioner

## 2023-06-30 VITALS — Ht 66.5 in

## 2023-06-30 DIAGNOSIS — E782 Mixed hyperlipidemia: Secondary | ICD-10-CM

## 2023-06-30 DIAGNOSIS — E1169 Type 2 diabetes mellitus with other specified complication: Secondary | ICD-10-CM

## 2023-06-30 DIAGNOSIS — I10 Essential (primary) hypertension: Secondary | ICD-10-CM | POA: Diagnosis not present

## 2023-06-30 DIAGNOSIS — M8580 Other specified disorders of bone density and structure, unspecified site: Secondary | ICD-10-CM | POA: Diagnosis not present

## 2023-06-30 DIAGNOSIS — K4090 Unilateral inguinal hernia, without obstruction or gangrene, not specified as recurrent: Secondary | ICD-10-CM | POA: Diagnosis not present

## 2023-06-30 DIAGNOSIS — E785 Hyperlipidemia, unspecified: Secondary | ICD-10-CM

## 2023-06-30 MED ORDER — ATORVASTATIN CALCIUM 40 MG PO TABS: 40.0000 mg | ORAL_TABLET | Freq: Every day | ORAL | 1 refills | Status: DC

## 2023-06-30 NOTE — Patient Instructions (Signed)
Increase lipitor to 40 mg daily at bedtime if possible.   Continue to work on dietary modifications by cutting back on sugars, sweets and carbohydrates  Increase calcium to twice daily

## 2023-06-30 NOTE — Addendum Note (Signed)
Addended by: Sharon Seller on: 06/30/2023 01:03 PM   Modules accepted: Orders

## 2023-06-30 NOTE — Progress Notes (Signed)
Careteam: Patient Care Team: Sharon Seller, NP as PCP - General (Geriatric Medicine) Ander Purpura, OD (Optometry)  PLACE OF SERVICE:  [facility]  Advanced Directive information Does Patient Have a Medical Advance Directive?: Yes, Type of Advance Directive: Out of facility DNR (pink MOST or yellow form), Does patient want to make changes to medical advance directive?: No - Patient declined  No Known Allergies  Chief Complaint  Patient presents with   Medical Management of Chronic Issues    routine follow up-labs   Immunizations    DTAP and COVID ([facility] verified)     HPI: Patient is a 72 y.o. female for routine follow up.   DM- A1c at 7.1, not currently on medication She reports since may has cut out white pasta, bread, rice.  Sometimes buys flourless bread which is made of sprouts which she likes.  Avoids salty stuff.  She has been baking a lot recently. Loves sweet Loves mac and cheese but has not had in 4 months.   Osteopenia- on cal and vit d   Hyperlipidemia- taking lipitor.   Review of Systems:  Review of Systems  Constitutional:  Negative for chills, fever and weight loss.  HENT:  Negative for tinnitus.   Respiratory:  Negative for cough, sputum production and shortness of breath.   Cardiovascular:  Negative for chest pain, palpitations and leg swelling.  Gastrointestinal:  Negative for abdominal pain, constipation, diarrhea and heartburn.  Genitourinary:  Negative for dysuria, frequency and urgency.  Musculoskeletal:  Negative for back pain, falls, joint pain and myalgias.  Skin: Negative.   Neurological:  Negative for dizziness and headaches.  Psychiatric/Behavioral:  Negative for depression and memory loss. The patient does not have insomnia.     Past Medical History:  Diagnosis Date   Cervical radiculopathy    Fatigue    Generalized osteoarthrosis    HTN (hypertension)    Hyperlipidemia    Lateral epicondylitis of left elbow 06/28/2017   Left  knee pain    Menopause    Neck pain    Pneumonia    Pre-diabetes    Sciatica    Swelling of left knee joint    Uterine cancer [facility])    Past Surgical History:  Procedure Laterality Date   ABCESS DRAINAGE Left 09/15/2017   behind tonsil   APPENDECTOMY     VAGINAL HYSTERECTOMY  1983   Social History:   reports that she has been smoking cigarettes. She has a 25 pack-year smoking history. She has never used smokeless tobacco. She reports that she does not drink alcohol and does not use drugs.  Family History  Problem Relation Age of Onset   Stroke Mother    Diabetes Mother    Dementia Mother    Alzheimer's disease Mother    Colon polyps Brother    Hypertension Brother    Hyperlipidemia Brother    Cancer Sister        unknown   Cancer Sister        unknown   Kidney failure Brother    Cancer Sister        unknown   Lung cancer Brother    Cancer Sister        unknown   Colon cancer Neg Hx    Esophageal cancer Neg Hx    Rectal cancer Neg Hx    Stomach cancer Neg Hx     Medications: Patient's Medications  New Prescriptions   No medications on file  Previous  Medications   ACETAMINOPHEN (TYLENOL) 500 MG TABLET    Take 500 mg by mouth daily as needed.   ATORVASTATIN (LIPITOR) 20 MG TABLET    Take 1 tablet (20 mg total) by mouth daily.   CALCIUM CITRATE-VITAMIN D (CALCIUM CITRATE + D PO)    Take 1 tablet by mouth 2 (two) times daily.   CHOLECALCIFEROL (VITAMIN D-3) 25 MCG (1000 UT) CAPS    Take 1 capsule by mouth in the morning and at bedtime.   CYCLOBENZAPRINE (FLEXERIL) 10 MG TABLET    Take 1 tablet (10 mg total) by mouth 2 (two) times daily as needed for muscle spasms.   DIPHENHYDRAMINE (BENADRYL) 25 MG TABLET    Take 25 mg by mouth as needed.    FLUTICASONE (FLONASE) 50 MCG/ACT NASAL SPRAY    Place 1 spray into both nostrils as needed for allergies or rhinitis.   LOSARTAN (COZAAR) 25 MG TABLET    Take 1 tablet (25 mg total) by mouth daily.   MISC NATURAL PRODUCTS (COLON  CLEANSE) CAPS    Take by mouth. As needed   SIMETHICONE (GAS RELIEF EXTRA STRENGTH PO)    Take 1 capsule by mouth as needed.   VITAMIN E 400 UNIT CAPSULE    Take 1,200 Units by mouth daily.   Modified Medications   No medications on file  Discontinued Medications   No medications on file    Physical Exam:  Vitals:   06/30/23 0942  Height: 5' 6.5" (1.689 m)   Body mass index is 21.46 kg/m. Wt Readings from Last 3 Encounters:  06/28/23 135 lb (61.2 kg)  12/28/22 135 lb (61.2 kg)  10/20/22 134 lb (60.8 kg)    Physical Exam Constitutional:      General: She is not in acute distress.    Appearance: She is well-developed. She is not diaphoretic.  HENT:     Head: Normocephalic and atraumatic.     Mouth/Throat:     Pharynx: No oropharyngeal exudate.  Eyes:     Conjunctiva/sclera: Conjunctivae normal.     Pupils: Pupils are equal, round, and reactive to light.  Cardiovascular:     Rate and Rhythm: Normal rate and regular rhythm.     Heart sounds: Normal heart sounds.  Pulmonary:     Effort: Pulmonary effort is normal.     Breath sounds: Normal breath sounds.  Abdominal:     General: Bowel sounds are normal.     Palpations: Abdomen is soft.  Musculoskeletal:     Cervical back: Normal range of motion and neck supple.     Right lower leg: No edema.     Left lower leg: No edema.  Skin:    General: Skin is warm and dry.  Neurological:     Mental Status: She is alert.  Psychiatric:        Mood and Affect: Mood normal.     Labs reviewed: Basic Metabolic Panel: Recent Labs    12/28/22 0922 06/28/23 0940  NA 141 139  K 5.0 4.4  CL 103 102  CO2 31 29  GLUCOSE 97 100*  BUN 18 13  CREATININE 0.84 0.73  CALCIUM 9.9 10.3   Liver Function Tests: Recent Labs    12/28/22 0922 06/28/23 0940  AST 17 15  ALT 11 11  BILITOT 0.4 0.4  PROT 7.0 7.3   No results for input(s): "LIPASE", "AMYLASE" in the last 8760 hours. No results for input(s): "AMMONIA" in the last  8760 hours. CBC:  Recent Labs    12/28/22 0922 06/28/23 0940  WBC 6.0 5.5  NEUTROABS 2,478 2,459  HGB 13.2 12.5  HCT 41.4 39.6  MCV 83.3 84.1  PLT 285 267   Lipid Panel: Recent Labs    12/28/22 0922 06/28/23 0940  CHOL 210* 207*  HDL 66 62  LDLCALC 124* 123*  TRIG 102 108  CHOLHDL 3.2 3.3   TSH: No results for input(s): "TSH" in the last 8760 hours. A1C: Lab Results  Component Value Date   HGBA1C 7.1 (H) 06/28/2023     Assessment/Plan 1. Mixed hyperlipidemia LDL goal <70 and not at goal despite dietary modificatons and lipitor 20. Will increase lipitor to 40 mg daily at this time - atorvastatin (LIPITOR) 40 MG tablet; Take 1 tablet (40 mg total) by mouth daily.  Dispense: 90 tablet; Refill: 1 - Lipid panel - Complete Metabolic Panel with eGFR  2. Osteopenia, unspecified location -Recommended to take calcium 600 mg twice daily with Vitamin D 2000 units daily and weight bearing activity 30 mins/5 days a week  3. Type 2 diabetes mellitus with hyperlipidemia (HCC) -she is not currently on medication, will try to change dietary regimen over the next 3 month and if A1c does not improve (goal <7) discussed medication start. routine foot care/monitoring and to keep up with diabetic eye exams through ophthalmology  - Hemoglobin A1c; Future  4. Primary hypertension Blood pressure well controlled, goal bp <140/90 Continue current medications and dietary modifications follow metabolic panel  Return in about 3 months (around 09/29/2023) for routine follow up- labs prior to appt .:   Paco Cislo K. Biagio Borg [facility] & Adult Medicine [phone]

## 2023-07-01 NOTE — Addendum Note (Signed)
Addended by: Sharon Seller on: 07/01/2023 11:23 AM   Modules accepted: Orders

## 2023-07-05 ENCOUNTER — Other Ambulatory Visit: Payer: Self-pay | Admitting: Nurse Practitioner

## 2023-07-05 DIAGNOSIS — E1169 Type 2 diabetes mellitus with other specified complication: Secondary | ICD-10-CM

## 2023-07-05 DIAGNOSIS — I10 Essential (primary) hypertension: Secondary | ICD-10-CM

## 2023-07-06 ENCOUNTER — Ambulatory Visit
Admission: RE | Admit: 2023-07-06 | Discharge: 2023-07-06 | Disposition: A | Discharge status: Home / Self Care | Payer: Medicare HMO | Source: Ambulatory Visit

## 2023-07-06 DIAGNOSIS — Z1231 Encounter for screening mammogram for malignant neoplasm of breast: Secondary | ICD-10-CM | POA: Diagnosis not present

## 2023-07-15 DIAGNOSIS — K4090 Unilateral inguinal hernia, without obstruction or gangrene, not specified as recurrent: Secondary | ICD-10-CM | POA: Diagnosis not present

## 2023-07-28 ENCOUNTER — Ambulatory Visit: Payer: Medicare HMO | Admitting: Orthopaedic Surgery

## 2023-07-28 ENCOUNTER — Encounter: Payer: Self-pay | Admitting: Orthopaedic Surgery

## 2023-07-28 DIAGNOSIS — M65311 Trigger thumb, right thumb: Secondary | ICD-10-CM

## 2023-07-28 MED ORDER — LIDOCAINE HCL 1 % IJ SOLN
0.3000 mL | INTRAMUSCULAR | Status: AC | PRN
Start: 2023-07-28 — End: 2023-07-28
  Administered 2023-07-28: .3 mL

## 2023-07-28 MED ORDER — BUPIVACAINE HCL 0.5 % IJ SOLN
0.3300 mL | INTRAMUSCULAR | Status: AC | PRN
Start: 2023-07-28 — End: 2023-07-28
  Administered 2023-07-28: .33 mL

## 2023-07-28 MED ORDER — METHYLPREDNISOLONE ACETATE 40 MG/ML IJ SUSP
13.3300 mg | INTRAMUSCULAR | Status: AC | PRN
Start: 2023-07-28 — End: 2023-07-28
  Administered 2023-07-28: 13.33 mg

## 2023-07-28 NOTE — Progress Notes (Signed)
Office Visit Note   Patient: Emily Hayes           Date of Birth: 21-May-1951           MRN: [id number] Visit Date: 07/28/2023              Requested by: Sharon Seller, NP [address] [place],  [place] [address] PCP: Sharon Seller, NP   Assessment & Plan: Visit Diagnoses:  1. Trigger thumb, right thumb     Plan: Impression is recurrent right trigger thumb.  We have discussed various treatment options to include repeat cortisone injection versus surgical intervention.  She would like to try 1 additional injection.  We have also provided her with a new night splint.  She will follow-up with [place] as needed.  Follow-Up Instructions: Return if symptoms worsen or fail to improve.   Orders:  Orders Placed This Encounter  Procedures   Hand/UE Inj   No orders of the defined types were placed in this encounter.     Procedures: Hand/UE Inj: R thumb A1 for trigger finger on 07/28/2023 9:23 AM Indications: pain Details: 25 G needle Medications: 0.3 mL lidocaine 1 %; 0.33 mL bupivacaine 0.5 %; 13.33 mg methylPREDNISolone acetate 40 MG/ML Outcome: tolerated well, no immediate complications Consent was given by the patient. Patient was prepped and draped in the usual sterile fashion.       Clinical Data: No additional findings.   Subjective: Chief Complaint  Patient presents with   Right Hand - Pain    Thumb pain    HPI patient is a pleasant 72 year old female who comes in today with recurrent right thumb pain and triggering.  She was seen in our office about 2-1/2 months ago for this.  Right thumb trigger finger was injected and she was provided a night splint.  She notes that the injection only helped for a few days.  Her symptoms have returned.  Review of Systems as detailed in HPI.  All others reviewed and are negative.   Objective: Vital Signs: There were no vitals taken for this visit.  Physical Exam well-developed well-nourished female no acute  distress.  Alert and oriented x 3.  Ortho Exam right thumb exam: Tenderness as well as a palpable nodule at the A1 pulley.  She does have reproducible triggering.  She is neurovascularly intact distally.  Specialty Comments:  No specialty comments available.  Imaging: No new imaging   PMFS History: Patient Active Problem List   Diagnosis Date Noted   Pain of right hand 10/21/2022   Abnormal mammogram of right breast 02/10/2019   Allergic rhinitis 12/30/2017   Osteopenia 12/30/2017   Osteoarthritis of multiple joints 12/30/2017   Mixed hyperlipidemia 12/30/2017   Past Medical History:  Diagnosis Date   Cervical radiculopathy    Fatigue    Generalized osteoarthrosis    HTN (hypertension)    Hyperlipidemia    Lateral epicondylitis of left elbow 06/28/2017   Left knee pain    Menopause    Neck pain    Pneumonia    Pre-diabetes    Sciatica    Swelling of left knee joint    Uterine cancer (HCC)     Family History  Problem Relation Age of Onset   Stroke Mother    Diabetes Mother    Dementia Mother    Alzheimer's disease Mother    Colon polyps Brother    Hypertension Brother    Hyperlipidemia Brother    Cancer  Sister        unknown   Cancer Sister        unknown   Kidney failure Brother    Cancer Sister        unknown   Lung cancer Brother    Cancer Sister        unknown   Colon cancer Neg Hx    Esophageal cancer Neg Hx    Rectal cancer Neg Hx    Stomach cancer Neg Hx     Past Surgical History:  Procedure Laterality Date   ABCESS DRAINAGE Left 09/15/2017   behind tonsil   APPENDECTOMY     VAGINAL HYSTERECTOMY  1983   Social History   Occupational History   Occupation: retired  Tobacco Use   Smoking status: Every Day    Current packs/day: 0.50    Average packs/day: 0.5 packs/day for 50.0 years (25.0 ttl pk-yrs)    Types: Cigarettes   Smokeless tobacco: Never  Vaping Use   Vaping status: Never Used  Substance and Sexual Activity   Alcohol use:  No   Drug use: No   Sexual activity: Not on file

## 2023-08-27 ENCOUNTER — Encounter: Payer: Self-pay | Admitting: Nurse Practitioner

## 2023-08-27 ENCOUNTER — Ambulatory Visit (INDEPENDENT_AMBULATORY_CARE_PROVIDER_SITE_OTHER): Payer: Medicare HMO | Admitting: Nurse Practitioner

## 2023-08-27 VITALS — BP 138/86 | HR 88 | Temp 97.1°F | Ht 66.5 in | Wt 133.6 lb

## 2023-08-27 DIAGNOSIS — M542 Cervicalgia: Secondary | ICD-10-CM | POA: Diagnosis not present
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
## 2023-08-27 NOTE — Patient Instructions (Signed)
Aleve 1 tablet by mouth twice daily with food for 1 week.  to take cyclobenzaprine at night routinely for 1 week then as needed  To use heat 3 times daily followed by muscle rub

## 2023-09-27 ENCOUNTER — Other Ambulatory Visit: Payer: Medicare HMO

## 2023-09-27 ENCOUNTER — Other Ambulatory Visit: Payer: Self-pay

## 2023-09-27 DIAGNOSIS — E782 Mixed hyperlipidemia: Secondary | ICD-10-CM | POA: Diagnosis not present

## 2023-09-27 DIAGNOSIS — E1169 Type 2 diabetes mellitus with other specified complication: Secondary | ICD-10-CM

## 2023-09-27 DIAGNOSIS — E785 Hyperlipidemia, unspecified: Secondary | ICD-10-CM | POA: Diagnosis not present

## 2023-09-28 LAB — LIPID PANEL
Cholesterol: 208 mg/dL — ABNORMAL HIGH (ref <200)
HDL: 64 mg/dL (ref >=50)
LDL Cholesterol (Calc): 121 mg/dL — ABNORMAL HIGH
Non-HDL Cholesterol (Calc): 144 mg/dL — ABNORMAL HIGH (ref <130)
Total CHOL/HDL Ratio: 3.3 (calc) (ref <5.0)
Triglycerides: 122 mg/dL (ref <150)

## 2023-09-28 LAB — COMPLETE METABOLIC PANEL WITH GFR
AG Ratio: 1.8 (calc) (ref 1.0–2.5)
ALT: 11 U/L (ref 6–29)
AST: 16 U/L (ref 10–35)
Albumin: 4.5 g/dL (ref 3.6–5.1)
Alkaline phosphatase (APISO): 60 U/L (ref 37–153)
BUN: 15 mg/dL (ref 7–25)
CO2: 30 mmol/L (ref 20–32)
Calcium: 9.9 mg/dL (ref 8.6–10.4)
Chloride: 102 mmol/L (ref 98–110)
Creat: 0.76 mg/dL (ref 0.60–1.00)
Globulin: 2.5 g/dL (ref 1.9–3.7)
Glucose, Bld: 97 mg/dL (ref 65–99)
Potassium: 4.3 mmol/L (ref 3.5–5.3)
Sodium: 140 mmol/L (ref 135–146)
Total Bilirubin: 0.4 mg/dL (ref 0.2–1.2)
Total Protein: 7 g/dL (ref 6.1–8.1)
eGFR: 82 mL/min/{1.73_m2} (ref >=60)

## 2023-09-28 LAB — HEMOGLOBIN A1C
Hgb A1c MFr Bld: 7.2 %{Hb} — ABNORMAL HIGH (ref <5.7)
Mean Plasma Glucose: 160 mg/dL
eAG (mmol/L): 8.9 mmol/L

## 2023-10-01 ENCOUNTER — Ambulatory Visit (INDEPENDENT_AMBULATORY_CARE_PROVIDER_SITE_OTHER): Payer: Medicare HMO | Admitting: Nurse Practitioner

## 2023-10-01 VITALS — BP 122/78 | HR 63 | Temp 97.8°F | Ht 66.5 in | Wt 131.0 lb

## 2023-10-01 DIAGNOSIS — M8580 Other specified disorders of bone density and structure, unspecified site: Secondary | ICD-10-CM | POA: Diagnosis not present

## 2023-10-01 DIAGNOSIS — I10 Essential (primary) hypertension: Secondary | ICD-10-CM | POA: Diagnosis not present

## 2023-10-01 DIAGNOSIS — F17200 Nicotine dependence, unspecified, uncomplicated: Secondary | ICD-10-CM

## 2023-10-01 DIAGNOSIS — E1169 Type 2 diabetes mellitus with other specified complication: Secondary | ICD-10-CM | POA: Diagnosis not present

## 2023-10-01 DIAGNOSIS — E785 Hyperlipidemia, unspecified: Secondary | ICD-10-CM

## 2023-10-01 DIAGNOSIS — E782 Mixed hyperlipidemia: Secondary | ICD-10-CM

## 2023-10-01 DIAGNOSIS — M542 Cervicalgia: Secondary | ICD-10-CM | POA: Diagnosis not present

## 2023-10-01 MED ORDER — ROSUVASTATIN CALCIUM 20 MG PO TABS
20.0000 mg | ORAL_TABLET | Freq: Every day | ORAL | 1 refills | Status: DC
Start: 2023-10-01 — End: 2023-12-07

## 2023-10-01 NOTE — Patient Instructions (Signed)
Stop Lipitor Start Crestor- start with half tablet for 2 weeks then increase to whole tablet.

## 2023-10-01 NOTE — Progress Notes (Signed)
Careteam: Patient Care Team: Sharon Seller, NP as PCP - General (Geriatric Medicine) Ander Purpura, OD (Optometry)  PLACE OF SERVICE:  [facility]  Advanced Directive information    No Known Allergies  Chief Complaint  Patient presents with   Medical Management of Chronic Issues    3 month follow-up and discuss labs. Discuss need for td/tdap and coivd booster.     HPI: Patient is a 72 y.o. female for routine follow up  She is really trying to work on diet.  She loves mac and cheese and it is her weakness.  She is trying to watch fiber to carbohydrate ratio She does not eat a lot of candy but loves cakes and pies Thanksgiving is her favorite holiday and her birthday was in October and her daughter made her homemade cakes.  She loves to bake.  Making cookies for christmas gifts- tasting a little bit.  Reports after christmas she plans to cut back significantly   She increased Lipitor to 40 mg at last visit and has been taking every evening.  LDL has decreased by 2 points now 121   Review of Systems:  Review of Systems  Constitutional:  Negative for chills, fever and weight loss.  HENT:  Negative for tinnitus.   Respiratory:  Negative for cough, sputum production and shortness of breath.   Cardiovascular:  Negative for chest pain, palpitations and leg swelling.  Gastrointestinal:  Negative for abdominal pain, constipation, diarrhea and heartburn.  Genitourinary:  Negative for dysuria, frequency and urgency.  Musculoskeletal:  Positive for neck pain. Negative for back pain, falls, joint pain and myalgias.  Skin: Negative.   Neurological:  Negative for dizziness and headaches.  Psychiatric/Behavioral:  Negative for depression and memory loss. The patient does not have insomnia.     Past Medical History:  Diagnosis Date   Cervical radiculopathy    Fatigue    Generalized osteoarthrosis    HTN (hypertension)    Hyperlipidemia    Lateral epicondylitis of left  elbow 06/28/2017   Left knee pain    Menopause    Neck pain    Pneumonia    Pre-diabetes    Sciatica    Swelling of left knee joint    Uterine cancer [facility])    Past Surgical History:  Procedure Laterality Date   ABCESS DRAINAGE Left 09/15/2017   behind tonsil   APPENDECTOMY     VAGINAL HYSTERECTOMY  1983   Social History:   reports that she has been smoking cigarettes. She has a 25 pack-year smoking history. She has never used smokeless tobacco. She reports that she does not drink alcohol and does not use drugs.  Family History  Problem Relation Age of Onset   Stroke Mother    Diabetes Mother    Dementia Mother    Alzheimer's disease Mother    Colon polyps Brother    Hypertension Brother    Hyperlipidemia Brother    Cancer Sister        unknown   Cancer Sister        unknown   Kidney failure Brother    Cancer Sister        unknown   Lung cancer Brother    Cancer Sister        unknown   Colon cancer Neg Hx    Esophageal cancer Neg Hx    Rectal cancer Neg Hx    Stomach cancer Neg Hx     Medications: Patient's Medications  New Prescriptions   No medications on file  Previous Medications   ACETAMINOPHEN (TYLENOL) 500 MG TABLET    Take 500 mg by mouth daily as needed.   ATORVASTATIN (LIPITOR) 40 MG TABLET    Take 1 tablet (40 mg total) by mouth daily.   CALCIUM CITRATE-VITAMIN D (CALCIUM CITRATE + D PO)    Take 1 tablet by mouth 2 (two) times daily.   CHOLECALCIFEROL (VITAMIN D-3) 25 MCG (1000 UT) CAPS    Take 1 capsule by mouth in the morning and at bedtime.   CYCLOBENZAPRINE (FLEXERIL) 10 MG TABLET    Take 1 tablet (10 mg total) by mouth 2 (two) times daily as needed for muscle spasms.   DIPHENHYDRAMINE (BENADRYL) 25 MG TABLET    Take 25 mg by mouth as needed.    FLUTICASONE (FLONASE) 50 MCG/ACT NASAL SPRAY    Place 1 spray into both nostrils as needed for allergies or rhinitis.   LOSARTAN (COZAAR) 25 MG TABLET    Take 1 tablet (25 mg total) by mouth daily.    MAGNESIUM CARBONATE, ANTACID, (MAGNESIUM CARBONATE PO)    Take 240 mg by mouth 2 (two) times daily.   MISC NATURAL PRODUCTS (COLON CLEANSE) CAPS    Take by mouth. As needed   SIMETHICONE (GAS RELIEF EXTRA STRENGTH PO)    Take 1 capsule by mouth as needed.   VITAMIN E 400 UNIT CAPSULE    Take 1,200 Units by mouth daily.   Modified Medications   No medications on file  Discontinued Medications   No medications on file    Physical Exam:  Vitals:   10/01/23 0934  BP: 122/78  Pulse: 63  Temp: 97.8 F (36.6 C)  TempSrc: Temporal  SpO2: 99%  Weight: 131 lb (59.4 kg)  Height: 5' 6.5" (1.689 m)   Body mass index is 20.83 kg/m. Wt Readings from Last 3 Encounters:  10/01/23 131 lb (59.4 kg)  08/26/23 133 lb 9.6 oz (60.6 kg)  06/28/23 135 lb (61.2 kg)    Physical Exam Constitutional:      General: She is not in acute distress.    Appearance: She is well-developed. She is not diaphoretic.  HENT:     Head: Normocephalic and atraumatic.     Mouth/Throat:     Pharynx: No oropharyngeal exudate.  Eyes:     Conjunctiva/sclera: Conjunctivae normal.     Pupils: Pupils are equal, round, and reactive to light.  Cardiovascular:     Rate and Rhythm: Normal rate and regular rhythm.     Heart sounds: Normal heart sounds.  Pulmonary:     Effort: Pulmonary effort is normal.     Breath sounds: Normal breath sounds.  Abdominal:     General: Bowel sounds are normal.     Palpations: Abdomen is soft.  Musculoskeletal:     Cervical back: Normal range of motion and neck supple.     Right lower leg: No edema.     Left lower leg: No edema.  Skin:    General: Skin is warm and dry.  Neurological:     Mental Status: She is alert.  Psychiatric:        Mood and Affect: Mood normal.     Labs reviewed: Basic Metabolic Panel: Recent Labs    12/28/22 0922 06/28/23 0940 09/27/23 0815  NA 141 139 140  K 5.0 4.4 4.3  CL 103 102 102  CO2 31 29 30   GLUCOSE 97 100* 97  BUN 18 13  15   CREATININE 0.84 0.73 0.76  CALCIUM 9.9 10.3 9.9   Liver Function Tests: Recent Labs    12/28/22 0922 06/28/23 0940 09/27/23 0815  AST 17 15 16   ALT 11 11 11   BILITOT 0.4 0.4 0.4  PROT 7.0 7.3 7.0   No results for input(s): "LIPASE", "AMYLASE" in the last 8760 hours. No results for input(s): "AMMONIA" in the last 8760 hours. CBC: Recent Labs    12/28/22 0922 06/28/23 0940  WBC 6.0 5.5  NEUTROABS 2,478 2,459  HGB 13.2 12.5  HCT 41.4 39.6  MCV 83.3 84.1  PLT 285 267   Lipid Panel: Recent Labs    12/28/22 0922 06/28/23 0940 09/27/23 0815  CHOL 210* 207* 208*  HDL 66 62 64  LDLCALC 124* 123* 121*  TRIG 102 108 122  CHOLHDL 3.2 3.3 3.3   TSH: No results for input(s): "TSH" in the last 8760 hours. A1C: Lab Results  Component Value Date   HGBA1C 7.2 (H) 09/27/2023     Assessment/Plan 1. Type 2 diabetes mellitus with hyperlipidemia (HCC) (Primary) -continues with dietary modifications- reports she will be very strict once christmas goes. Strongly encouraged dietary compliance, routine foot care/monitoring and to keep up with diabetic eye exams through ophthalmology  - Hemoglobin A1c; Future  2. Mixed hyperlipidemia Continue dietary modifications -stop lipitor and will start crestor, she will start half tablet for 2 weeks then increase to whole table.  - rosuvastatin (CRESTOR) 20 MG tablet; Take 1 tablet (20 mg total) by mouth daily.  Dispense: 90 tablet; Refill: 1 - Lipid panel; Future - COMPLETE METABOLIC PANEL WITH GFR; Future  3. Primary hypertension -Blood pressure well controlled, goal bp <140/90 Continue current medications and dietary modifications follow metabolic panel - COMPLETE METABOLIC PANEL WITH GFR; Future - CBC with Differential/Platelet; Future  4. Osteopenia, unspecified location -Recommended to take calcium 600 mg twice daily with Vitamin D 2000 units daily and weight bearing activity 30 mins/5 days a week  5. Neck pain on right  side Comes and goes, worse with repetitive activity     6. Smoker Recommended cessation   Return in about 6 months (around 03/31/2024) for routine follow up .  Janene Harvey. Biagio Borg [facility] [phone]

## 2023-12-03 ENCOUNTER — Other Ambulatory Visit: Payer: Self-pay | Admitting: Nurse Practitioner

## 2023-12-03 DIAGNOSIS — M62838 Other muscle spasm: Secondary | ICD-10-CM

## 2023-12-07 ENCOUNTER — Telehealth: Payer: Self-pay

## 2023-12-07 DIAGNOSIS — E785 Hyperlipidemia, unspecified: Secondary | ICD-10-CM

## 2023-12-07 DIAGNOSIS — E782 Mixed hyperlipidemia: Secondary | ICD-10-CM

## 2023-12-07 DIAGNOSIS — M62838 Other muscle spasm: Secondary | ICD-10-CM

## 2023-12-07 DIAGNOSIS — I10 Essential (primary) hypertension: Secondary | ICD-10-CM

## 2023-12-07 MED ORDER — CYCLOBENZAPRINE HCL 10 MG PO TABS
10.0000 mg | ORAL_TABLET | Freq: Two times a day (BID) | ORAL | 1 refills | Status: DC | PRN
Start: 2023-12-07 — End: 2024-08-02

## 2023-12-07 MED ORDER — LOSARTAN POTASSIUM 25 MG PO TABS: 25.0000 mg | ORAL_TABLET | Freq: Every day | ORAL | 1 refills | Status: AC

## 2023-12-07 MED ORDER — ROSUVASTATIN CALCIUM 20 MG PO TABS
20.0000 mg | ORAL_TABLET | Freq: Every day | ORAL | 1 refills | Status: DC
Start: 2023-12-07 — End: 2024-01-31

## 2023-12-07 NOTE — Telephone Encounter (Signed)
Patient called to say that she would like [facility] listed as her primary pharmacy as she would have no co-pay and [facility] as a back up or for short term medications.   Patient asked that I send all 3 prescribed medications to [facility] at this time, however to note that she does not need losartan and crestor at this time

## 2023-12-13 ENCOUNTER — Encounter: Payer: Self-pay | Admitting: Family

## 2023-12-13 ENCOUNTER — Ambulatory Visit (INDEPENDENT_AMBULATORY_CARE_PROVIDER_SITE_OTHER): Payer: Medicare HMO | Admitting: Family

## 2023-12-13 VITALS — BP 124/76 | HR 90 | Temp 97.6°F | Resp 20 | Ht 67.2 in | Wt 128.4 lb

## 2023-12-13 DIAGNOSIS — R634 Abnormal weight loss: Secondary | ICD-10-CM | POA: Diagnosis not present

## 2023-12-13 DIAGNOSIS — M65311 Trigger thumb, right thumb: Secondary | ICD-10-CM

## 2023-12-13 DIAGNOSIS — L723 Sebaceous cyst: Secondary | ICD-10-CM

## 2023-12-13 NOTE — Progress Notes (Signed)
 Provider: Tiffnay Bossi FNP-C  Sharon Seller, NP  Patient Care Team: Sharon Seller, NP as PCP - General (Geriatric Medicine) Ander Purpura, OD [facility])  Extended Emergency Contact Information Primary Emergency Contact: Mateus,[name] Address: [address]          [place], [place] [address] [place] of [place] Home Phone: [phone] Relation: Daughter  Code Status:  DNR Goals of care: Advanced Directive information    12/13/2023    1:43 PM  Advanced Directives  Does Patient Have a Medical Advance Directive? Yes  Type of Advance Directive Out of facility DNR (pink MOST or yellow form)  Does patient want to make changes to medical advance directive? No - Patient declined     Chief Complaint  Patient presents with   Referral    New ortho referral.    Discussed the use of AI scribe software for clinical note transcription with the patient, who gave verbal consent to proceed.  History of Present Illness   Emily Hayes is a 73 year old female who presents with a cyst on her right thumb for orthopedic referral.  She has had a cyst on her right thumb for the past three weeks, which has increased in size over the last week. The cyst is painful, especially when twisting or opening objects, affecting her ability to perform tasks with her dominant hand. The cyst is not red and does not hurt to touch. She has a history of a similar cyst that was previously drained by an orthopedic specialist.  She has a history of trigger finger syndrome in the same hand, for which she received two cortisone injections, resulting in improvement of symptoms. Her thumb used to 'pop all the time and jerk back and forth on its own' before treatment.  She reports a recent weight loss from 131 pounds to 128.4 pounds, attributed to dietary changes while awaiting new dentures. She has been consuming soft foods and maintains a diet including fish, [place], chicken, yogurt, and  fruits. She does not use protein supplements.  She mentions a hernia in her abdomen that has been present for two years. It was painful at one point, prompting a visit to a surgeon, but it has not been bothersome recently.    Past Medical History:  Diagnosis Date   Cervical radiculopathy    Fatigue    Generalized osteoarthrosis    HTN (hypertension)    Hyperlipidemia    Lateral epicondylitis of left elbow 06/28/2017   Left knee pain    Menopause    Neck pain    Pneumonia    Pre-diabetes    Sciatica    Swelling of left knee joint    Uterine cancer [facility])    Past Surgical History:  Procedure Laterality Date   ABCESS DRAINAGE Left 09/15/2017   behind tonsil   APPENDECTOMY     VAGINAL HYSTERECTOMY  1983    No Known Allergies  Outpatient Encounter Medications as of 12/13/2023  Medication Sig   acetaminophen (TYLENOL) 500 MG tablet Take 500 mg by mouth daily as needed.   Calcium Citrate-Vitamin D (CALCIUM CITRATE + D PO) Take 1 tablet by mouth 2 (two) times daily.   cyclobenzaprine (FLEXERIL) 10 MG tablet Take 1 tablet (10 mg total) by mouth 2 (two) times daily as needed for muscle spasms.   diphenhydrAMINE (BENADRYL) 25 MG tablet Take 25 mg by mouth as needed.    losartan (COZAAR) 25 MG tablet Take 1 tablet (25 mg total)  by mouth daily.   Magnesium Carbonate, Antacid, (MAGNESIUM CARBONATE PO) Take 240 mg by mouth 2 (two) times daily.   Misc Natural Products (COLON CLEANSE) CAPS Take by mouth. As needed   rosuvastatin (CRESTOR) 20 MG tablet Take 1 tablet (20 mg total) by mouth daily.   Simethicone (GAS RELIEF EXTRA STRENGTH PO) Take 1 capsule by mouth as needed.   vitamin E 400 UNIT capsule Take 1,200 Units by mouth daily.    [DISCONTINUED] fluticasone (FLONASE) 50 MCG/ACT nasal spray Place 1 spray into both nostrils as needed for allergies or rhinitis. (Patient not taking: Reported on 12/13/2023)   No facility-administered encounter medications on file as of 12/13/2023.     Review of Systems  Constitutional:  Negative for chills, fatigue and fever.  Musculoskeletal:  Negative for arthralgias, joint swelling and myalgias.       Right thumb cyst   Skin:  Negative for color change, pallor, rash and wound.  Neurological:  Negative for weakness and numbness.    Immunization History  Administered Date(s) Administered   Moderna Covid-19 Fall Seasonal Vaccine 53yrs & older 09/27/2022   Moderna Covid-19 Vaccine Bivalent Booster 63yrs & up 06/25/2021   Moderna SARS-COV2 Booster Vaccination 01/28/2021   Moderna Sars-Covid-2 Vaccination 12/01/2019, 12/29/2019, 08/17/2020   RSV,unspecified 09/19/2023   Tdap 02/05/2011, 09/19/2023   Unspecified SARS-COV-2 Vaccination 09/19/2023   Zoster Recombinant(Shingrix) 03/19/2017, 07/19/2017   Pertinent  Health Maintenance Due  Topic Date Due   OPHTHALMOLOGY EXAM  11/11/2023   DEXA SCAN  11/27/2023   FOOT EXAM  12/28/2023   HEMOGLOBIN A1C  03/27/2024   MAMMOGRAM  07/05/2025   Colonoscopy  01/02/2031   INFLUENZA VACCINE  Discontinued      06/28/2023    8:56 AM 06/30/2023    9:54 AM 08/27/2023    8:36 AM 10/01/2023   10:20 AM 12/13/2023    1:42 PM  Fall Risk  Falls in the past year? 0 0 0 0 0  Was there an injury with Fall? 0 0 0 0 0  Fall Risk Category Calculator 0 0 0 0 0  Patient at Risk for Falls Due to No Fall Risks  No Fall Risks No Fall Risks   Fall risk Follow up Falls evaluation completed  Falls evaluation completed Falls evaluation completed    Functional Status Survey:    Vitals:   12/13/23 1348  BP: 124/76  Pulse: 90  Resp: 20  Temp: 97.6 F (36.4 C)  SpO2: 97%  Weight: 128 lb 6.4 oz (58.2 kg)  Height: 5' 7.2" (1.707 m)   Body mass index is 19.99 kg/m. Physical Exam Constitutional:      General: She is not in acute distress.    Appearance: She is not ill-appearing.  Cardiovascular:     Rate and Rhythm: Normal rate and regular rhythm.     Pulses: Normal pulses.     Heart sounds: Normal  heart sounds. No murmur heard.    No friction rub. No gallop.  Pulmonary:     Effort: Pulmonary effort is normal. No respiratory distress.     Breath sounds: Normal breath sounds. No wheezing, rhonchi or rales.  Chest:     Chest wall: No tenderness.  Musculoskeletal:     Right hand: No deformity or tenderness. Normal range of motion. Normal strength. Normal sensation. Normal capillary refill. Normal pulse.     Left hand: Normal.     Comments: Right base of thumb pea size,round, fluid filled swelling moveable and non-tender to  palpation.surrounding skin tissue without any erythema.   Skin:    General: Skin is warm and dry.     Coloration: Skin is not pale.     Findings: No bruising, erythema, lesion or rash.  Neurological:     Mental Status: She is alert and oriented to person, place, and time.     Sensory: No sensory deficit.     Motor: No weakness.     Coordination: Coordination normal.     Gait: Gait normal.  Psychiatric:        Mood and Affect: Mood normal.        Behavior: Behavior normal.    Physical Exam   VITALS: T- 97.6, P- 90, BP- 124/76, SaO2- 97% MEASUREMENTS: Weight- 128.4. MUSCULOSKELETAL: Cyst on right thumb, non-movable, pea-sized.      Labs reviewed: Recent Labs    12/28/22 0922 06/28/23 0940 09/27/23 0815  NA 141 139 140  K 5.0 4.4 4.3  CL 103 102 102  CO2 31 29 30   GLUCOSE 97 100* 97  BUN 18 13 15   CREATININE 0.84 0.73 0.76  CALCIUM 9.9 10.3 9.9   Recent Labs    12/28/22 0922 06/28/23 0940 09/27/23 0815  AST 17 15 16   ALT 11 11 11   BILITOT 0.4 0.4 0.4  PROT 7.0 7.3 7.0   Recent Labs    12/28/22 0922 06/28/23 0940  WBC 6.0 5.5  NEUTROABS 2,478 2,459  HGB 13.2 12.5  HCT 41.4 39.6  MCV 83.3 84.1  PLT 285 267   Lab Results  Component Value Date   TSH 1.34 10/14/2016   Lab Results  Component Value Date   HGBA1C 7.2 (H) 09/27/2023   Lab Results  Component Value Date   CHOL 208 (H) 09/27/2023   HDL 64 09/27/2023   LDLCALC 121  (H) 09/27/2023   TRIG 122 09/27/2023   CHOLHDL 3.3 09/27/2023    Significant Diagnostic Results in last 30 days:  No results found.  Assessment/Plan  Thumb Cyst Presents with a painful cyst on the right thumb, present for about three weeks and increasing in size over the last week. No redness or tenderness to touch. Likely a sebaceous cyst. Previous history of similar cysts on the same hand, previously drained by Dr. Deno Etienne. Discussed referral to orthopedic specialist for evaluation and possible drainage. Explained that the procedure is generally low risk but may involve minor discomfort and a small risk of infection. Benefits include relief from pain and improved function of the thumb. Alternative is to monitor the cyst if it does not cause significant discomfort. - Refer to orthopedic specialist (Dr. Deno Etienne) for evaluation and possible drainage  Trigger Finger Syndrome Trigger finger syndrome in the right thumb, previously treated with two cortisone shots by Dr. Deno Etienne, which provided relief. No current symptoms reported. Discussed that recurrence is possible and monitoring is necessary. - Monitor for recurrence of symptoms  Hernia Reports a hernia in the abdominal area, present for two years. Previously evaluated by a Careers adviser. No current pain or increase in size. Advised to monitor for changes in size or pain. Discussed that if the hernia becomes painful or increases in size, surgical intervention may be necessary. Risks of surgery include infection and recurrence, but benefits include relief from pain and prevention of complications such as strangulation. - Monitor for changes in size or pain - Seek surgical consultation if symptoms worsen  Weight Loss Experienced weight loss from 131 lbs to 128.4 lbs, likely due to difficulty eating solid foods  while awaiting new dentures. Reports a balanced diet including fish, [place], chicken, yogurt, and fruits. Discussed the importance of maintaining adequate  protein intake to prevent further weight loss and maintain muscle mass. - Encourage balanced diet with adequate protein intake - Monitor weight and nutritional status  General Health Maintenance Blood pressure is excellent at 124/76 mmHg. Heart rate slightly elevated at 90 bpm. Temperature and oxygen saturation are within normal limits. Maintains good balance and muscle mass. Discussed the importance of regular physical activity and a balanced diet to maintain overall health. - Recheck heart rate - Encourage continued balanced diet and physical activity  Follow-up - Expect a phone call from orthopedic specialist within 1-2 weeks - Contact orthopedic specialist if no call received within 2 weeks.   Family/ staff Communication: Reviewed plan of care with patient verbalized understanding   Labs/tests ordered: None   Next Appointment: Return if symptoms worsen or fail to improve.   Total time: 15 minutes. Greater than 50% of total time spent doing patient education regarding right thumb cyst,right trigger finger,health maintenance including symptom/medication management.   Caesar Bookman, NP

## 2023-12-17 ENCOUNTER — Ambulatory Visit (INDEPENDENT_AMBULATORY_CARE_PROVIDER_SITE_OTHER): Payer: Medicare HMO | Admitting: Orthopaedic Surgery

## 2023-12-17 ENCOUNTER — Encounter: Payer: Self-pay | Admitting: Orthopaedic Surgery

## 2023-12-17 DIAGNOSIS — M25841 Other specified joint disorders, right hand: Secondary | ICD-10-CM

## 2023-12-17 NOTE — Progress Notes (Signed)
 Office Visit Note   Patient: Emily Hayes           Date of Birth: 1951-03-03           MRN: [id number] Visit Date: 12/17/2023              Requested by: Caesar Bookman, NP [address] [place],  [place] [address] PCP: Sharon Seller, NP   Assessment & Plan: Visit Diagnoses:  1. Cyst of joint of right hand     Plan: Guliana is a 73 year old female with symptomatic ganglion cyst to the base of the right thumb and the right long finger.  I decompressed the long finger cyst a year ago in the office with a 18-gauge needle and she states that this never fully resolved.  The thumb cyst has been noticeable for about 3 weeks.  This is affecting hand function.  Due to history of incomplete relief with the right long finger cyst I have recommended surgical excision of both in the operating theater.  Risk benefits prognosis reviewed.  Eunice Blase will call her in the near future to confirm surgery date.  Follow-Up Instructions: No follow-ups on file.   Orders:  No orders of the defined types were placed in this encounter.  No orders of the defined types were placed in this encounter.     Procedures: No procedures performed   Clinical Data: No additional findings.   Subjective: Chief Complaint  Patient presents with   Right Hand - Pain    thumb    HPI Emily Hayes comes in today for evaluation of cyst in her right hand.  She had 1 in the long finger that I decompressed with 18-gauge needle about a year ago.  She states that this never fully resolved.  She developed another 1 about 3 weeks ago at the base of her right thumb.  These are both affecting hand function. Review of Systems  Constitutional: Negative.   HENT: Negative.    Eyes: Negative.   Respiratory: Negative.    Cardiovascular: Negative.   Endocrine: Negative.   Musculoskeletal: Negative.   Neurological: Negative.   Hematological: Negative.   Psychiatric/Behavioral: Negative.    All other systems reviewed and  are negative.    Objective: Vital Signs: There were no vitals taken for this visit.  Physical Exam Vitals and nursing note reviewed.  Constitutional:      Appearance: She is well-developed.  HENT:     Head: Normocephalic and atraumatic.  Pulmonary:     Effort: Pulmonary effort is normal.  Abdominal:     Palpations: Abdomen is soft.  Musculoskeletal:     Cervical back: Neck supple.  Skin:    General: Skin is warm.     Capillary Refill: Capillary refill takes less than 2 seconds.  Neurological:     Mental Status: She is alert and oriented to person, place, and time.  Psychiatric:        Behavior: Behavior normal.        Thought Content: Thought content normal.        Judgment: Judgment normal.     Ortho Exam Examination of right hand shows a small cyst in the palm in line with the long finger and at the base of the thumb.  There is no triggering.  No skin changes.  Full arc of motion. Specialty Comments:  No specialty comments available.  Imaging: No results found.   PMFS History: Patient Active Problem List   Diagnosis Date  Noted   Pain of right hand 10/21/2022   Abnormal mammogram of right breast 02/10/2019   Allergic rhinitis 12/30/2017   Osteopenia 12/30/2017   Osteoarthritis of multiple joints 12/30/2017   Mixed hyperlipidemia 12/30/2017   Past Medical History:  Diagnosis Date   Cervical radiculopathy    Fatigue    Generalized osteoarthrosis    HTN (hypertension)    Hyperlipidemia    Lateral epicondylitis of left elbow 06/28/2017   Left knee pain    Menopause    Neck pain    Pneumonia    Pre-diabetes    Sciatica    Swelling of left knee joint    Uterine cancer (HCC)     Family History  Problem Relation Age of Onset   Stroke Mother    Diabetes Mother    Dementia Mother    Alzheimer's disease Mother    Colon polyps Brother    Hypertension Brother    Hyperlipidemia Brother    Cancer Sister        unknown   Cancer Sister        unknown    Kidney failure Brother    Cancer Sister        unknown   Lung cancer Brother    Cancer Sister        unknown   Colon cancer Neg Hx    Esophageal cancer Neg Hx    Rectal cancer Neg Hx    Stomach cancer Neg Hx     Past Surgical History:  Procedure Laterality Date   ABCESS DRAINAGE Left 09/15/2017   behind tonsil   APPENDECTOMY     VAGINAL HYSTERECTOMY  1983   Social History   Occupational History   Occupation: retired  Tobacco Use   Smoking status: Every Day    Current packs/day: 0.50    Average packs/day: 0.5 packs/day for 50.0 years (25.0 ttl pk-yrs)    Types: Cigarettes   Smokeless tobacco: Never  Vaping Use   Vaping status: Never Used  Substance and Sexual Activity   Alcohol use: No   Drug use: No   Sexual activity: Not on file

## 2023-12-22 ENCOUNTER — Encounter: Payer: Self-pay | Admitting: Orthopaedic Surgery

## 2023-12-22 ENCOUNTER — Other Ambulatory Visit: Payer: Self-pay | Admitting: Physician Assistant

## 2023-12-22 MED ORDER — HYDROCODONE-ACETAMINOPHEN 5-325 MG PO TABS: 1.0000 | ORAL_TABLET | Freq: Three times a day (TID) | ORAL | 0 refills | Status: DC | PRN

## 2023-12-22 MED ORDER — ONDANSETRON HCL 4 MG PO TABS: 4.0000 mg | ORAL_TABLET | Freq: Three times a day (TID) | ORAL | 0 refills | Status: DC | PRN

## 2023-12-23 ENCOUNTER — Other Ambulatory Visit: Payer: Self-pay | Admitting: Physician Assistant

## 2023-12-24 ENCOUNTER — Other Ambulatory Visit: Payer: Self-pay

## 2023-12-24 ENCOUNTER — Encounter (HOSPITAL_BASED_OUTPATIENT_CLINIC_OR_DEPARTMENT_OTHER): Payer: Self-pay | Admitting: Orthopaedic Surgery

## 2023-12-27 ENCOUNTER — Encounter (HOSPITAL_BASED_OUTPATIENT_CLINIC_OR_DEPARTMENT_OTHER)
Admission: RE | Admit: 2023-12-27 | Discharge: 2023-12-27 | Disposition: A | Discharge status: Home / Self Care | Source: Ambulatory Visit | Attending: Orthopaedic Surgery | Admitting: Orthopaedic Surgery

## 2023-12-27 DIAGNOSIS — I10 Essential (primary) hypertension: Secondary | ICD-10-CM | POA: Insufficient documentation

## 2023-12-27 DIAGNOSIS — Z01810 Encounter for preprocedural cardiovascular examination: Secondary | ICD-10-CM | POA: Diagnosis not present

## 2023-12-27 NOTE — Progress Notes (Signed)

## 2023-12-29 ENCOUNTER — Other Ambulatory Visit: Payer: Self-pay

## 2023-12-29 ENCOUNTER — Ambulatory Visit (HOSPITAL_BASED_OUTPATIENT_CLINIC_OR_DEPARTMENT_OTHER): Admitting: Certified Registered"

## 2023-12-29 ENCOUNTER — Ambulatory Visit (HOSPITAL_BASED_OUTPATIENT_CLINIC_OR_DEPARTMENT_OTHER)
Admission: RE | Admit: 2023-12-29 | Discharge: 2023-12-29 | Disposition: A | Discharge status: Home / Self Care | Attending: Orthopaedic Surgery | Admitting: Orthopaedic Surgery

## 2023-12-29 ENCOUNTER — Encounter (HOSPITAL_BASED_OUTPATIENT_CLINIC_OR_DEPARTMENT_OTHER): Payer: Self-pay | Admitting: Orthopaedic Surgery

## 2023-12-29 ENCOUNTER — Encounter (HOSPITAL_BASED_OUTPATIENT_CLINIC_OR_DEPARTMENT_OTHER)
Admission: RE | Disposition: A | Discharge status: Home / Self Care | Payer: Self-pay | Source: Home / Self Care | Attending: Orthopaedic Surgery

## 2023-12-29 DIAGNOSIS — M67441 Ganglion, right hand: Secondary | ICD-10-CM | POA: Insufficient documentation

## 2023-12-29 DIAGNOSIS — F17210 Nicotine dependence, cigarettes, uncomplicated: Secondary | ICD-10-CM | POA: Insufficient documentation

## 2023-12-29 DIAGNOSIS — R2231 Localized swelling, mass and lump, right upper limb: Secondary | ICD-10-CM

## 2023-12-29 DIAGNOSIS — I10 Essential (primary) hypertension: Secondary | ICD-10-CM | POA: Diagnosis not present

## 2023-12-29 DIAGNOSIS — M25841 Other specified joint disorders, right hand: Secondary | ICD-10-CM | POA: Diagnosis present

## 2023-12-29 DIAGNOSIS — M65311 Trigger thumb, right thumb: Secondary | ICD-10-CM

## 2023-12-29 HISTORY — PX: EXCISION METACARPAL MASS: SHX6372

## 2023-12-29 SURGERY — EXCISION METACARPAL MASS
Anesthesia: Monitor Anesthesia Care | Site: Hand | Laterality: Right

## 2023-12-29 MED ORDER — ACETAMINOPHEN 500 MG PO TABS
ORAL_TABLET | ORAL | Status: AC
  Filled 2023-12-29: qty 2

## 2023-12-29 MED ORDER — PROPOFOL 500 MG/50ML IV EMUL
INTRAVENOUS | Status: DC | PRN
  Administered 2023-12-29: 100 ug/kg/min via INTRAVENOUS

## 2023-12-29 MED ORDER — BUPIVACAINE HCL (PF) 0.5 % IJ SOLN
INTRAMUSCULAR | Status: AC
Start: 2023-12-29 — End: ?
  Filled 2023-12-29: qty 30

## 2023-12-29 MED ORDER — CELECOXIB 200 MG PO CAPS
200.0000 mg | ORAL_CAPSULE | Freq: Once | ORAL | Status: AC
  Administered 2023-12-29: 200 mg via ORAL

## 2023-12-29 MED ORDER — 0.9 % SODIUM CHLORIDE (POUR BTL) OPTIME
TOPICAL | Status: DC | PRN
  Administered 2023-12-29: 200 mL

## 2023-12-29 MED ORDER — ONDANSETRON HCL 4 MG/2ML IJ SOLN
INTRAMUSCULAR | Status: DC | PRN
  Administered 2023-12-29: 4 mg via INTRAVENOUS

## 2023-12-29 MED ORDER — BUPIVACAINE HCL (PF) 0.25 % IJ SOLN
INTRAMUSCULAR | Status: AC
  Filled 2023-12-29: qty 30

## 2023-12-29 MED ORDER — LACTATED RINGERS IV SOLN: INTRAVENOUS | Status: DC

## 2023-12-29 MED ORDER — LIDOCAINE HCL (CARDIAC) PF 100 MG/5ML IV SOSY
PREFILLED_SYRINGE | INTRAVENOUS | Status: DC | PRN
  Administered 2023-12-29: 40 mg via INTRAVENOUS

## 2023-12-29 MED ORDER — CEFAZOLIN SODIUM-DEXTROSE 2-4 GM/100ML-% IV SOLN
INTRAVENOUS | Status: AC
  Filled 2023-12-29: qty 100

## 2023-12-29 MED ORDER — ACETAMINOPHEN 500 MG PO TABS
1000.0000 mg | ORAL_TABLET | Freq: Once | ORAL | Status: AC
  Administered 2023-12-29: 1000 mg via ORAL

## 2023-12-29 MED ORDER — LIDOCAINE HCL (PF) 1 % IJ SOLN
INTRAMUSCULAR | Status: DC | PRN
  Administered 2023-12-29: 5 mL

## 2023-12-29 MED ORDER — MIDAZOLAM HCL 2 MG/2ML IJ SOLN
INTRAMUSCULAR | Status: AC
  Filled 2023-12-29: qty 2

## 2023-12-29 MED ORDER — FENTANYL CITRATE (PF) 100 MCG/2ML IJ SOLN
INTRAMUSCULAR | Status: AC
Start: 2023-12-29 — End: ?
  Filled 2023-12-29: qty 2

## 2023-12-29 MED ORDER — CEFAZOLIN SODIUM-DEXTROSE 2-4 GM/100ML-% IV SOLN
2.0000 g | INTRAVENOUS | Status: AC
  Administered 2023-12-29: 2 g via INTRAVENOUS

## 2023-12-29 MED ORDER — MIDAZOLAM HCL 2 MG/2ML IJ SOLN
INTRAMUSCULAR | Status: DC | PRN
  Administered 2023-12-29: 1 mg via INTRAVENOUS

## 2023-12-29 MED ORDER — DEXMEDETOMIDINE HCL IN NACL 80 MCG/20ML IV SOLN
INTRAVENOUS | Status: DC | PRN
  Administered 2023-12-29: 4 ug via INTRAVENOUS

## 2023-12-29 MED ORDER — ONDANSETRON HCL 4 MG/2ML IJ SOLN
INTRAMUSCULAR | Status: AC
  Filled 2023-12-29: qty 2

## 2023-12-29 MED ORDER — PROPOFOL 500 MG/50ML IV EMUL
INTRAVENOUS | Status: AC
  Filled 2023-12-29: qty 50

## 2023-12-29 MED ORDER — BUPIVACAINE HCL (PF) 0.25 % IJ SOLN
INTRAMUSCULAR | Status: DC | PRN
  Administered 2023-12-29: 5 mL

## 2023-12-29 MED ORDER — PROPOFOL 10 MG/ML IV BOLUS
INTRAVENOUS | Status: DC | PRN
  Administered 2023-12-29: 40 mg via INTRAVENOUS

## 2023-12-29 MED ORDER — CELECOXIB 200 MG PO CAPS
ORAL_CAPSULE | ORAL | Status: AC
  Filled 2023-12-29: qty 1

## 2023-12-29 MED ORDER — FENTANYL CITRATE (PF) 100 MCG/2ML IJ SOLN
INTRAMUSCULAR | Status: DC | PRN
  Administered 2023-12-29: 50 ug via INTRAVENOUS

## 2023-12-29 MED ORDER — LIDOCAINE HCL (PF) 1 % IJ SOLN
INTRAMUSCULAR | Status: AC
  Filled 2023-12-29: qty 30

## 2023-12-29 MED ORDER — FENTANYL CITRATE (PF) 100 MCG/2ML IJ SOLN: 25.0000 ug | INTRAMUSCULAR | Status: DC | PRN

## 2023-12-29 SURGICAL SUPPLY — 46 items
BAND RUBBER #18 3X1/16 STRL (MISCELLANEOUS) ×1 IMPLANT
BLADE ARTHRO LOK 4 BEAVER (BLADE) IMPLANT
BLADE SURG 15 STRL LF DISP TIS (BLADE) ×2 IMPLANT
BNDG COHESIVE 1X5 TAN STRL LF (GAUZE/BANDAGES/DRESSINGS) IMPLANT
BNDG ELASTIC 2INX 5YD STR LF (GAUZE/BANDAGES/DRESSINGS) IMPLANT
BNDG ELASTIC 3INX 5YD STR LF (GAUZE/BANDAGES/DRESSINGS) IMPLANT
BNDG ESMARK 4X9 LF (GAUZE/BANDAGES/DRESSINGS) ×1 IMPLANT
BRUSH SCRUB EZ PLAIN DRY (MISCELLANEOUS) ×1 IMPLANT
CORD BIPOLAR FORCEPS 12FT (ELECTRODE) ×1 IMPLANT
COVER BACK TABLE 60X90IN (DRAPES) ×1 IMPLANT
COVER MAYO STAND STRL (DRAPES) ×1 IMPLANT
CUFF TOURN SGL QUICK 18X4 (TOURNIQUET CUFF) IMPLANT
DERMABOND ADVANCED .7 DNX12 (GAUZE/BANDAGES/DRESSINGS) IMPLANT
DRAPE EXTREMITY T 121X128X90 (DISPOSABLE) ×1 IMPLANT
DRAPE IMP U-DRAPE 54X76 (DRAPES) ×1 IMPLANT
DRAPE U-SHAPE 47X51 STRL (DRAPES) ×1 IMPLANT
GAUZE 4X4 16PLY ~~LOC~~+RFID DBL (SPONGE) IMPLANT
GAUZE SPONGE 4X4 12PLY STRL (GAUZE/BANDAGES/DRESSINGS) ×1 IMPLANT
GAUZE STRETCH 2X75IN STRL (MISCELLANEOUS) ×1 IMPLANT
GAUZE XEROFORM 1X8 LF (GAUZE/BANDAGES/DRESSINGS) ×1 IMPLANT
GLOVE BIOGEL PI IND STRL 7.5 (GLOVE) ×1 IMPLANT
GLOVE ECLIPSE 7.0 STRL STRAW (GLOVE) ×1 IMPLANT
GLOVE INDICATOR 7.0 STRL GRN (GLOVE) ×1 IMPLANT
GLOVE SURG SYN 7.5 E (GLOVE) ×2 IMPLANT
GLOVE SURG SYN 7.5 PF PI (GLOVE) ×2 IMPLANT
GOWN STRL REUS W/ TWL XL LVL3 (GOWN DISPOSABLE) ×1 IMPLANT
GOWN STRL SURGICAL XL XLNG (GOWN DISPOSABLE) ×2 IMPLANT
NDL HYPO 25X1 1.5 SAFETY (NEEDLE) IMPLANT
NEEDLE HYPO 25X1 1.5 SAFETY (NEEDLE) ×1 IMPLANT
NS IRRIG 1000ML POUR BTL (IV SOLUTION) IMPLANT
PACK BASIN DAY SURGERY FS (CUSTOM PROCEDURE TRAY) ×1 IMPLANT
SHEET MEDIUM DRAPE 40X70 STRL (DRAPES) ×1 IMPLANT
SPIKE FLUID TRANSFER (MISCELLANEOUS) IMPLANT
STOCKINETTE 4X48 STRL (DRAPES) IMPLANT
SUCTION TUBE FRAZIER 10FR DISP (SUCTIONS) ×1 IMPLANT
SUT ETHILON 2 0 FS 18 (SUTURE) IMPLANT
SUT ETHILON 3 0 PS 1 (SUTURE) ×1 IMPLANT
SUT ETHILON 4 0 PS 2 18 (SUTURE) ×1 IMPLANT
SUT VIC AB 0 CT1 27XBRD ANBCTR (SUTURE) IMPLANT
SUT VIC AB 2-0 CT1 TAPERPNT 27 (SUTURE) IMPLANT
SUT VIC AB 3-0 SH 27X BRD (SUTURE) ×1 IMPLANT
SYR BULB EAR ULCER 3OZ GRN STR (SYRINGE) IMPLANT
SYR CONTROL 10ML LL (SYRINGE) IMPLANT
TOWEL GREEN STERILE FF (TOWEL DISPOSABLE) ×1 IMPLANT
TRAY DSU PREP LF (CUSTOM PROCEDURE TRAY) ×1 IMPLANT
TUBE CONNECTING 20X1/4 (TUBING) ×1 IMPLANT

## 2023-12-29 NOTE — Transfer of Care (Signed)
 Immediate Anesthesia Transfer of Care Note  Patient: Emily Hayes  Procedure(s) Performed: EXCISION METACARPAL MASS (Right)  Patient Location: PACU  Anesthesia Type:MAC  Level of Consciousness: awake, alert , and oriented  Airway & Oxygen Therapy: Patient Spontanous Breathing  Post-op Assessment: Report given to RN and Post -op Vital signs reviewed and stable  Post vital signs: Reviewed and stable  Last Vitals:  Vitals Value Taken Time  BP    Temp    Pulse    Resp    SpO2      Last Pain:  Vitals:   12/29/23 1210  TempSrc: Oral  PainSc: 0-No pain      Patients Stated Pain Goal: 4 (12/29/23 1210)  Complications: No notable events documented.

## 2023-12-29 NOTE — H&P (Signed)
 PREOPERATIVE H&P  Chief Complaint: right thumb and long finger cysts  HPI: Emily Hayes is a 73 y.o. female who presents for surgical treatment of right thumb and long finger cysts.  She denies any changes in medical history.  Past Surgical History:  Procedure Laterality Date   ABCESS DRAINAGE Left 09/15/2017   behind tonsil   APPENDECTOMY     VAGINAL HYSTERECTOMY  1983   Social History   Socioeconomic History   Marital status: Divorced    Spouse name: Not on file   Number of children: 2   Years of education: Not on file   Highest education level: Associate degree: academic program  Occupational History   Occupation: retired  Tobacco Use   Smoking status: Every Day    Current packs/day: 0.50    Average packs/day: 0.5 packs/day for 50.0 years (25.0 ttl pk-yrs)    Types: Cigarettes   Smokeless tobacco: Never  Vaping Use   Vaping status: Never Used  Substance and Sexual Activity   Alcohol use: No   Drug use: No   Sexual activity: Not Currently    Birth control/protection: Surgical    Comment: Hyst  Other Topics Concern   Not on file  Social History Narrative   Social History      Diet? none      Do you drink/eat things with caffeine? Chocolate- occasionally      Marital status?        divorced                            What year were you married? 1982      Do you live in a house, apartment, assisted living, condo, trailer, etc.? apartment      Is it one or more stories? 1      How many persons live in your home? 1       Do you have any pets in your home? (please list) yes, dog      Highest level of education completed? Associate degree- college      Current or past profession: retired      Do you exercise?           yes                           Type & how often? Walk- daily      Advanced Directives      Do you have a living will? yes      Do you have a DNR form?     yes                             If not, do you want to discuss one? yes       Do you have signed POA/HPOA for forms? no      Functional Status      Do you have difficulty bathing or dressing yourself?      Do you have difficulty preparing food or eating?       Do you have difficulty managing your medications?      Do you have difficulty managing your finances?      Do you have difficulty affording your medications?   Social Drivers of Health   Financial Resource Strain: Low Risk  (10/01/2023)   Overall Financial Resource Strain (CARDIA)  Difficulty of Paying Living Expenses: Not very hard  Food Insecurity: No Food Insecurity (10/01/2023)   Hunger Vital Sign    Worried About Running Out of Food in the Last Year: Never true    Ran Out of Food in the Last Year: Never true  Transportation Needs: No Transportation Needs (10/01/2023)   PRAPARE - Administrator, Civil Service (Medical): No    Lack of Transportation (Non-Medical): No  Physical Activity: Sufficiently Active (10/01/2023)   Exercise Vital Sign    Days of Exercise per Week: 7 days    Minutes of Exercise per Session: 40 min  Stress: No Stress Concern Present (10/01/2023)   [employer] of Occupational Health - Occupational Stress Questionnaire    Feeling of Stress : Not at all  Social Connections: Socially Isolated (10/01/2023)   Social Connection and Isolation Panel [NHANES]    Frequency of Communication with Friends and Family: More than three times a week    Frequency of Social Gatherings with Friends and Family: Three times a week    Attends Religious Services: Never    Active Member of Clubs or Organizations: No    Attends Engineer, structural: Not on file    Marital Status: Divorced   Family History  Problem Relation Age of Onset   Stroke Mother    Diabetes Mother    Dementia Mother    Alzheimer's disease Mother    Colon polyps Brother    Hypertension Brother    Hyperlipidemia Brother    Cancer Sister        unknown   Cancer Sister        unknown    Kidney failure Brother    Cancer Sister        unknown   Lung cancer Brother    Cancer Sister        unknown   Colon cancer Neg Hx    Esophageal cancer Neg Hx    Rectal cancer Neg Hx    Stomach cancer Neg Hx    No Known Allergies Prior to Admission medications   Medication Sig Start Date End Date Taking? Authorizing Provider  acetaminophen (TYLENOL) 500 MG tablet Take 500 mg by mouth daily as needed.   Yes [provider]  Calcium Citrate-Vitamin D (CALCIUM CITRATE + D PO) Take 1 tablet by mouth 2 (two) times daily.   Yes [provider]  cyclobenzaprine (FLEXERIL) 10 MG tablet Take 1 tablet (10 mg total) by mouth 2 (two) times daily as needed for muscle spasms. 12/07/23  Yes Sharon Seller, NP  diphenhydrAMINE (BENADRYL) 25 MG tablet Take 25 mg by mouth as needed.    Yes [provider]  losartan (COZAAR) 25 MG tablet Take 1 tablet (25 mg total) by mouth daily. 12/07/23  Yes Sharon Seller, NP  Magnesium Carbonate, Antacid, (MAGNESIUM CARBONATE PO) Take 240 mg by mouth 2 (two) times daily.   Yes [provider]  Misc Natural Products (COLON CLEANSE) CAPS Take by mouth. As needed   Yes [provider]  rosuvastatin (CRESTOR) 20 MG tablet Take 1 tablet (20 mg total) by mouth daily. 12/07/23  Yes Sharon Seller, NP  vitamin E 400 UNIT capsule Take 1,200 Units by mouth daily.    Yes [provider]  ondansetron (ZOFRAN) 4 MG tablet Take 1 tablet (4 mg total) by mouth every 8 (eight) hours as needed for nausea or vomiting. 12/22/23   Cristie Hem, PA-C  Simethicone (  GAS RELIEF EXTRA STRENGTH PO) Take 1 capsule by mouth as needed.    [provider]     Positive ROS: All other systems have been reviewed and were otherwise negative with the exception of those mentioned in the HPI and as above.  Physical Exam: General: Alert, no acute distress Cardiovascular: No pedal edema Respiratory: No cyanosis, no use of  accessory musculature GI: abdomen soft Skin: No lesions in the area of chief complaint Neurologic: Sensation intact distally Psychiatric: Patient is competent for consent with normal mood and affect Lymphatic: no lymphedema  MUSCULOSKELETAL: exam stable  Assessment: right thumb and long finger cysts  Plan: Plan for Procedure(s): EXCISION METACARPAL MASS  The risks benefits and alternatives were discussed with the patient including but not limited to the risks of nonoperative treatment, versus surgical intervention including infection, bleeding, nerve injury,  blood clots, cardiopulmonary complications, morbidity, mortality, among others, and they were willing to proceed.   Glee Arvin, MD 12/29/2023 12:02 PM

## 2023-12-29 NOTE — Op Note (Signed)
 Date of Surgery: 12/29/2023  INDICATIONS: Ms. Nanez is a 73 y.o.-year-old female with ganglion cysts of flexor tendon sheath of the right long finger and thumb .  The patient did consent to the procedure after discussion of the risks and benefits.  PREOPERATIVE DIAGNOSIS:  Flexor tendon sheath ganglion cyst right long finger Flexor tendon sheath ganglion cyst right thumb  POSTOPERATIVE DIAGNOSIS: Same.  PROCEDURE:  Excision of ganglion cyst of flexor tendon sheath of right long finger Tenolysis of FDS tendon in the palm of right long finger Release of A1 pulley of right long finger Excision of ganglion cyst of flexor tendon sheath of right thumb Tenolysis of FPL tendon in the palm of right thumb Release of A1 pulley of right thumb  SURGEON: N. Glee Arvin, M.D.  ASSIST: Starlyn Skeans [place], [place]; necessary for the timely completion of procedure and due to complexity of procedure.  ANESTHESIA: MAC and local  IV FLUIDS AND URINE: See anesthesia.  ESTIMATED BLOOD LOSS: Minimal mL.  IMPLANTS: None  DRAINS: None  COMPLICATIONS: see description of procedure.  DESCRIPTION OF PROCEDURE: The patient was brought to the operating room.  The patient had been signed prior to the procedure and this was documented. The patient had the anesthesia placed by the anesthesiologist.  A time-out was performed to confirm that this was the correct patient, site, side and location. The patient did receive antibiotics prior to the incision and was re-dosed during the procedure as needed at indicated intervals.  A tourniquet was placed on the upper forearm.  The patient had the operative extremity prepped and draped in the standard surgical fashion.    The 2 ganglion cysts were palpated on the skin and the planned skin incision was marked.  A transverse incision was made in the distal flexor palmar crease in line with the long finger.  Dissection was carried down through the subcutaneous tissue onto  the flexor tendon sheath.  A small pea-sized ganglion cyst was encountered.  This originated from a defect in the middle of the A1 pulley.  The cyst was then excised entirely with tenotomy scissors and sent for pathology.  The A1 pulley was then released sharply with tenotomy scissors.  There was tenosynovitis of the FDS tendon.  Tenolysis was performed.  The tendon itself did not show any tendinosis.  We then turned our attention to the thumb mass.  A diagonal incision was made over the palpable cyst at the base of the thumb.  Dissection was carried down through the subcutaneous tissue onto the cyst.  The radial digital nerve was encountered and mobilized radially.  The cyst was punctured and classic cystic fluid was expressed.  We then continued our dissection of the cyst down onto the volar surface of the A1 pulley.  There was a small defect in the A1 pulley through which the cyst originated.  The cyst was removed and sent for pathology.  The A1 pulley was then released under direct visualization.  The oblique pulley was left intact.  There was mild tenosynovitis of the FPL tendon.  Tenolysis was performed.  The tendon itself did not show any tendinosis.  Surgical sites were then thoroughly irrigated and closed with 4-0 nylon.  Sterile dressings were applied.  Patient tolerated procedure well had no any complications.  Tessa Lerner was necessary for opening, closing, retracting, limb positioning and overall facilitation and timely completion of the procedure.  POSTOPERATIVE PLAN: Patient will be discharged home.  She will follow-up in the office  in about 7 to 10 days for suture removal.  N. Glee Arvin, MD 2:04 PM

## 2023-12-29 NOTE — Anesthesia Postprocedure Evaluation (Signed)
 Anesthesia Post Note  Patient: Emily Hayes  Procedure(s) Performed: EXCISION METACARPAL MASS (Right: Hand)     Patient location during evaluation: PACU Anesthesia Type: MAC Level of consciousness: awake and alert Pain management: pain level controlled Vital Signs Assessment: post-procedure vital signs reviewed and stable Respiratory status: spontaneous breathing, nonlabored ventilation, respiratory function stable and patient connected to nasal cannula oxygen Cardiovascular status: stable and blood pressure returned to baseline Postop Assessment: no apparent nausea or vomiting Anesthetic complications: no   No notable events documented.  Last Vitals:  Vitals:   12/29/23 1210 12/29/23 1421  BP: (!) 155/96 (!) 155/91  Pulse: (!) 103 87  Resp: 15 18  Temp: 36.9 C 36.7 C  SpO2: 100% 98%    Last Pain:  Vitals:   12/29/23 1421  TempSrc: Oral  PainSc: 0-No pain                 Earl Lites P Ashlye Oviedo

## 2023-12-29 NOTE — Discharge Instructions (Addendum)
 Postoperative instructions:  Keep your dressing and/or splint clean and dry at all times.  You can remove your dressing on post-operative day #3 and change with a dry/sterile dressing or Band-Aids as needed thereafter.    Incision instructions:  Do not soak your incision for 3 weeks after surgery.  If the incision gets wet, pat dry and do not scrub the incision.  Pain control:  You have been given a prescription to be taken as directed for post-operative pain control.  In addition, elevate the operative extremity above the heart at all times to prevent swelling and throbbing pain.  Take over-the-counter Colace, 100mg  by mouth twice a day while taking narcotic pain medications to help prevent constipation.  Follow up appointments: 1) 14 days for suture removal and wound check. 2) Dr. Roda Shutters as scheduled.   -------------------------------------------------------------------------------------------------------------  After Surgery Pain Control:  After your surgery, post-surgical discomfort or pain is likely. This discomfort can last several days to a few weeks. At certain times of the day your discomfort may be more intense.  Did you receive a nerve block?  A nerve block can provide pain relief for one hour to two days after your surgery. As long as the nerve block is working, you will experience little or no sensation in the area the surgeon operated on.  As the nerve block wears off, you will begin to experience pain or discomfort. It is very important that you begin taking your prescribed pain medication before the nerve block fully wears off. Treating your pain at the first sign of the block wearing off will ensure your pain is better controlled and more tolerable when full-sensation returns. Do not wait until the pain is intolerable, as the medicine will be less effective. It is better to treat pain in advance than to try and catch up.  General Anesthesia:  If you did not receive a nerve  block during your surgery, you will need to start taking your pain medication shortly after your surgery and should continue to do so as prescribed by your surgeon.  Pain Medication:  Most commonly we prescribe Vicodin and Percocet for post-operative pain. Both of these medications contain a combination of acetaminophen (Tylenol) and a narcotic to help control pain.   It takes between 30 and 45 minutes before pain medication starts to work. It is important to take your medication before your pain level gets too intense.   Nausea is a common side effect of many pain medications. You will want to eat something before taking your pain medicine to help prevent nausea.   If you are taking a prescription pain medication that contains acetaminophen, we recommend that you do not take additional over the counter acetaminophen (Tylenol).  Other pain relieving options:   Using a cold pack to ice the affected area a few times a day (15 to 20 minutes at a time) can help to relieve pain, reduce swelling and bruising.   Elevation of the affected area can also help to reduce pain and swelling.  Per [facility] clinic policy, our goal is ensure optimal postoperative pain control with a multimodal pain management strategy. For all [facility] patients, our goal is to wean post-operative narcotic medications by 6 weeks post-operatively. If this is not possible due to utilization of pain medication prior to surgery, your [facility] doctor will support your acute post-operative pain control for the first 6 weeks postoperatively, with a plan to transition you back to your primary pain team following that. [facility]  will work to ensure a smooth handoff.    Post Anesthesia Home Care Instructions  Activity: Get plenty of rest for the remainder of the day. A responsible individual must stay with you for 24 hours following the procedure.  For the next 24 hours, DO NOT: -Drive a car -Advertising copywriter -Drink alcoholic  beverages -Take any medication unless instructed by your physician -Make any legal decisions or sign important papers.  Meals: Start with liquid foods such as gelatin or soup. Progress to regular foods as tolerated. Avoid greasy, spicy, heavy foods. If nausea and/or vomiting occur, drink only clear liquids until the nausea and/or vomiting subsides. Call your physician if vomiting continues.  Special Instructions/Symptoms: Your throat may feel dry or sore from the anesthesia or the breathing tube placed in your throat during surgery. If this causes discomfort, gargle with warm salt water. The discomfort should disappear within 24 hours.  If you had a scopolamine patch placed behind your ear for the management of post- operative nausea and/or vomiting:  1. The medication in the patch is effective for 72 hours, after which it should be removed.  Wrap patch in a tissue and discard in the trash. Wash hands thoroughly with soap and water. 2. You may remove the patch earlier than 72 hours if you experience unpleasant side effects which may include dry mouth, dizziness or visual disturbances. 3. Avoid touching the patch. Wash your hands with soap and water after contact with the patch.     Last received tylenol at 1:30pm Last received celebrex at 1:30pm  No ibuprofen or tylenol until after 7:30pm

## 2023-12-29 NOTE — Anesthesia Preprocedure Evaluation (Signed)
 Anesthesia Evaluation  Patient identified by MRN, date of birth, ID band Patient awake    Reviewed: Allergy & Precautions, NPO status , Patient's Chart, lab work & pertinent test results  Airway Mallampati: II  TM Distance: >3 FB Neck ROM: Full    Dental no notable dental hx.    Pulmonary neg pulmonary ROS, Current Smoker   Pulmonary exam normal        Cardiovascular hypertension,  Rhythm:Regular Rate:Normal     Neuro/Psych negative neurological ROS  negative psych ROS   GI/Hepatic negative GI ROS, Neg liver ROS,,,  Endo/Other  negative endocrine ROS    Renal/GU negative Renal ROS  negative genitourinary   Musculoskeletal  (+) Arthritis , Osteoarthritis,    Abdominal Normal abdominal exam  (+)   Peds  Hematology negative hematology ROS (+)   Anesthesia Other Findings   Reproductive/Obstetrics                             Anesthesia Physical Anesthesia Plan  ASA: 2  Anesthesia Plan: MAC   Post-op Pain Management: Celebrex PO (pre-op)* and Tylenol PO (pre-op)*   Induction: Intravenous  PONV Risk Score and Plan: 2 and Ondansetron, Dexamethasone, Treatment may vary due to age or medical condition and Propofol infusion  Airway Management Planned: Simple Face Mask and Nasal Cannula  Additional Equipment: None  Intra-op Plan:   Post-operative Plan:   Informed Consent: I have reviewed the patients History and Physical, chart, labs and discussed the procedure including the risks, benefits and alternatives for the proposed anesthesia with the patient or authorized representative who has indicated his/her understanding and acceptance.     Dental advisory given  Plan Discussed with: CRNA  Anesthesia Plan Comments:        Anesthesia Quick Evaluation

## 2023-12-30 ENCOUNTER — Encounter (HOSPITAL_BASED_OUTPATIENT_CLINIC_OR_DEPARTMENT_OTHER): Payer: Self-pay | Admitting: Orthopaedic Surgery

## 2023-12-31 LAB — SURGICAL PATHOLOGY

## 2024-01-05 ENCOUNTER — Ambulatory Visit (INDEPENDENT_AMBULATORY_CARE_PROVIDER_SITE_OTHER): Admitting: Physician Assistant

## 2024-01-05 DIAGNOSIS — M25841 Other specified joint disorders, right hand: Secondary | ICD-10-CM

## 2024-01-05 NOTE — Progress Notes (Signed)
 Post-Op Visit Note   Patient: Emily Hayes           Date of Birth: 12/24/1950           MRN: [id number] Visit Date: 01/05/2024 PCP: Sharon Seller, NP   Assessment & Plan:  Chief Complaint: No chief complaint on file.  Visit Diagnoses:  1. Cyst of joint of right hand     Plan: Patient is a pleasant 73 year old female who comes in today 1 week status post ganglion cyst excision right long finger and right thumb, date of surgery 12/29/2023.  She has been doing well.  She has been taking Tylenol for pain as the Norco made her nauseous.  Surgical pathology came back which was consistent with ganglion cyst to both fingers.  Today, her wounds were cleaned and recovered.  No heavy lifting or submerging her hand underwater for another week.  Follow-up next week for suture removal.  Call with concerns or questions.  Follow-Up Instructions: Return in about 1 week (around 01/12/2024).   Orders:  No orders of the defined types were placed in this encounter.  No orders of the defined types were placed in this encounter.   Imaging: No results found.  PMFS History: Patient Active Problem List   Diagnosis Date Noted   Mass of finger of right hand 12/29/2023   Pain of right hand 10/21/2022   Abnormal mammogram of right breast 02/10/2019   Allergic rhinitis 12/30/2017   Osteopenia 12/30/2017   Osteoarthritis of multiple joints 12/30/2017   Mixed hyperlipidemia 12/30/2017   Past Medical History:  Diagnosis Date   Cervical radiculopathy    Fatigue    Generalized osteoarthrosis    HTN (hypertension)    Hyperlipidemia    Lateral epicondylitis of left elbow 06/28/2017   Left knee pain    Menopause    Neck pain    Pneumonia    Pre-diabetes    Sciatica    Swelling of left knee joint    Uterine cancer (HCC)     Family History  Problem Relation Age of Onset   Stroke Mother    Diabetes Mother    Dementia Mother    Alzheimer's disease Mother    Colon polyps Brother     Hypertension Brother    Hyperlipidemia Brother    Cancer Sister        unknown   Cancer Sister        unknown   Kidney failure Brother    Cancer Sister        unknown   Lung cancer Brother    Cancer Sister        unknown   Colon cancer Neg Hx    Esophageal cancer Neg Hx    Rectal cancer Neg Hx    Stomach cancer Neg Hx     Past Surgical History:  Procedure Laterality Date   ABCESS DRAINAGE Left 09/15/2017   behind tonsil   APPENDECTOMY     EXCISION METACARPAL MASS Right 12/29/2023   Procedure: EXCISION METACARPAL MASS;  Surgeon: Tarry Kos, MD;  Location: [facility];  Service: Orthopedics;  Laterality: Right;  right thumb and long finger cyst removal   VAGINAL HYSTERECTOMY  1983   Social History   Occupational History   Occupation: retired  Tobacco Use   Smoking status: Every Day    Current packs/day: 0.50    Average packs/day: 0.5 packs/day for 50.0 years (25.0 ttl pk-yrs)    Types: Cigarettes  Smokeless tobacco: Never  Vaping Use   Vaping status: Never Used  Substance and Sexual Activity   Alcohol use: No   Drug use: No   Sexual activity: Not Currently    Birth control/protection: Surgical    Comment: Hyst

## 2024-01-13 ENCOUNTER — Ambulatory Visit (INDEPENDENT_AMBULATORY_CARE_PROVIDER_SITE_OTHER): Admitting: Physician Assistant

## 2024-01-13 ENCOUNTER — Encounter: Payer: Self-pay | Admitting: Physician Assistant

## 2024-01-13 DIAGNOSIS — R2231 Localized swelling, mass and lump, right upper limb: Secondary | ICD-10-CM

## 2024-01-13 NOTE — Progress Notes (Signed)
 Post-Op Visit Note   Patient: Emily Hayes           Date of Birth: 1950-11-30           MRN: [id number] Visit Date: 01/13/2024 PCP: Sharon Seller, NP   Assessment & Plan:  Chief Complaint:  Chief Complaint  Patient presents with   Right Hand - Follow-up    S/p ganglion cyst excision right long finger and right thumb 12/29/2023   Visit Diagnoses:  1. Mass of finger of right hand     Plan: Patient is a pleasant 73 year old female who comes in today 2 weeks status post ganglion cyst excision right long finger and right thumb, date of surgery 12/29/2023.  She has been doing well.  She does have some soreness to the long finger in the morning as she sleeps with her hand on the side of her face.  This does improve throughout the day.  No other complaints.  Examination of the right hand reveals well-healed surgical incisions with nylon sutures in place.  No evidence of infection or cellulitis.  Fingers are warm well-perfused.  She is neurovascularly intact distally.  Today, sutures were removed and Steri-Strips applied.  No heavy lifting or submerging her hand underwater for 2 more weeks.  Follow-up in 2 weeks for recheck.  Call if concerns or questions.  Follow-Up Instructions: Return in about 2 weeks (around 01/27/2024).   Orders:  No orders of the defined types were placed in this encounter.  No orders of the defined types were placed in this encounter.   Imaging: No new imaging  PMFS History: Patient Active Problem List   Diagnosis Date Noted   Mass of finger of right hand 12/29/2023   Pain of right hand 10/21/2022   Abnormal mammogram of right breast 02/10/2019   Allergic rhinitis 12/30/2017   Osteopenia 12/30/2017   Osteoarthritis of multiple joints 12/30/2017   Mixed hyperlipidemia 12/30/2017   Past Medical History:  Diagnosis Date   Cervical radiculopathy    Fatigue    Generalized osteoarthrosis    HTN (hypertension)    Hyperlipidemia    Lateral  epicondylitis of left elbow 06/28/2017   Left knee pain    Menopause    Neck pain    Pneumonia    Pre-diabetes    Sciatica    Swelling of left knee joint    Uterine cancer (HCC)     Family History  Problem Relation Age of Onset   Stroke Mother    Diabetes Mother    Dementia Mother    Alzheimer's disease Mother    Colon polyps Brother    Hypertension Brother    Hyperlipidemia Brother    Cancer Sister        unknown   Cancer Sister        unknown   Kidney failure Brother    Cancer Sister        unknown   Lung cancer Brother    Cancer Sister        unknown   Colon cancer Neg Hx    Esophageal cancer Neg Hx    Rectal cancer Neg Hx    Stomach cancer Neg Hx     Past Surgical History:  Procedure Laterality Date   ABCESS DRAINAGE Left 09/15/2017   behind tonsil   APPENDECTOMY     EXCISION METACARPAL MASS Right 12/29/2023   Procedure: EXCISION METACARPAL MASS;  Surgeon: Tarry Kos, MD;  Location: [facility];  Service: Orthopedics;  Laterality: Right;  right thumb and long finger cyst removal   VAGINAL HYSTERECTOMY  1983   Social History   Occupational History   Occupation: retired  Tobacco Use   Smoking status: Every Day    Current packs/day: 0.50    Average packs/day: 0.5 packs/day for 50.0 years (25.0 ttl pk-yrs)    Types: Cigarettes   Smokeless tobacco: Never  Vaping Use   Vaping status: Never Used  Substance and Sexual Activity   Alcohol use: No   Drug use: No   Sexual activity: Not Currently    Birth control/protection: Surgical    Comment: Hyst

## 2024-01-26 ENCOUNTER — Other Ambulatory Visit: Payer: Medicare HMO

## 2024-01-26 DIAGNOSIS — E782 Mixed hyperlipidemia: Secondary | ICD-10-CM

## 2024-01-26 DIAGNOSIS — I10 Essential (primary) hypertension: Secondary | ICD-10-CM

## 2024-01-26 DIAGNOSIS — E785 Hyperlipidemia, unspecified: Secondary | ICD-10-CM | POA: Diagnosis not present

## 2024-01-26 DIAGNOSIS — E1169 Type 2 diabetes mellitus with other specified complication: Secondary | ICD-10-CM | POA: Diagnosis not present

## 2024-01-27 ENCOUNTER — Encounter: Payer: Self-pay | Admitting: Physician Assistant

## 2024-01-27 ENCOUNTER — Ambulatory Visit: Admitting: Physician Assistant

## 2024-01-27 DIAGNOSIS — R2231 Localized swelling, mass and lump, right upper limb: Secondary | ICD-10-CM

## 2024-01-27 LAB — LIPID PANEL
Cholesterol: 207 mg/dL — ABNORMAL HIGH (ref <200)
HDL: 65 mg/dL (ref >=50)
LDL Cholesterol (Calc): 118 mg/dL — ABNORMAL HIGH
Non-HDL Cholesterol (Calc): 142 mg/dL — ABNORMAL HIGH (ref <130)
Total CHOL/HDL Ratio: 3.2 (calc) (ref <5.0)
Triglycerides: 126 mg/dL (ref <150)

## 2024-01-27 LAB — COMPLETE METABOLIC PANEL WITHOUT GFR
AG Ratio: 1.9 (calc) (ref 1.0–2.5)
ALT: 9 U/L (ref 6–29)
AST: 16 U/L (ref 10–35)
Albumin: 4.5 g/dL (ref 3.6–5.1)
Alkaline phosphatase (APISO): 55 U/L (ref 37–153)
BUN: 15 mg/dL (ref 7–25)
CO2: 29 mmol/L (ref 20–32)
Calcium: 9.4 mg/dL (ref 8.6–10.4)
Chloride: 103 mmol/L (ref 98–110)
Creat: 0.7 mg/dL (ref 0.60–1.00)
Globulin: 2.4 g/dL (ref 1.9–3.7)
Glucose, Bld: 100 mg/dL — ABNORMAL HIGH (ref 65–99)
Potassium: 4.3 mmol/L (ref 3.5–5.3)
Sodium: 139 mmol/L (ref 135–146)
Total Bilirubin: 0.4 mg/dL (ref 0.2–1.2)
Total Protein: 6.9 g/dL (ref 6.1–8.1)

## 2024-01-27 LAB — CBC WITH DIFFERENTIAL/PLATELET
Absolute Lymphocytes: 2652 {cells}/uL (ref 850–3900)
Absolute Monocytes: 486 {cells}/uL (ref 200–950)
Basophils Absolute: 72 {cells}/uL (ref 0–200)
Basophils Relative: 1.2 %
Eosinophils Absolute: 78 {cells}/uL (ref 15–500)
Eosinophils Relative: 1.3 %
HCT: 39.3 % (ref 35.0–45.0)
Hemoglobin: 12.3 g/dL (ref 11.7–15.5)
MCH: 26.9 pg — ABNORMAL LOW (ref 27.0–33.0)
MCHC: 31.3 g/dL — ABNORMAL LOW (ref 32.0–36.0)
MCV: 86 fL (ref 80.0–100.0)
MPV: 11.8 fL (ref 7.5–12.5)
Monocytes Relative: 8.1 %
Neutro Abs: 2712 {cells}/uL (ref 1500–7800)
Neutrophils Relative %: 45.2 %
Platelets: 277 10*3/uL (ref 140–400)
RBC: 4.57 10*6/uL (ref 3.80–5.10)
RDW: 13.8 % (ref 11.0–15.0)
Total Lymphocyte: 44.2 %
WBC: 6 10*3/uL (ref 3.8–10.8)

## 2024-01-27 LAB — HEMOGLOBIN A1C
Hgb A1c MFr Bld: 7 %{Hb} — ABNORMAL HIGH (ref <5.7)
Mean Plasma Glucose: 154 mg/dL
eAG (mmol/L): 8.5 mmol/L

## 2024-01-27 NOTE — Progress Notes (Signed)
 Post-Op Visit Note   Patient: Emily Hayes           Date of Birth: 06-06-1951           MRN: [id number] Visit Date: 01/27/2024 PCP: Sharon Seller, NP   Assessment & Plan:  Chief Complaint:  Chief Complaint  Patient presents with   Right Hand - Follow-up    S/p ganglion cyst excision right long finger and right thumb 12/29/2023   Visit Diagnoses:  1. Mass of finger of right hand     Plan: Patient is a pleasant 73 year old female who comes in today 4 weeks status post ganglion cyst excision right long finger and right thumb, date of surgery 12/29/2023.  She has been doing well.  No pain.  She does still have some swelling to the long finger.  She has been taking NSAIDs for these.  Examination of her right hand reveals well-healing surgical incisions.  At this point, recommended applying Neosporin or Vaseline to the wounds and cover with a Band-Aid twice daily.  She will continue with range of motion exercises.  Follow-up as needed.  Call with concerns or questions.  Follow-Up Instructions: Return if symptoms worsen or fail to improve.   Orders:  No orders of the defined types were placed in this encounter.  No orders of the defined types were placed in this encounter.   Imaging: No new imaging  PMFS History: Patient Active Problem List   Diagnosis Date Noted   Mass of finger of right hand 12/29/2023   Pain of right hand 10/21/2022   Abnormal mammogram of right breast 02/10/2019   Allergic rhinitis 12/30/2017   Osteopenia 12/30/2017   Osteoarthritis of multiple joints 12/30/2017   Mixed hyperlipidemia 12/30/2017   Past Medical History:  Diagnosis Date   Cervical radiculopathy    Fatigue    Generalized osteoarthrosis    HTN (hypertension)    Hyperlipidemia    Lateral epicondylitis of left elbow 06/28/2017   Left knee pain    Menopause    Neck pain    Pneumonia    Pre-diabetes    Sciatica    Swelling of left knee joint    Uterine cancer (HCC)      Family History  Problem Relation Age of Onset   Stroke Mother    Diabetes Mother    Dementia Mother    Alzheimer's disease Mother    Colon polyps Brother    Hypertension Brother    Hyperlipidemia Brother    Cancer Sister        unknown   Cancer Sister        unknown   Kidney failure Brother    Cancer Sister        unknown   Lung cancer Brother    Cancer Sister        unknown   Colon cancer Neg Hx    Esophageal cancer Neg Hx    Rectal cancer Neg Hx    Stomach cancer Neg Hx     Past Surgical History:  Procedure Laterality Date   ABCESS DRAINAGE Left 09/15/2017   behind tonsil   APPENDECTOMY     EXCISION METACARPAL MASS Right 12/29/2023   Procedure: EXCISION METACARPAL MASS;  Surgeon: Tarry Kos, MD;  Location: [facility];  Service: Orthopedics;  Laterality: Right;  right thumb and long finger cyst removal   VAGINAL HYSTERECTOMY  1983   Social History   Occupational History   Occupation: retired  Tobacco Use   Smoking status: Every Day    Current packs/day: 0.50    Average packs/day: 0.5 packs/day for 50.0 years (25.0 ttl pk-yrs)    Types: Cigarettes   Smokeless tobacco: Never  Vaping Use   Vaping status: Never Used  Substance and Sexual Activity   Alcohol use: No   Drug use: No   Sexual activity: Not Currently    Birth control/protection: Surgical    Comment: Hyst

## 2024-01-31 ENCOUNTER — Encounter: Payer: Self-pay | Admitting: Nurse Practitioner

## 2024-01-31 ENCOUNTER — Ambulatory Visit: Payer: Medicare HMO | Admitting: Nurse Practitioner

## 2024-01-31 VITALS — BP 134/88 | HR 96 | Temp 97.1°F | Ht 66.0 in | Wt 129.4 lb

## 2024-01-31 DIAGNOSIS — M8580 Other specified disorders of bone density and structure, unspecified site: Secondary | ICD-10-CM

## 2024-01-31 DIAGNOSIS — E785 Hyperlipidemia, unspecified: Secondary | ICD-10-CM | POA: Diagnosis not present

## 2024-01-31 DIAGNOSIS — E2839 Other primary ovarian failure: Secondary | ICD-10-CM

## 2024-01-31 DIAGNOSIS — E1169 Type 2 diabetes mellitus with other specified complication: Secondary | ICD-10-CM | POA: Diagnosis not present

## 2024-01-31 DIAGNOSIS — I10 Essential (primary) hypertension: Secondary | ICD-10-CM

## 2024-01-31 DIAGNOSIS — E782 Mixed hyperlipidemia: Secondary | ICD-10-CM

## 2024-01-31 MED ORDER — ROSUVASTATIN CALCIUM 20 MG PO TABS: 30.0000 mg | ORAL_TABLET | Freq: Every day | ORAL | Status: DC

## 2024-01-31 NOTE — Progress Notes (Signed)
 Careteam: Patient Care Team: Sharon Seller, NP as PCP - General (Geriatric Medicine) Ander Purpura, OD (Optometry)  PLACE OF SERVICE:  [facility]  Advanced Directive information    No Known Allergies  Chief Complaint  Patient presents with   Medical Management of Chronic Issues    4 month follow-up with foot exam. Discussed need for DEXA and eye exam (patient plans to schedule)     Discussed the use of AI scribe software for clinical note transcription with the patient, who gave verbal consent to proceed.  History of Present Illness   Emily Hayes is a 73 year old female who presents for follow-up  She underwent a ganglion cyst excision on her right hand on December 29, 2023. Post-surgery, the surgical site is dry and crusty with some puffiness in the long finger. She applies Neosporin and Vaseline and avoids water exposure to prevent infection. She has had three follow-up visits with the orthopedic surgeon and is now on a call-as-needed basis for further follow-up.  She has a history of diabetes with her A1c currently at 7.0, up from 6.5 a year ago. She is actively working on dietary changes, such as reducing pasta and sweets, and using agave in her coffee. She has been making smoothies, which she enjoyed using oat milk. Her fasting glucose is at 100 mg/dL on labs.   She is managing hyperlipidemia and is currently taking Crestor 20 mg daily without side effects. Her cholesterol level is at 118 mg/dL, above the target of less than 70 mg/dL.  She uses Losartan for kidney protection due to diabetes, despite having normal blood pressure. She is concerned about the impact of her medications and dietary changes on her weight, as she aims to maintain a healthy BMI.       Review of Systems:  Review of Systems  Constitutional:  Negative for chills, fever and weight loss.  HENT:  Negative for tinnitus.   Respiratory:  Negative for cough, sputum production and shortness of  breath.   Cardiovascular:  Negative for chest pain, palpitations and leg swelling.  Gastrointestinal:  Negative for abdominal pain, constipation, diarrhea and heartburn.  Genitourinary:  Negative for dysuria, frequency and urgency.  Musculoskeletal:  Negative for back pain, falls, joint pain and myalgias.  Skin: Negative.   Neurological:  Negative for dizziness and headaches.  Psychiatric/Behavioral:  Negative for depression and memory loss. The patient does not have insomnia.     Past Medical History:  Diagnosis Date   Cervical radiculopathy    Fatigue    Generalized osteoarthrosis    HTN (hypertension)    Hyperlipidemia    Lateral epicondylitis of left elbow 06/28/2017   Left knee pain    Menopause    Neck pain    Pneumonia    Pre-diabetes    Sciatica    Swelling of left knee joint    Uterine cancer [facility])    Past Surgical History:  Procedure Laterality Date   ABCESS DRAINAGE Left 09/15/2017   behind tonsil   APPENDECTOMY     EXCISION METACARPAL MASS Right 12/29/2023   Procedure: EXCISION METACARPAL MASS;  Surgeon: Tarry Kos, MD;  Location: [facility];  Service: Orthopedics;  Laterality: Right;  right thumb and long finger cyst removal   VAGINAL HYSTERECTOMY  1983   Social History:   reports that she has been smoking cigarettes. She has a 25 pack-year smoking history. She has never used smokeless tobacco. She reports that she  does not drink alcohol and does not use drugs.  Family History  Problem Relation Age of Onset   Stroke Mother    Diabetes Mother    Dementia Mother    Alzheimer's disease Mother    Colon polyps Brother    Hypertension Brother    Hyperlipidemia Brother    Cancer Sister        unknown   Cancer Sister        unknown   Kidney failure Brother    Cancer Sister        unknown   Lung cancer Brother    Cancer Sister        unknown   Colon cancer Neg Hx    Esophageal cancer Neg Hx    Rectal cancer Neg Hx    Stomach cancer Neg  Hx     Medications: Patient's Medications  New Prescriptions   No medications on file  Previous Medications   ACETAMINOPHEN (TYLENOL) 500 MG TABLET    Take 500 mg by mouth daily as needed.   CALCIUM CITRATE-VITAMIN D (CALCIUM CITRATE + D PO)    Take 1 tablet by mouth 2 (two) times daily.   CYCLOBENZAPRINE (FLEXERIL) 10 MG TABLET    Take 1 tablet (10 mg total) by mouth 2 (two) times daily as needed for muscle spasms.   DIPHENHYDRAMINE (BENADRYL) 25 MG TABLET    Take 25 mg by mouth as needed.    LOSARTAN (COZAAR) 25 MG TABLET    Take 1 tablet (25 mg total) by mouth daily.   MAGNESIUM CARBONATE, ANTACID, (MAGNESIUM CARBONATE PO)    Take 240 mg by mouth 2 (two) times daily.   MISC NATURAL PRODUCTS (COLON CLEANSE) CAPS    Take by mouth. As needed   SIMETHICONE (GAS RELIEF EXTRA STRENGTH PO)    Take 1 capsule by mouth as needed.   VITAMIN E 400 UNIT CAPSULE    Take 1,200 Units by mouth daily.   Modified Medications   Modified Medication Previous Medication   ROSUVASTATIN (CRESTOR) 20 MG TABLET rosuvastatin (CRESTOR) 20 MG tablet      Take 1.5 tablets (30 mg total) by mouth daily.    Take 1 tablet (20 mg total) by mouth daily.  Discontinued Medications   ONDANSETRON (ZOFRAN) 4 MG TABLET    Take 1 tablet (4 mg total) by mouth every 8 (eight) hours as needed for nausea or vomiting.    Physical Exam:  Vitals:   01/31/24 0735  BP: 134/88  Pulse: 96  Temp: (!) 97.1 F (36.2 C)  SpO2: 100%  Weight: 129 lb 6.4 oz (58.7 kg)  Height: 5\' 6"  (1.676 m)   Body mass index is 20.89 kg/m. Wt Readings from Last 3 Encounters:  01/31/24 129 lb 6.4 oz (58.7 kg)  12/29/23 125 lb 10.6 oz (57 kg)  12/13/23 128 lb 6.4 oz (58.2 kg)    Physical Exam Constitutional:      General: She is not in acute distress.    Appearance: She is well-developed. She is not diaphoretic.  HENT:     Head: Normocephalic and atraumatic.     Mouth/Throat:     Pharynx: No oropharyngeal exudate.  Eyes:      Conjunctiva/sclera: Conjunctivae normal.     Pupils: Pupils are equal, round, and reactive to light.  Cardiovascular:     Rate and Rhythm: Normal rate and regular rhythm.     Heart sounds: Normal heart sounds.  Pulmonary:     Effort: Pulmonary  effort is normal.     Breath sounds: Normal breath sounds.  Abdominal:     General: Bowel sounds are normal.     Palpations: Abdomen is soft.  Musculoskeletal:     Cervical back: Normal range of motion and neck supple.     Right lower leg: No edema.     Left lower leg: No edema.  Skin:    General: Skin is warm and dry.  Neurological:     Mental Status: She is alert.  Psychiatric:        Mood and Affect: Mood normal.     Labs reviewed: Basic Metabolic Panel: Recent Labs    06/28/23 0940 09/27/23 0815 01/26/24 0820  NA 139 140 139  K 4.4 4.3 4.3  CL 102 102 103  CO2 29 30 29   GLUCOSE 100* 97 100*  BUN 13 15 15   CREATININE 0.73 0.76 0.70  CALCIUM 10.3 9.9 9.4   Liver Function Tests: Recent Labs    06/28/23 0940 09/27/23 0815 01/26/24 0820  AST 15 16 16   ALT 11 11 9   BILITOT 0.4 0.4 0.4  PROT 7.3 7.0 6.9   No results for input(s): "LIPASE", "AMYLASE" in the last 8760 hours. No results for input(s): "AMMONIA" in the last 8760 hours. CBC: Recent Labs    06/28/23 0940 01/26/24 0820  WBC 5.5 6.0  NEUTROABS 2,459 2,712  HGB 12.5 12.3  HCT 39.6 39.3  MCV 84.1 86.0  PLT 267 277   Lipid Panel: Recent Labs    06/28/23 0940 09/27/23 0815 01/26/24 0820  CHOL 207* 208* 207*  HDL 62 64 65  LDLCALC 123* 121* 118*  TRIG 108 122 126  CHOLHDL 3.3 3.3 3.2   TSH: No results for input(s): "TSH" in the last 8760 hours. A1C: Lab Results  Component Value Date   HGBA1C 7.0 (H) 01/26/2024     Assessment/Plan 1. Mixed hyperlipidemia Not at goal, willing to increase Crestor to 30 mg daily and continue to work on dietary modificatons - Lipid panel; Future - rosuvastatin (CRESTOR) 20 MG tablet; Take 1.5 tablets (30 mg  total) by mouth daily.  2. Estrogen deficiency (Primary) - DG Bone Density  3. Type 2 diabetes mellitus with hyperlipidemia (HCC) -Encouraged dietary compliance, routine foot care/monitoring and to keep up with diabetic eye exams through ophthalmology  -goal <7 at 7 on last labs - Hemoglobin A1c; Future  4. Primary hypertension -Blood pressure well controlled, goal bp <140/90 Continue current medications and dietary modifications follow metabolic panel - COMPLETE METABOLIC PANEL WITHOUT GFR; Future - CBC with Differential/Platelet; Future  5. Osteopenia, unspecified location -Recommended to take calcium 600 mg twice daily with Vitamin D 2000 units daily and weight bearing activity 30 mins/5 days a week  Return in about 4 months (around 06/01/2024) for routine follow up, labs prior to visit.  Dasha Kawabata K. Denney Fisherman [facility] [phone]

## 2024-01-31 NOTE — Patient Instructions (Addendum)
 STOP smoothies  Do not blend fruit, chew it  2-3 servings a day    Increase crestor to 1.5 tablet daily

## 2024-02-02 ENCOUNTER — Other Ambulatory Visit: Payer: Medicare HMO

## 2024-03-21 DIAGNOSIS — H43393 Other vitreous opacities, bilateral: Secondary | ICD-10-CM | POA: Diagnosis not present

## 2024-05-04 ENCOUNTER — Ambulatory Visit: Payer: Self-pay

## 2024-05-04 NOTE — Telephone Encounter (Signed)
 FYI Only or Action Required?: FYI only for provider.  Patient was last seen in primary care on 01/31/2024 by Caro Harlene POUR, NP.  Called Nurse Triage reporting Tingling and Sciatica.  Symptoms began several weeks ago.  Interventions attempted: Rest, hydration, or home remedies.  Symptoms are: unchanged.  Triage Disposition: See PCP When Office is Open (Within 3 Days)  Patient/caregiver understands and will follow disposition?: Yes      Copied from CRM [id number]. Topic: Clinical - Red Word Triage >> May 04, 2024  4:28 PM Merlynn A wrote: Red Word that prompted transfer to Nurse Triage: Left foot tingling, up to thigh and left hip Reason for Disposition  [1] Pain radiates into the thigh or further down the leg AND [2] one leg  Answer Assessment - Initial Assessment Questions 1. ONSET: When did the pain start?      X 2 weeks 2. LOCATION: Where is the pain located?      L foot tingling that radiates to thigh and hip 3. PAIN: How bad is the pain?    (Scale 1-10; or mild, moderate, severe)     More annoying - endorses a slight pain 4. WORK OR EXERCISE: Has there been any recent work or exercise that involved this part of the body?      denies 5. CAUSE: What do you think is causing the foot pain?     Unknown It feels like a sciatica flare up 6. OTHER SYMPTOMS: Do you have any other symptoms? (e.g., leg pain, rash, fever, numbness)     denies 7. PREGNANCY: Is there any chance you are pregnant? When was your last menstrual period?     N/a  Answer Assessment - Initial Assessment Questions 1. ONSET: When did the pain begin? (e.g., minutes, hours, days)     See alternate answer assessment 2. LOCATION: Where does it hurt? (upper, mid or lower back)     See alternate answer assessment 3. SEVERITY: How bad is the pain?  (e.g., Scale 1-10; mild, moderate, or severe)     More discomfort  4. PATTERN: Is the pain constant? (e.g., yes, no; constant,  intermittent)      Tingling in feet constant 5. RADIATION: Does the pain shoot into your legs or somewhere else?     Shoot to thigh/hip 6. CAUSE:  What do you think is causing the back pain?      Possible sciatica flare 7. BACK OVERUSE:  Any recent lifting of heavy objects, strenuous work or exercise?     denies 8. MEDICINES: What have you taken so far for the pain? (e.g., nothing, acetaminophen , NSAIDS)     Tylenol  with some relief 9. NEUROLOGIC SYMPTOMS: Do you have any weakness, numbness, or problems with bowel/bladder control?     denies 10. OTHER SYMPTOMS: Do you have any other symptoms? (e.g., fever, abdomen pain, burning with urination, blood in urine)       denies 11. PREGNANCY: Is there any chance you are pregnant? When was your last menstrual period?       N/a  Protocols used: Foot Pain-A-AH, Back Pain-A-AH

## 2024-05-05 ENCOUNTER — Encounter: Payer: Self-pay | Admitting: Adult Health

## 2024-05-05 ENCOUNTER — Ambulatory Visit (INDEPENDENT_AMBULATORY_CARE_PROVIDER_SITE_OTHER): Admitting: Adult Health

## 2024-05-05 VITALS — BP 125/75 | HR 99 | Temp 97.6°F | Resp 18 | Ht 66.0 in | Wt 127.6 lb

## 2024-05-05 DIAGNOSIS — G609 Hereditary and idiopathic neuropathy, unspecified: Secondary | ICD-10-CM

## 2024-05-05 DIAGNOSIS — E782 Mixed hyperlipidemia: Secondary | ICD-10-CM | POA: Diagnosis not present

## 2024-05-05 DIAGNOSIS — I10 Essential (primary) hypertension: Secondary | ICD-10-CM

## 2024-05-05 MED ORDER — GABAPENTIN 100 MG PO CAPS: 100.0000 mg | ORAL_CAPSULE | Freq: Every day | ORAL | 1 refills | Status: DC | PRN

## 2024-05-05 NOTE — Progress Notes (Signed)
 [facility]  Provider:  Jereld Serum DNP  Code Status:  Full Code  Goals of Care:     12/24/2023   11:33 AM  Advanced Directives  Does Patient Have a Medical Advance Directive? Yes  Type of Estate agent of [place];Living will     Chief Complaint  Patient presents with   Foot Swelling    L feet tingling     Discussed the use of AI scribe software for clinical note transcription with the patient, who gave verbal consent to proceed.  HPI: Patient is a 73 y.o. female seen today for an acute visit for left lower extremity tingling.  She has been experiencing tingling in her left foot that radiates up her leg, thigh, and into her hip for about two weeks. The sensation occurs all day without relief and is described as uncomfortable but not painful.  She has a history of sciatica with annual flare-ups severe enough to require emergency room visits, although she has not experienced a flare-up this year.  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 She smokes, with a pack lasting her a day and a half, and does not consume alcohol. She is retired and only drives when necessary.       Past Medical History:  Diagnosis Date   Cervical radiculopathy    Fatigue    Generalized osteoarthrosis    HTN (hypertension)    Hyperlipidemia    Lateral epicondylitis of left elbow 06/28/2017   Left knee pain    Menopause    Neck pain    Pneumonia    Pre-diabetes    Sciatica    Swelling of left knee joint    Uterine cancer [facility])     Past Surgical History:  Procedure Laterality Date   ABCESS DRAINAGE Left 09/15/2017   behind tonsil   APPENDECTOMY     EXCISION METACARPAL MASS Right 12/29/2023   Procedure: EXCISION METACARPAL MASS;  Surgeon: Jerri Kay HERO, MD;  Location: [facility];  Service: Orthopedics;  Laterality: Right;  right thumb and long finger cyst removal   VAGINAL HYSTERECTOMY  1983    No Known  Allergies  Outpatient Encounter Medications as of 05/05/2024  Medication Sig   acetaminophen  (TYLENOL ) 500 MG tablet Take 500 mg by mouth daily as needed.   Calcium  Citrate-Vitamin D (CALCIUM  CITRATE + D PO) Take 1 tablet by mouth 2 (two) times daily.   cyclobenzaprine  (FLEXERIL ) 10 MG tablet Take 1 tablet (10 mg total) by mouth 2 (two) times daily as needed for muscle spasms.   diphenhydrAMINE (BENADRYL) 25 MG tablet Take 25 mg by mouth as needed.    gabapentin (NEURONTIN) 100 MG capsule Take 1 capsule (100 mg total) by mouth daily as needed.   losartan  (COZAAR ) 25 MG tablet Take 1 tablet (25 mg total) by mouth daily.   Magnesium Carbonate, Antacid, (MAGNESIUM CARBONATE PO) Take 240 mg by mouth 2 (two) times daily.   Misc Natural Products (COLON CLEANSE) CAPS Take by mouth. As needed   rosuvastatin  (CRESTOR ) 20 MG tablet Take 1.5 tablets (30 mg total) by mouth daily.   Simethicone (GAS RELIEF EXTRA STRENGTH PO) Take 1 capsule by mouth as needed.   vitamin E 400 UNIT capsule Take 1,200 Units by mouth daily.    No facility-administered encounter medications on file as of 05/05/2024.    Review of Systems:  Review of Systems  Constitutional:  Negative for appetite change, chills, fatigue  and fever.  HENT:  Negative for congestion, hearing loss, rhinorrhea and sore throat.   Eyes: Negative.   Respiratory:  Negative for cough, shortness of breath and wheezing.   Cardiovascular:  Negative for chest pain, palpitations and leg swelling.  Gastrointestinal:  Negative for abdominal pain, constipation, diarrhea, nausea and vomiting.  Genitourinary:  Negative for dysuria.  Musculoskeletal:  Negative for arthralgias, back pain and myalgias.  Skin:  Negative for color change, rash and wound.  Neurological:  Negative for dizziness, weakness and headaches.       Tingling on left lower extremity  Psychiatric/Behavioral:  Negative for behavioral problems. The patient is not nervous/anxious.     Health  Maintenance  Topic Date Due   OPHTHALMOLOGY EXAM  11/11/2023   DEXA SCAN  11/27/2023   COVID-19 Vaccine (8 - 2024-25 season) 03/19/2024   Medicare Annual Wellness (AWV)  06/27/2024   Pneumococcal Vaccine: 50+ Years (1 of 2 - PCV) 10/19/2048 (Originally 08/11/1970)   Diabetic kidney evaluation - Urine ACR  06/27/2024   HEMOGLOBIN A1C  07/27/2024   Diabetic kidney evaluation - eGFR measurement  01/25/2025   FOOT EXAM  01/30/2025   MAMMOGRAM  07/05/2025   Colonoscopy  01/02/2031   DTaP/Tdap/Td (3 - Td or Tdap) 09/18/2033   Hepatitis C Screening  Completed   Zoster Vaccines- Shingrix  Completed   Hepatitis B Vaccines  Aged Out   HPV VACCINES  Aged Out   Meningococcal B Vaccine  Aged Out   Lung Cancer Screening  Discontinued   INFLUENZA VACCINE  Discontinued   Fecal DNA (Cologuard)  Discontinued    Physical Exam: Vitals:   05/05/24 1036  BP: 125/75  Pulse: 99  Resp: 18  Temp: 97.6 F (36.4 C)  SpO2: 99%  Weight: 127 lb 9.6 oz (57.9 kg)  Height: 5' 6 (1.676 m)   Body mass index is 20.6 kg/m. Physical Exam Constitutional:      Appearance: Normal appearance.  HENT:     Head: Normocephalic and atraumatic.     Nose: Nose normal.     Mouth/Throat:     Mouth: Mucous membranes are moist.  Eyes:     Conjunctiva/sclera: Conjunctivae normal.  Cardiovascular:     Rate and Rhythm: Normal rate and regular rhythm.  Pulmonary:     Effort: Pulmonary effort is normal.     Breath sounds: Normal breath sounds.  Abdominal:     General: Bowel sounds are normal.     Palpations: Abdomen is soft.  Musculoskeletal:        General: Normal range of motion.     Cervical back: Normal range of motion.  Skin:    General: Skin is warm and dry.  Neurological:     General: No focal deficit present.     Mental Status: She is alert and oriented to person, place, and time.  Psychiatric:        Mood and Affect: Mood normal.        Behavior: Behavior normal.        Thought Content: Thought  content normal.        Judgment: Judgment normal.     Labs reviewed: Basic Metabolic Panel: Recent Labs    06/28/23 0940 09/27/23 0815 01/26/24 0820  NA 139 140 139  K 4.4 4.3 4.3  CL 102 102 103  CO2 29 30 29   GLUCOSE 100* 97 100*  BUN 13 15 15   CREATININE 0.73 0.76 0.70  CALCIUM  10.3 9.9 9.4   Liver  Function Tests: Recent Labs    06/28/23 0940 09/27/23 0815 01/26/24 0820  AST 15 16 16   ALT 11 11 9   BILITOT 0.4 0.4 0.4  PROT 7.3 7.0 6.9   No results for input(s): LIPASE, AMYLASE in the last 8760 hours. No results for input(s): AMMONIA in the last 8760 hours. CBC: Recent Labs    06/28/23 0940 01/26/24 0820  WBC 5.5 6.0  NEUTROABS 2,459 2,712  HGB 12.5 12.3  HCT 39.6 39.3  MCV 84.1 86.0  PLT 267 277   Lipid Panel: Recent Labs    06/28/23 0940 09/27/23 0815 01/26/24 0820  CHOL 207* 208* 207*  HDL 62 64 65  LDLCALC 123* 121* 118*  TRIG 108 122 126  CHOLHDL 3.3 3.3 3.2   Lab Results  Component Value Date   HGBA1C 7.0 (H) 01/26/2024    Procedures since last visit: No results found.  Assessment/Plan  1. Idiopathic peripheral neuropathy (Primary) -  Suspected peripheral neuropathy with tingling in left foot and leg. Vitamin B12 deficiency considered. Gabapentin proposed for symptom management. - Order vitamin B12 level test. - Prescribe gabapentin 100 mg as needed for tingling. - Educated about gabapentin side effects: drowsiness, grogginess. - Advised taking gabapentin at night. - Vitamin B12; Future - gabapentin (NEURONTIN) 100 MG capsule; Take 1 capsule (100 mg total) by mouth daily as needed.  Dispense: 90 capsule; Refill: 1  2. Primary hypertension -  well-controlled with losartan  25 mg daily.  3. Mixed hyperlipidemia -  with elevated cholesterol and LDL. Triglycerides normal. On rosuvastatin  30 mg daily. - Continue rosuvastatin  30 mg daily.    General Health Maintenance Smoker, advised cessation. Takes vitamin E and B12. -  Advise smoking cessation.     Labs/tests ordered:   None   Return if symptoms worsen or fail to improve.  Keiron Iodice Medina-Vargas, NP

## 2024-05-30 ENCOUNTER — Other Ambulatory Visit

## 2024-05-30 DIAGNOSIS — I10 Essential (primary) hypertension: Secondary | ICD-10-CM | POA: Diagnosis not present

## 2024-05-30 DIAGNOSIS — E782 Mixed hyperlipidemia: Secondary | ICD-10-CM

## 2024-05-30 DIAGNOSIS — E1169 Type 2 diabetes mellitus with other specified complication: Secondary | ICD-10-CM | POA: Diagnosis not present

## 2024-05-30 DIAGNOSIS — E785 Hyperlipidemia, unspecified: Secondary | ICD-10-CM | POA: Diagnosis not present

## 2024-05-31 LAB — COMPLETE METABOLIC PANEL WITHOUT GFR
AG Ratio: 2 (calc) (ref 1.0–2.5)
ALT: 10 U/L (ref 6–29)
AST: 18 U/L (ref 10–35)
Albumin: 4.5 g/dL (ref 3.6–5.1)
Alkaline phosphatase (APISO): 56 U/L (ref 37–153)
BUN: 13 mg/dL (ref 7–25)
CO2: 27 mmol/L (ref 20–32)
Calcium: 9.9 mg/dL (ref 8.6–10.4)
Chloride: 102 mmol/L (ref 98–110)
Creat: 0.81 mg/dL (ref 0.60–1.00)
Globulin: 2.3 g/dL (ref 1.9–3.7)
Glucose, Bld: 102 mg/dL — ABNORMAL HIGH (ref 65–99)
Potassium: 4.3 mmol/L (ref 3.5–5.3)
Sodium: 138 mmol/L (ref 135–146)
Total Bilirubin: 0.4 mg/dL (ref 0.2–1.2)
Total Protein: 6.8 g/dL (ref 6.1–8.1)

## 2024-05-31 LAB — HEMOGLOBIN A1C
Hgb A1c MFr Bld: 7.1 % — ABNORMAL HIGH (ref <5.7)
Mean Plasma Glucose: 157 mg/dL
eAG (mmol/L): 8.7 mmol/L

## 2024-05-31 LAB — CBC WITH DIFFERENTIAL/PLATELET
Absolute Lymphocytes: 2721 {cells}/uL (ref 850–3900)
Absolute Monocytes: 531 {cells}/uL (ref 200–950)
Basophils Absolute: 43 {cells}/uL (ref 0–200)
Basophils Relative: 0.7 %
Eosinophils Absolute: 110 {cells}/uL (ref 15–500)
Eosinophils Relative: 1.8 %
HCT: 39.8 % (ref 35.0–45.0)
Hemoglobin: 12 g/dL (ref 11.7–15.5)
MCH: 26.4 pg — ABNORMAL LOW (ref 27.0–33.0)
MCHC: 30.2 g/dL — ABNORMAL LOW (ref 32.0–36.0)
MCV: 87.5 fL (ref 80.0–100.0)
MPV: 11.8 fL (ref 7.5–12.5)
Monocytes Relative: 8.7 %
Neutro Abs: 2696 {cells}/uL (ref 1500–7800)
Neutrophils Relative %: 44.2 %
Platelets: 243 Thousand/uL (ref 140–400)
RBC: 4.55 Million/uL (ref 3.80–5.10)
RDW: 14.2 % (ref 11.0–15.0)
Total Lymphocyte: 44.6 %
WBC: 6.1 Thousand/uL (ref 3.8–10.8)

## 2024-05-31 LAB — LIPID PANEL
Cholesterol: 214 mg/dL — ABNORMAL HIGH (ref <200)
HDL: 62 mg/dL (ref >=50)
LDL Cholesterol (Calc): 128 mg/dL — ABNORMAL HIGH
Non-HDL Cholesterol (Calc): 152 mg/dL — ABNORMAL HIGH (ref <130)
Total CHOL/HDL Ratio: 3.5 (calc) (ref <5.0)
Triglycerides: 129 mg/dL (ref <150)

## 2024-06-02 ENCOUNTER — Ambulatory Visit (INDEPENDENT_AMBULATORY_CARE_PROVIDER_SITE_OTHER): Admitting: Nurse Practitioner

## 2024-06-02 ENCOUNTER — Encounter: Payer: Self-pay | Admitting: Nurse Practitioner

## 2024-06-02 VITALS — BP 130/78 | HR 89 | Temp 97.8°F | Ht 66.0 in | Wt 125.8 lb

## 2024-06-02 DIAGNOSIS — E782 Mixed hyperlipidemia: Secondary | ICD-10-CM

## 2024-06-02 DIAGNOSIS — E1169 Type 2 diabetes mellitus with other specified complication: Secondary | ICD-10-CM | POA: Diagnosis not present

## 2024-06-02 DIAGNOSIS — G8929 Other chronic pain: Secondary | ICD-10-CM | POA: Insufficient documentation

## 2024-06-02 DIAGNOSIS — F17200 Nicotine dependence, unspecified, uncomplicated: Secondary | ICD-10-CM | POA: Diagnosis not present

## 2024-06-02 DIAGNOSIS — I10 Essential (primary) hypertension: Secondary | ICD-10-CM | POA: Insufficient documentation

## 2024-06-02 DIAGNOSIS — M8580 Other specified disorders of bone density and structure, unspecified site: Secondary | ICD-10-CM

## 2024-06-02 DIAGNOSIS — M5442 Lumbago with sciatica, left side: Secondary | ICD-10-CM

## 2024-06-02 DIAGNOSIS — E785 Hyperlipidemia, unspecified: Secondary | ICD-10-CM | POA: Diagnosis not present

## 2024-06-02 MED ORDER — ASPIRIN 81 MG PO TBEC: 81.0000 mg | DELAYED_RELEASE_TABLET | Freq: Every day | ORAL | Status: DC

## 2024-06-02 MED ORDER — GABAPENTIN 100 MG PO CAPS: 100.0000 mg | ORAL_CAPSULE | Freq: Two times a day (BID) | ORAL | 1 refills | Status: DC | PRN

## 2024-06-02 MED ORDER — ROSUVASTATIN CALCIUM 40 MG PO TABS: 40.0000 mg | ORAL_TABLET | Freq: Every day | ORAL | 1 refills | Status: AC

## 2024-06-02 NOTE — Assessment & Plan Note (Signed)
 Blood pressure well controlled, goal bp <140/90 Continue current medications and dietary modifications follow metabolic panel

## 2024-06-02 NOTE — Assessment & Plan Note (Signed)
 Worsening sciatica, using gabapentin  with some releif, will increase at this time and get xray of lumbar spine.

## 2024-06-02 NOTE — Progress Notes (Signed)
 Careteam: Patient Care Team: Caro Harlene POUR, NP as PCP - General (Geriatric Medicine) Joshua Rush, OD (Optometry)  PLACE OF SERVICE:  [facility]  Advanced Directive information    No Known Allergies  Chief Complaint  Patient presents with   Medical Management of Chronic Issues    Ongoing tingling from left hip all the way to her foot. Was seen last month for this but it's still happening.     HPI:  Discussed the use of AI scribe software for clinical note transcription with the patient, who gave verbal consent to proceed.  History of Present Illness Emily Hayes is a 73 year old female with hyperlipidemia and type 2 diabetes who presents for a four-month follow-up visit.  At her last visit, her Crestor  (rosuvastatin ) dosage was increased to one and a half tablets daily due to her cholesterol not being at goal. Despite this adjustment, her cholesterol levels have increased. She smokes. She is currently taking Crestor  every night.  Her hemoglobin A1c has increased slightly to 7.1%, which is above the target of less than 7%. She has made significant dietary changes, including cutting out smoothies, macaroni and cheese, and baked goods, and primarily consuming vegetables, meat, and whole wheat products. Despite these efforts, her A1c has not decreased. She has lost weight, now weighing between 125 and 129 pounds, and is concerned about further weight loss. Does not want medication.   She has a history of sciatic pain, present for about two weeks. The pain is described as a burning sensation on the left side, worsening when lying on her right side, sometimes radiating down her leg, and exacerbated by sneezing or coughing. She was previously prescribed gabapentin , which provides inconsistent relief. She is currently taking one gabapentin  daily but is considering increasing the dosage to one twice a day.  Her family history includes diabetes and stroke, with her mother having  had a stroke and her brother having diabetes. She smokes and occasionally drinks beer with pizza. She reports low back pain only when sneezing or coughing.   Review of Systems:  Review of Systems  Constitutional:  Negative for chills, fever and weight loss.  HENT:  Negative for tinnitus.   Respiratory:  Negative for cough, sputum production and shortness of breath.   Cardiovascular:  Negative for chest pain, palpitations and leg swelling.  Gastrointestinal:  Negative for abdominal pain, constipation, diarrhea and heartburn.  Genitourinary:  Negative for dysuria, frequency and urgency.  Musculoskeletal:  Positive for back pain. Negative for falls, joint pain and myalgias.  Skin: Negative.   Neurological:  Positive for tingling and sensory change. Negative for dizziness and headaches.  Psychiatric/Behavioral:  Negative for depression and memory loss. The patient does not have insomnia.     Past Medical History:  Diagnosis Date   Cervical radiculopathy    Fatigue    Generalized osteoarthrosis    HTN (hypertension)    Hyperlipidemia    Lateral epicondylitis of left elbow 06/28/2017   Left knee pain    Menopause    Neck pain    Pneumonia    Pre-diabetes    Sciatica    Swelling of left knee joint    Uterine cancer [facility])    Past Surgical History:  Procedure Laterality Date   ABCESS DRAINAGE Left 09/15/2017   behind tonsil   APPENDECTOMY     EXCISION METACARPAL MASS Right 12/29/2023   Procedure: EXCISION METACARPAL MASS;  Surgeon: Jerri Kay HERO, MD;  Location: [facility];  Service: Orthopedics;  Laterality: Right;  right thumb and long finger cyst removal   VAGINAL HYSTERECTOMY  1983   Social History:   reports that she has been smoking cigarettes. She has a 25 pack-year smoking history. She has never used smokeless tobacco. She reports that she does not drink alcohol and does not use drugs.  Family History  Problem Relation Age of Onset   Stroke Mother     Diabetes Mother    Dementia Mother    Alzheimer's disease Mother    Colon polyps Brother    Hypertension Brother    Hyperlipidemia Brother    Cancer Sister        unknown   Cancer Sister        unknown   Kidney failure Brother    Cancer Sister        unknown   Lung cancer Brother    Cancer Sister        unknown   Colon cancer Neg Hx    Esophageal cancer Neg Hx    Rectal cancer Neg Hx    Stomach cancer Neg Hx     Medications: Patient's Medications  New Prescriptions   No medications on file  Previous Medications   ACETAMINOPHEN  (TYLENOL ) 500 MG TABLET    Take 500 mg by mouth daily as needed.   CALCIUM  CITRATE-VITAMIN D (CALCIUM  CITRATE + D PO)    Take 1 tablet by mouth 2 (two) times daily.   CYCLOBENZAPRINE  (FLEXERIL ) 10 MG TABLET    Take 1 tablet (10 mg total) by mouth 2 (two) times daily as needed for muscle spasms.   DIPHENHYDRAMINE (BENADRYL) 25 MG TABLET    Take 25 mg by mouth as needed.    GABAPENTIN  (NEURONTIN ) 100 MG CAPSULE    Take 1 capsule (100 mg total) by mouth daily as needed.   LOSARTAN  (COZAAR ) 25 MG TABLET    Take 1 tablet (25 mg total) by mouth daily.   MAGNESIUM CARBONATE, ANTACID, (MAGNESIUM CARBONATE PO)    Take 240 mg by mouth 2 (two) times daily.   MISC NATURAL PRODUCTS (COLON CLEANSE) CAPS    Take by mouth as needed. As needed   ROSUVASTATIN  (CRESTOR ) 20 MG TABLET    Take 1.5 tablets (30 mg total) by mouth daily.   SIMETHICONE (GAS RELIEF EXTRA STRENGTH PO)    Take 1 capsule by mouth as needed.   VITAMIN E 400 UNIT CAPSULE    Take 1,200 Units by mouth daily.   Modified Medications   No medications on file  Discontinued Medications   No medications on file    Physical Exam:  Vitals:   06/02/24 1128  BP: 130/78  Pulse: 89  Temp: 97.8 F (36.6 C)  SpO2: 98%  Weight: 125 lb 12.8 oz (57.1 kg)  Height: 5' 6 (1.676 m)   Body mass index is 20.3 kg/m. Wt Readings from Last 3 Encounters:  06/02/24 125 lb 12.8 oz (57.1 kg)  05/05/24 127 lb 9.6  oz (57.9 kg)  01/31/24 129 lb 6.4 oz (58.7 kg)    Physical Exam Constitutional:      General: She is not in acute distress.    Appearance: She is well-developed. She is not diaphoretic.  HENT:     Head: Normocephalic and atraumatic.     Mouth/Throat:     Pharynx: No oropharyngeal exudate.  Eyes:     Conjunctiva/sclera: Conjunctivae normal.     Pupils: Pupils are equal, round, and reactive  to light.  Cardiovascular:     Rate and Rhythm: Normal rate and regular rhythm.     Heart sounds: Normal heart sounds.  Pulmonary:     Effort: Pulmonary effort is normal.     Breath sounds: Normal breath sounds.  Abdominal:     General: Bowel sounds are normal.     Palpations: Abdomen is soft.  Musculoskeletal:     Cervical back: Normal range of motion and neck supple.     Right lower leg: No edema.     Left lower leg: No edema.  Skin:    General: Skin is warm and dry.  Neurological:     Mental Status: She is alert.  Psychiatric:        Mood and Affect: Mood normal.     Labs reviewed: Basic Metabolic Panel: Recent Labs    09/27/23 0815 01/26/24 0820 05/30/24 0840  NA 140 139 138  K 4.3 4.3 4.3  CL 102 103 102  CO2 30 29 27   GLUCOSE 97 100* 102*  BUN 15 15 13   CREATININE 0.76 0.70 0.81  CALCIUM  9.9 9.4 9.9   Liver Function Tests: Recent Labs    09/27/23 0815 01/26/24 0820 05/30/24 0840  AST 16 16 18   ALT 11 9 10   BILITOT 0.4 0.4 0.4  PROT 7.0 6.9 6.8   No results for input(s): LIPASE, AMYLASE in the last 8760 hours. No results for input(s): AMMONIA in the last 8760 hours. CBC: Recent Labs    06/28/23 0940 01/26/24 0820 05/30/24 0840  WBC 5.5 6.0 6.1  NEUTROABS 2,459 2,712 2,696  HGB 12.5 12.3 12.0  HCT 39.6 39.3 39.8  MCV 84.1 86.0 87.5  PLT 267 277 243   Lipid Panel: Recent Labs    09/27/23 0815 01/26/24 0820 05/30/24 0840  CHOL 208* 207* 214*  HDL 64 65 62  LDLCALC 121* 118* 128*  TRIG 122 126 129  CHOLHDL 3.3 3.2 3.5   TSH: No  results for input(s): TSH in the last 8760 hours. A1C: Lab Results  Component Value Date   HGBA1C 7.1 (H) 05/30/2024     Assessment/Plan  Primary hypertension Assessment & Plan: Blood pressure well controlled, goal bp <140/90 Continue current medications and dietary modifications follow metabolic panel  Orders: -     Aspirin ; Take 1 tablet (81 mg total) by mouth daily. Swallow whole.  Mixed hyperlipidemia Assessment & Plan: LDL increased to 128 on crestor , she reports dietary modifications.  Will increase crestor  to 40 mg daily  Orders: -     Rosuvastatin  Calcium ; Take 1 tablet (40 mg total) by mouth daily.  Dispense: 90 tablet; Refill: 1 -     Aspirin ; Take 1 tablet (81 mg total) by mouth daily. Swallow whole.  Smoker Assessment & Plan: Does not wish to quit smoking, declines screening CT  Orders: -     Aspirin ; Take 1 tablet (81 mg total) by mouth daily. Swallow whole.  Type 2 diabetes mellitus with hyperlipidemia (HCC) Assessment & Plan: Not currently on medication, wants to manage with diet A1c 7.1 Encouraged dietary compliance, routine foot care/monitoring and to keep up with diabetic eye exams through ophthalmology  Will continue to follow  Orders: -     Aspirin ; Take 1 tablet (81 mg total) by mouth daily. Swallow whole.  Chronic left-sided low back pain with left-sided sciatica Assessment & Plan: Worsening sciatica, using gabapentin  with some releif, will increase at this time and get xray of lumbar spine.   Orders: -  DG Lumbar Spine Complete; Future -     Gabapentin ; Take 1-2 capsules (100-200 mg total) by mouth 2 (two) times daily as needed.  Dispense: 120 capsule; Refill: 1    Return in about 4 months (around 10/02/2024) for routine follow up, labs prior to visit.  Trenton Verne K. Caro BODILY [facility] [phone]

## 2024-06-02 NOTE — Assessment & Plan Note (Signed)
 LDL increased to 128 on crestor , she reports dietary modifications.  Will increase crestor  to 40 mg daily

## 2024-06-02 NOTE — Assessment & Plan Note (Signed)
 Does not wish to quit smoking, declines screening CT

## 2024-06-02 NOTE — Patient Instructions (Signed)
 To go to [facility] for xray

## 2024-06-02 NOTE — Assessment & Plan Note (Signed)
 Not currently on medication, wants to manage with diet A1c 7.1 Encouraged dietary compliance, routine foot care/monitoring and to keep up with diabetic eye exams through ophthalmology  Will continue to follow

## 2024-06-02 NOTE — Assessment & Plan Note (Signed)
 Recommended to take calcium  600 mg twice daily with Vitamin D 2000 units daily and weight bearing activity 30 mins/5 days a week She has bone density scheduled.

## 2024-06-05 ENCOUNTER — Ambulatory Visit
Admission: RE | Admit: 2024-06-05 | Discharge: 2024-06-05 | Disposition: A | Discharge status: Home / Self Care | Source: Ambulatory Visit | Attending: Nurse Practitioner | Admitting: Nurse Practitioner

## 2024-06-05 DIAGNOSIS — G8929 Other chronic pain: Secondary | ICD-10-CM

## 2024-06-05 DIAGNOSIS — M4726 Other spondylosis with radiculopathy, lumbar region: Secondary | ICD-10-CM | POA: Diagnosis not present

## 2024-06-16 ENCOUNTER — Ambulatory Visit: Payer: Self-pay | Admitting: Nurse Practitioner

## 2024-07-03 ENCOUNTER — Ambulatory Visit (INDEPENDENT_AMBULATORY_CARE_PROVIDER_SITE_OTHER): Payer: Medicare HMO | Admitting: Nurse Practitioner

## 2024-07-03 ENCOUNTER — Encounter: Payer: Self-pay | Admitting: Nurse Practitioner

## 2024-07-03 VITALS — BP 126/82 | HR 96 | Temp 97.9°F | Ht 66.0 in | Wt 125.6 lb

## 2024-07-03 DIAGNOSIS — Z0000 Encounter for general adult medical examination without abnormal findings: Secondary | ICD-10-CM | POA: Diagnosis not present

## 2024-07-03 DIAGNOSIS — Z66 Do not resuscitate: Secondary | ICD-10-CM | POA: Diagnosis not present

## 2024-07-03 DIAGNOSIS — E785 Hyperlipidemia, unspecified: Secondary | ICD-10-CM | POA: Diagnosis not present

## 2024-07-03 DIAGNOSIS — E1169 Type 2 diabetes mellitus with other specified complication: Secondary | ICD-10-CM

## 2024-07-03 MED ORDER — COVID-19 MRNA VACCINE (PFIZER) 30 MCG/0.3ML IM SUSP: 0.3000 mL | Freq: Once | INTRAMUSCULAR | 0 refills | Status: AC

## 2024-07-03 NOTE — Patient Instructions (Signed)
  Emily Hayes , Thank you for taking time to come for your Medicare Wellness Visit. I appreciate your ongoing commitment to your health goals. Please review the following plan we discussed and let me know if I can assist you in the future.       Will send COVID booster to pharmacy   This is a list of the screening recommended for you and due dates:  Health Maintenance  Topic Date Due   Eye exam for diabetics  11/11/2023   DEXA scan (bone density measurement)  11/27/2023   COVID-19 Vaccine (8 - 2024-25 season) 06/19/2024   Yearly kidney health urinalysis for diabetes  06/27/2024   Pneumococcal Vaccine for age over 24 (1 of 2 - PCV) 10/19/2048*   Hemoglobin A1C  11/30/2024   Complete foot exam   01/30/2025   Yearly kidney function blood test for diabetes  05/30/2025   Medicare Annual Wellness Visit  07/03/2025   Breast Cancer Screening  07/05/2025   Colon Cancer Screening  01/02/2031   DTaP/Tdap/Td vaccine (3 - Td or Tdap) 09/18/2033   Hepatitis C Screening  Completed   Zoster (Shingles) Vaccine  Completed   HPV Vaccine  Aged Out   Meningitis B Vaccine  Aged Out   Screening for Lung Cancer  Discontinued   Flu Shot  Discontinued   Cologuard (Stool DNA test)  Discontinued  *Topic was postponed. The date shown is not the original due date.

## 2024-07-03 NOTE — Progress Notes (Signed)
 Subjective:   Emily Hayes is a 73 y.o. female who presents for Medicare Annual (Subsequent) preventive examination.  Visit Complete: In person at [facility]  Cardiac Risk Factors include: advanced age (>66men, >36 women);diabetes mellitus;hypertension;dyslipidemia;smoking/ tobacco exposure     Objective:    Today's Vitals   07/03/24 0816  BP: 126/82  Pulse: 96  Temp: 97.9 F (36.6 C)  SpO2: 99%  Weight: 125 lb 9.6 oz (57 kg)  Height: 5' 6 (1.676 m)   Body mass index is 20.27 kg/m. Wt Readings from Last 3 Encounters:  07/03/24 125 lb 9.6 oz (57 kg)  06/02/24 125 lb 12.8 oz (57.1 kg)  05/05/24 127 lb 9.6 oz (57.9 kg)        07/03/2024    8:19 AM 12/24/2023   11:33 AM 12/13/2023    1:43 PM 06/30/2023    9:52 AM 06/28/2023    8:57 AM 12/28/2022    8:45 AM 06/29/2022    8:42 AM  Advanced Directives  Does Patient Have a Medical Advance Directive? Yes Yes Yes Yes Yes Yes Yes  Type of Advance Directive Out of facility DNR (pink MOST or yellow form) Healthcare Power of [place];Living will Out of facility DNR (pink MOST or yellow form) Out of facility DNR (pink MOST or yellow form) Out of facility DNR (pink MOST or yellow form) Out of facility DNR (pink MOST or yellow form) Healthcare Power of [place];Living will  Does patient want to make changes to medical advance directive? No - Patient declined  No - Patient declined No - Patient declined No - Patient declined No - Patient declined   Copy of Healthcare Power of Attorney in Chart?       No - copy requested  Pre-existing out of facility DNR order (yellow form or pink MOST form)      Yellow form placed in chart (order not valid for inpatient use)     Current Medications (verified) Outpatient Encounter Medications as of 07/03/2024  Medication Sig   acetaminophen  (TYLENOL ) 500 MG tablet Take 500 mg by mouth daily as needed.   aspirin  EC 81 MG tablet Take 1 tablet (81 mg total) by mouth daily. Swallow whole.   B Complex-Folic Acid  (B COMPLEX-VITAMIN B12 PO) Take 1 tablet by mouth daily.   Calcium  Citrate-Vitamin D (CALCIUM  CITRATE + D PO) Take 1 tablet by mouth 2 (two) times daily. (Patient taking differently: Take 1 tablet by mouth daily at 6 (six) AM.)   cyclobenzaprine  (FLEXERIL ) 10 MG tablet Take 1 tablet (10 mg total) by mouth 2 (two) times daily as needed for muscle spasms.   diphenhydrAMINE (BENADRYL) 25 MG tablet Take 25 mg by mouth as needed.    gabapentin  (NEURONTIN ) 100 MG capsule Take 1-2 capsules (100-200 mg total) by mouth 2 (two) times daily as needed.   losartan  (COZAAR ) 25 MG tablet Take 1 tablet (25 mg total) by mouth daily.   Magnesium Carbonate, Antacid, (MAGNESIUM CARBONATE PO) Take 240 mg by mouth 2 (two) times daily.   Misc Natural Products (COLON CLEANSE) CAPS Take by mouth as needed. As needed   rosuvastatin  (CRESTOR ) 40 MG tablet Take 1 tablet (40 mg total) by mouth daily.   Simethicone (GAS RELIEF EXTRA STRENGTH PO) Take 1 capsule by mouth as needed.   vitamin E 400 UNIT capsule Take 1,200 Units by mouth daily.    [DISCONTINUED] COVID-19 mRNA vaccine, Pfizer, 30 MCG/0.3ML injection Inject 0.3 mLs into the muscle once.   COVID-19 mRNA vaccine,  Pfizer, 30 MCG/0.3ML injection Inject 0.3 mLs into the muscle once for 1 dose.   No facility-administered encounter medications on file as of 07/03/2024.    Allergies (verified) Patient has no known allergies.   History: Past Medical History:  Diagnosis Date   Cervical radiculopathy    Fatigue    Generalized osteoarthrosis    HTN (hypertension)    Hyperlipidemia    Lateral epicondylitis of left elbow 06/28/2017   Left knee pain    Menopause    Neck pain    Pneumonia    Pre-diabetes    Sciatica    Swelling of left knee joint    Uterine cancer [facility])    Past Surgical History:  Procedure Laterality Date   ABCESS DRAINAGE Left 09/15/2017   behind tonsil   APPENDECTOMY     EXCISION METACARPAL MASS Right 12/29/2023   Procedure: EXCISION  METACARPAL MASS;  Surgeon: Jerri Kay HERO, MD;  Location: [facility];  Service: Orthopedics;  Laterality: Right;  right thumb and long finger cyst removal   VAGINAL HYSTERECTOMY  1983   Family History  Problem Relation Age of Onset   Stroke Mother    Diabetes Mother    Dementia Mother    Alzheimer's disease Mother    Colon polyps Brother    Hypertension Brother    Hyperlipidemia Brother    Cancer Sister        unknown   Cancer Sister        unknown   Kidney failure Brother    Cancer Sister        unknown   Lung cancer Brother    Cancer Sister        unknown   Colon cancer Neg Hx    Esophageal cancer Neg Hx    Rectal cancer Neg Hx    Stomach cancer Neg Hx    Social History   Socioeconomic History   Marital status: Divorced    Spouse name: Not on file   Number of children: 2   Years of education: Not on file   Highest education level: Associate degree: academic program  Occupational History   Occupation: retired  Tobacco Use   Smoking status: Every Day    Current packs/day: 0.50    Average packs/day: 0.5 packs/day for 50.0 years (25.0 ttl pk-yrs)    Types: Cigarettes   Smokeless tobacco: Never  Vaping Use   Vaping status: Never Used  Substance and Sexual Activity   Alcohol use: No   Drug use: No   Sexual activity: Not Currently    Birth control/protection: Surgical    Comment: Hyst  Other Topics Concern   Not on file  Social History Narrative   Social History      Diet? none      Do you drink/eat things with caffeine? Chocolate- occasionally      Marital status?        divorced                            What year were you married? 1982      Do you live in a house, apartment, assisted living, condo, trailer, etc.? apartment      Is it one or more stories? 1      How many persons live in your home? 1       Do you have any pets in your home? (please list) yes, dog  Highest level of education completed? Associate degree- college       Current or past profession: retired      Do you exercise?           yes                           Type & how often? Walk- daily      Advanced Directives      Do you have a living will? yes      Do you have a DNR form?     yes                             If not, do you want to discuss one? yes      Do you have signed POA/HPOA for forms? no      Functional Status      Do you have difficulty bathing or dressing yourself?      Do you have difficulty preparing food or eating?       Do you have difficulty managing your medications?      Do you have difficulty managing your finances?      Do you have difficulty affording your medications?   Social Drivers of Corporate investment banker Strain: Low Risk  (05/29/2024)   Overall Financial Resource Strain (CARDIA)    Difficulty of Paying Living Expenses: Not hard at all  Food Insecurity: No Food Insecurity (05/29/2024)   Hunger Vital Sign    Worried About Running Out of Food in the Last Year: Never true    Ran Out of Food in the Last Year: Never true  Transportation Needs: No Transportation Needs (05/29/2024)   PRAPARE - Administrator, Civil Service (Medical): No    Lack of Transportation (Non-Medical): No  Physical Activity: Sufficiently Active (10/01/2023)   Exercise Vital Sign    Days of Exercise per Week: 7 days    Minutes of Exercise per Session: 40 min  Stress: No Stress Concern Present (10/01/2023)   [employer] of Occupational Health - Occupational Stress Questionnaire    Feeling of Stress : Not at all  Social Connections: Socially Isolated (05/29/2024)   Social Connection and Isolation Panel    Frequency of Communication with Friends and Family: Three times a week    Frequency of Social Gatherings with Friends and Family: Twice a week    Attends Religious Services: Patient declined    Database administrator or Organizations: No    Attends Engineer, structural: Not on file    Marital Status:  Divorced    Tobacco Counseling Ready to quit: Not Answered Counseling given: Not Answered   Clinical Intake:  Pre-visit preparation completed: Yes  Pain : No/denies pain     BMI - recorded: 20 Nutritional Status: BMI of 19-24  Normal Nutritional Risks: Unintentional weight loss Diabetes: Yes  How often do you need to have someone help you when you read instructions, pamphlets, or other written materials from your doctor or pharmacy?: 1 - Never         Activities of Daily Living    07/03/2024    8:35 AM 12/29/2023   12:09 PM  In your present state of health, do you have any difficulty performing the following activities:  Hearing? 0 0  Vision? 0 0  Difficulty concentrating or making decisions? 0 0  Walking or climbing stairs? 0   Dressing or bathing? 0   Doing errands, shopping? 0   Preparing Food and eating ? N   Using the Toilet? N   In the past six months, have you accidently leaked urine? N   Do you have problems with loss of bowel control? N   Managing your Medications? N   Managing your Finances? N   Housekeeping or managing your Housekeeping? N     Patient Care Team: Caro Harlene POUR, NP as PCP - General (Geriatric Medicine) Joshua Rush, OD (Optometry)  Indicate any recent Medical Services you may have received from other than [facility] providers in the past year (date may be approximate).     Assessment:   This is a routine wellness examination for [place].  Hearing/Vision screen Vision Screening - Comments:: [facility] Last Exam-2025 Request sent   Goals Addressed   None    Depression Screen    07/03/2024    8:20 AM 12/13/2023    1:42 PM 10/01/2023   10:20 AM 06/30/2023    9:55 AM 06/28/2023    8:56 AM 12/28/2022    8:40 AM 06/29/2022    8:40 AM  PHQ 2/9 Scores  PHQ - 2 Score 0 0 0 0 0 0 0    Fall Risk    07/03/2024    8:20 AM 12/13/2023    1:42 PM 10/01/2023   10:20 AM 08/27/2023    8:36 AM 06/30/2023    9:54 AM  Fall Risk    Falls in the past year? 0 0 0 0 0  Number falls in past yr: 0 0 0 0 0  Injury with Fall? 0 0 0 0 0  Risk for fall due to : No Fall Risks  No Fall Risks No Fall Risks   Follow up Falls evaluation completed  Falls evaluation completed Falls evaluation completed     MEDICARE RISK AT HOME: Medicare Risk at Home Any stairs in or around the home?: Yes If so, are there any without handrails?: No Home free of loose throw rugs in walkways, pet beds, electrical cords, etc?: Yes Adequate lighting in your home to reduce risk of falls?: Yes Life alert?: No Use of a cane, walker or w/c?: No Grab bars in the bathroom?: No Shower chair or bench in shower?: No Elevated toilet seat or a handicapped toilet?: No  TIMED UP AND GO:  Was the test performed?  No    Cognitive Function:    06/28/2023    9:09 AM 01/05/2018    8:45 AM  MMSE - Mini Mental State Exam  Orientation to time 4 5  Orientation to Place 5 5  Registration 3 3  Attention/ Calculation 5 5  Recall 1 3  Language- name 2 objects 2 2  Language- repeat 1 1  Language- follow 3 step command 3 3  Language- read & follow direction 1 1  Write a sentence 1 1  Copy design 1 1  Total score 27 30        07/03/2024    8:22 AM 06/16/2022   10:10 AM 06/12/2021    1:52 PM 02/13/2020    2:02 PM 02/10/2019    9:49 AM  6CIT Screen  What Year? 0 points 0 points 0 points 0 points 0 points  What month? 0 points 0 points 0 points 0 points 0 points  What time? 0 points 0 points 0 points 0 points 0 points  Count back  from 20 0 points 0 points 0 points 0 points 0 points  Months in reverse 0 points 0 points 0 points 0 points 0 points  Repeat phrase 0 points 0 points 0 points 0 points 2 points  Total Score 0 points 0 points 0 points 0 points 2 points    Immunizations Immunization History  Administered Date(s) Administered   Moderna Covid-19 Fall Seasonal Vaccine 48yrs & older 09/27/2022   Moderna Covid-19 Vaccine Bivalent Booster 56yrs & up  06/25/2021   Moderna SARS-COV2 Booster Vaccination 01/28/2021   Moderna Sars-Covid-2 Vaccination 12/01/2019, 12/29/2019, 08/17/2020   RSV,unspecified 09/19/2023   Tdap 02/05/2011, 09/19/2023   Unspecified SARS-COV-2 Vaccination 09/19/2023   Zoster Recombinant(Shingrix) 03/19/2017, 07/19/2017    TDAP status: Up to date  Flu Vaccine status: Up to date  Pneumococcal vaccine status: Up to date  Covid-19 vaccine status: Information provided on how to obtain vaccines.   Qualifies for Shingles Vaccine? Yes   Zostavax completed No   Shingrix Completed?: Yes  Screening Tests Health Maintenance  Topic Date Due   OPHTHALMOLOGY EXAM  11/11/2023   DEXA SCAN  11/27/2023   COVID-19 Vaccine (8 - 2024-25 season) 06/19/2024   Diabetic kidney evaluation - Urine ACR  06/27/2024   Pneumococcal Vaccine: 50+ Years (1 of 2 - PCV) 10/19/2048 (Originally 08/11/1970)   HEMOGLOBIN A1C  11/30/2024   FOOT EXAM  01/30/2025   Diabetic kidney evaluation - eGFR measurement  05/30/2025   Medicare Annual Wellness (AWV)  07/03/2025   Mammogram  07/05/2025   Colonoscopy  01/02/2031   DTaP/Tdap/Td (3 - Td or Tdap) 09/18/2033   Hepatitis C Screening  Completed   Zoster Vaccines- Shingrix  Completed   HPV VACCINES  Aged Out   Meningococcal B Vaccine  Aged Out   Lung Cancer Screening  Discontinued   Influenza Vaccine  Discontinued   Fecal DNA (Cologuard)  Discontinued    Health Maintenance  Health Maintenance Due  Topic Date Due   OPHTHALMOLOGY EXAM  11/11/2023   DEXA SCAN  11/27/2023   COVID-19 Vaccine (8 - 2024-25 season) 06/19/2024   Diabetic kidney evaluation - Urine ACR  06/27/2024    Colorectal cancer screening: Type of screening: Colonoscopy. Completed 2022. Repeat every 10 years but complete now   Mammogram status: Completed 2024. Repeat every year  Bone Density status: Ordered and scheduled. Pt provided with contact info and advised to call to schedule appt.  Lung Cancer Screening: (Low  Dose CT Chest recommended if Age 69-80 years, 20 pack-year currently smoking OR have quit w/in 15years.) does qualify.   Lung Cancer Screening Referral: decline referral and screening  Additional Screening:  Hepatitis C Screening: does qualify; Completed   Vision Screening: Recommended annual ophthalmology exams for early detection of glaucoma and other disorders of the eye. Is the patient up to date with their annual eye exam?  Yes  Who is the provider or what is the name of the office in which the patient attends annual eye exams? [facility] If pt is not established with a provider, would they like to be referred to a provider to establish care? No .   Dental Screening: Recommended annual dental exams for proper oral hygiene  Diabetic Foot Exam: Diabetic Foot Exam: Completed 01/31/2024  Community Resource Referral / Chronic Care Management: CRR required this visit?  No   CCM required this visit?  No     Plan:     I have personally reviewed and noted the following in the patient's chart:  Medical and social history Use of alcohol, tobacco or illicit drugs  Current medications and supplements including opioid prescriptions. Patient is not currently taking opioid prescriptions. Functional ability and status Nutritional status Physical activity Advanced directives List of other physicians Hospitalizations, surgeries, and ER visits in previous 12 months Vitals Screenings to include cognitive, depression, and falls Referrals and appointments  In addition, I have reviewed and discussed with patient certain preventive protocols, quality metrics, and best practice recommendations. A written personalized care plan for preventive services as well as general preventive health recommendations were provided to patient.     Harlene MARLA An, NP   07/03/2024

## 2024-07-04 ENCOUNTER — Ambulatory Visit: Payer: Self-pay | Admitting: Nurse Practitioner

## 2024-07-04 LAB — MICROALBUMIN / CREATININE URINE RATIO
Creatinine, Urine: 28 mg/dL (ref 20–275)
Microalb Creat Ratio: 25 mg/g{creat} (ref <30)
Microalb, Ur: 0.7 mg/dL

## 2024-07-06 ENCOUNTER — Telehealth: Payer: Self-pay

## 2024-07-06 ENCOUNTER — Other Ambulatory Visit: Payer: Self-pay | Admitting: Nurse Practitioner

## 2024-07-06 DIAGNOSIS — Z1231 Encounter for screening mammogram for malignant neoplasm of breast: Secondary | ICD-10-CM

## 2024-07-06 NOTE — Telephone Encounter (Signed)
 Copied from CRM [id number]. Topic: General - Other >> Jul 06, 2024 10:08 AM Farrel B wrote: Reason for CRM: Patient called to inform her PCP that the eye exam and that [facility] needed to request the results personally from his office.  With dr. Joshua at [facility]  Sent over records request to office.

## 2024-07-07 ENCOUNTER — Ambulatory Visit
Admission: RE | Admit: 2024-07-07 | Discharge: 2024-07-07 | Disposition: A | Discharge status: Home / Self Care | Source: Ambulatory Visit | Attending: Nurse Practitioner | Admitting: Nurse Practitioner

## 2024-07-07 DIAGNOSIS — Z1231 Encounter for screening mammogram for malignant neoplasm of breast: Secondary | ICD-10-CM | POA: Diagnosis not present

## 2024-07-10 ENCOUNTER — Other Ambulatory Visit: Payer: Self-pay

## 2024-07-10 DIAGNOSIS — G8929 Other chronic pain: Secondary | ICD-10-CM

## 2024-07-10 MED ORDER — GABAPENTIN 100 MG PO CAPS: 100.0000 mg | ORAL_CAPSULE | Freq: Two times a day (BID) | ORAL | 1 refills | Status: DC | PRN

## 2024-07-10 NOTE — Telephone Encounter (Signed)
 [facility] has faxed over request for additional refills on medication. Pend and sent to PCP Caro Harlene POUR, NP for approval.

## 2024-07-12 ENCOUNTER — Other Ambulatory Visit: Payer: Self-pay | Admitting: Nurse Practitioner

## 2024-07-12 DIAGNOSIS — R928 Other abnormal and inconclusive findings on diagnostic imaging of breast: Secondary | ICD-10-CM

## 2024-07-17 ENCOUNTER — Telehealth: Payer: Self-pay

## 2024-07-17 NOTE — Telephone Encounter (Signed)
 Copied from CRM [id number]. Topic: Appointments - Appointment Scheduling >> Jul 17, 2024  2:49 PM Diannia H wrote: Patient/patient representative is calling to schedule an appointment. I did make her the first available appointment for provider but the patient states she is still having some tingling in her left foot, that goes all the way up to her hip. She is wanting to be seen sooner than Nov. Could you assist? Patients callback number is [phone].

## 2024-07-18 MED ORDER — GABAPENTIN 300 MG PO CAPS: 300.0000 mg | ORAL_CAPSULE | Freq: Two times a day (BID) | ORAL | 3 refills | Status: DC

## 2024-07-18 NOTE — Progress Notes (Signed)
 Emily Hayes                                          MRN: [id number]   07/18/2024   The [facility]BCI Quality Team Specialist reviewed this patient medical record for the purposes of chart review for care gap closure. The following were reviewed: chart review for care gap closure-kidney health evaluation for diabetes:eGFR  and uACR.    VBCI Quality Team

## 2024-07-19 ENCOUNTER — Ambulatory Visit
Admission: RE | Admit: 2024-07-19 | Discharge: 2024-07-19 | Disposition: A | Discharge status: Home / Self Care | Source: Ambulatory Visit | Attending: Nurse Practitioner | Admitting: Nurse Practitioner

## 2024-07-19 ENCOUNTER — Ambulatory Visit
Admission: RE | Admit: 2024-07-19 | Discharge: 2024-07-19 | Disposition: A | Source: Ambulatory Visit | Attending: Nurse Practitioner | Admitting: Nurse Practitioner

## 2024-07-19 ENCOUNTER — Other Ambulatory Visit: Payer: Self-pay | Admitting: Nurse Practitioner

## 2024-07-19 DIAGNOSIS — R928 Other abnormal and inconclusive findings on diagnostic imaging of breast: Secondary | ICD-10-CM | POA: Diagnosis not present

## 2024-07-19 DIAGNOSIS — N6310 Unspecified lump in the right breast, unspecified quadrant: Secondary | ICD-10-CM | POA: Diagnosis not present

## 2024-07-19 DIAGNOSIS — N6311 Unspecified lump in the right breast, upper outer quadrant: Secondary | ICD-10-CM

## 2024-07-19 DIAGNOSIS — R921 Mammographic calcification found on diagnostic imaging of breast: Secondary | ICD-10-CM

## 2024-07-26 ENCOUNTER — Ambulatory Visit
Admission: RE | Admit: 2024-07-26 | Discharge: 2024-07-26 | Disposition: A | Discharge status: Home / Self Care | Source: Ambulatory Visit | Attending: Nurse Practitioner | Admitting: Nurse Practitioner

## 2024-07-26 ENCOUNTER — Ambulatory Visit: Admitting: Family Medicine

## 2024-07-26 DIAGNOSIS — N6311 Unspecified lump in the right breast, upper outer quadrant: Secondary | ICD-10-CM | POA: Diagnosis not present

## 2024-07-26 DIAGNOSIS — Z7689 Persons encountering health services in other specified circumstances: Secondary | ICD-10-CM | POA: Diagnosis not present

## 2024-07-26 DIAGNOSIS — N6310 Unspecified lump in the right breast, unspecified quadrant: Secondary | ICD-10-CM | POA: Diagnosis not present

## 2024-07-26 DIAGNOSIS — R921 Mammographic calcification found on diagnostic imaging of breast: Secondary | ICD-10-CM

## 2024-07-26 DIAGNOSIS — M79605 Pain in left leg: Secondary | ICD-10-CM | POA: Diagnosis not present

## 2024-07-26 HISTORY — PX: BREAST BIOPSY: SHX20

## 2024-07-26 NOTE — Telephone Encounter (Signed)
 Patient stated she will call back and make appointment upon how the medicine will work. The checkout note stated as follows:  Return in about 3 months (around 10/26/2024) for Recheck HTN.

## 2024-07-26 NOTE — Progress Notes (Signed)
 Subjective  Patient ID: Emily Hayes  is a 73 y.o. female here in clinic for:  Chief Complaint  Patient presents with  . New Patient     The following information was reviewed by members of the visit team:   Medical History[1]   Surgical History[2]   Family History[3]   Social History   Socioeconomic History  . Marital status: Divorced    Spouse name: Not on file  . Number of children: Not on file  . Years of education: Not on file  . Highest education level: Not on file  Occupational History  . Not on file  Tobacco Use  . Smoking status: Every Day  . Smokeless tobacco: Never  Substance and Sexual Activity  . Alcohol use: No  . Drug use: Not on file  . Sexual activity: Not on file  Other Topics Concern  . Not on file  Social History Narrative  . Not on file   Social Drivers of Health   Food Insecurity: Low Risk  (07/26/2024)   Food vital sign   . Within the past 12 months, you worried that your food would run out before you got money to buy more: Never true   . Within the past 12 months, the food you bought just didn't last and you didn't have money to get more: Never true  Transportation Needs: No Transportation Needs (07/26/2024)   Transportation   . In the past 12 months, has lack of reliable transportation kept you from medical appointments, meetings, work or from getting things needed for daily living? : No  Safety: Low Risk  (07/26/2024)   Safety   . How often does anyone, including family and friends, physically hurt you?: Never   . How often does anyone, including family and friends, insult or talk down to you?: Never   . How often does anyone, including family and friends, threaten you with harm?: Never   . How often does anyone, including family and friends, scream or curse at you?: Never  Living Situation: Low Risk  (07/26/2024)   Living Situation   . What is your living situation today?: I have a steady place to live   . Think about the place you  live. Do you have problems with any of the following? Choose all that apply:: None/None on this list      Medications Ordered Prior to Encounter[4]   Patient has no known allergies.   Most recent PHQ-2 results: Patient Health Questionnaire-2 Score: 0 (07/26/2024  1:35 PM)  Most recent PHQ-9 result: Patient Health Questionnaire-9 Score: 0 (07/26/2024  1:35 PM)  PHQ-9 Question # 9 Thoughts that you would be better off dead or hurting yourself in some way: Not at all (07/26/2024  1:35 PM)  Interpretation: PHQ-2 Interpretation: Negative (None-minimal Depression Severity) (07/26/2024  1:35 PM)  PHQ-9 Interpretation: Negative (None-minimal Depression Severity) (07/26/2024  1:35 PM)  Depression Plan: Normal/Negative Screening   No results found for this or any previous visit (from the past 12 weeks).   HPI Patient is a pleasant 73 year old female with a history of hypertension, hyperlipidemia who is here today to establish care.  Her main concern is left leg pain  x  3 months.   Patient also wanted me to know that she had a biopsy of her right breast  earlier today due to an incidental finding on her screening mammogram in 9/25.  She gets annual mammograms but this  one showed a mass that was distorted with calcifications.  Denies any discomfort at this time due to the biopsy done today.  Leg Pain  There was no injury mechanism. The quality of the pain is described as shooting and burning. The pain is severe. The pain has been Worsening since onset. Associated symptoms include an inability to bear weight, numbness and tingling. Pertinent negatives include no loss of motion, loss of sensation or muscle weakness. She reports no foreign bodies present. The symptoms are aggravated by movement. Treatments tried: lidocaine  patches, flexeril  and Gabapentin  100  to 400 mg ; Aleve. The treatment provided mild relief.  XR Lumbar spine in 8/25 showed disc space height loss at  the lower thoracic spine and  mild intervertebral disc space height  loss of L2-3.   Review of Systems  Neurological:  Positive for tingling and numbness.  All other systems reviewed and are negative.     Objective  BP 118/60 (BP Location: Left arm, Patient Position: Sitting)   Pulse 91   Ht 1.679 m (5' 6.1)   Wt 56.4 kg (124 lb 4.8 oz)   BMI 20.00 kg/m    Physical Exam Vitals and nursing note reviewed.  Constitutional:      Appearance: Normal appearance.  HENT:     Head: Normocephalic and atraumatic.     Mouth/Throat:     Mouth: Mucous membranes are moist.  Eyes:     Pupils: Pupils are equal, round, and reactive to light.  Cardiovascular:     Rate and Rhythm: Normal rate and regular rhythm.     Pulses: Normal pulses.     Heart sounds: Normal heart sounds.  Pulmonary:     Effort: Pulmonary effort is normal.     Breath sounds: Normal breath sounds.  Abdominal:     General: Abdomen is flat.     Palpations: Abdomen is soft.     Tenderness: There is no abdominal tenderness.  Musculoskeletal:        General: Normal range of motion.     Cervical back: Neck supple.     Comments: Tenderness on palpation in the mid left buttock region.  No obvious masses, erythema or increased warmth present.  Unable to do perform SLR sign due to discomfort.  Skin:    General: Skin is warm.     Capillary Refill: Capillary refill takes less than 2 seconds.  Neurological:     General: No focal deficit present.     Mental Status: She is alert and oriented to person, place, and time.  Psychiatric:        Mood and Affect: Mood normal.        Behavior: Behavior normal.       Assessment/Plan  Diagnoses and all orders for this visit:  Encounter to establish care - RTC in 3 months for follow-up of HTN.  Left leg pain -likely sciatica. Toradol  injection given today.  Patient is to discontinue Flexeril  and start Robaxin and see if she has better relief with this.  If it does not help, then CT lumbar spine is to be done  - order placed.  Education given.  RTC in 3 months for follow-up of chronic conditions -     CT Spine Lumbar WO Contrast; Future -     methocarbamoL (ROBAXIN) 500 mg tablet; Take 1 tablet (500 mg total) by mouth 3 (three) times a day for 10 days. -     ketorolac  (TORADOL ) injection 15 mg  Mass of right breast, unspecified quadrant  - Pathology results pending  Electronically signed by: Elveria Tedi Kaiser, MD 07/26/2024 3:07 PM        [1] Past Medical History: Diagnosis Date  . Arthritis   . Headache   . High cholesterol   . Hypertension   [2] Past Surgical History: Procedure Laterality Date  . HYSTERECTOMY      Procedure: HYSTERECTOMY  [3] Family History Problem Relation Name Age of Onset  . Diabetes Mother    . Stroke Mother    . Cancer Sister    . Cancer Brother    . Hypertension Brother    [4] Current Outpatient Medications on File Prior to Visit  Medication Sig Dispense Refill  . cyclobenzaprine  (FLEXERIL ) 10 mg tablet Take 10 mg by mouth 3 (three) times a day as needed for muscle spasms.    . losartan  (COZAAR ) 25 mg tablet Take 25 mg by mouth daily.    . rosuvastatin  (CRESTOR ) 40 mg tablet Take 40 mg by mouth daily.    SABRA amoxicillin (AMOXIL) 250 mg/5 mL suspension 20 ml po tid x 3 days, then 10 ml po tid x 7 more days. 400 mL 0  . azithromycin (ZITHROMAX) 250 mg tablet TK 2 TS PO FOR 1 DAY THEN TK 1 T PO D FOR 4 DAYS  0   No current facility-administered medications on file prior to visit.

## 2024-07-27 LAB — SURGICAL PATHOLOGY

## 2024-07-27 NOTE — Telephone Encounter (Signed)
 Copied from CRM #[id number]. Topic: Clinical Concerns - Medical Question >> Jul 27, 2024 10:12 AM Geni GRADE wrote: [place], [facility] is calling for clinical concerns (Ask: What symptoms are you calling about today, AND how long have you had these symptoms? Must Review HPKW list for symptoms) Document Name of Triage Nurse/BH Rep taking the call when applicable)   Include all details related to the request(s) below: Patient would like to speak with provider about biopsy results. States an appointment has been scheduled with oncology. States that she would like to know if she should get a second opinion. Would like to know if she should go to meeting with the [facility].     Confirm and type the Best Contact Number below:  Patient/caller contact number:  [phone]           [] Home  [x] Mobile  [] Work [] Other   [x] Okay to leave a voicemail   Medication List:  Current Outpatient Medications:  .  amoxicillin (AMOXIL) 250 mg/5 mL suspension, 20 ml po tid x 3 days, then 10 ml po tid x 7 more days., Disp: 400 mL, Rfl: 0 .  azithromycin (ZITHROMAX) 250 mg tablet, TK 2 TS PO FOR 1 DAY THEN TK 1 T PO D FOR 4 DAYS, Disp: , Rfl: 0 .  cyclobenzaprine  (FLEXERIL ) 10 mg tablet, Take 10 mg by mouth 3 (three) times a day as needed for muscle spasms., Disp: , Rfl:  .  losartan  (COZAAR ) 25 mg tablet, Take 25 mg by mouth daily., Disp: , Rfl:  .  methocarbamoL (ROBAXIN) 500 mg tablet, Take 1 tablet (500 mg total) by mouth 3 (three) times a day for 10 days., Disp: 30 tablet, Rfl: 0 .  rosuvastatin  (CRESTOR ) 40 mg tablet, Take 40 mg by mouth daily., Disp: , Rfl:  No current facility-administered medications for this visit.     Medication Request/Refills: Pharmacy Information (if applicable)   [x] Not Applicable       []  Pharmacy listed  Send Medication Request to:                                                 [] Pharmacy not listed (added to pharmacy list in Epic) Send Medication Request to:       Listed Pharmacies: [id number] [place], [place] - [address] AT [facility] [address] [facility] [address] - PHONE: [phone] - FAX: [phone]

## 2024-07-28 ENCOUNTER — Encounter: Payer: Self-pay | Admitting: *Deleted

## 2024-07-28 DIAGNOSIS — C50411 Malignant neoplasm of upper-outer quadrant of right female breast: Secondary | ICD-10-CM | POA: Insufficient documentation

## 2024-07-31 ENCOUNTER — Other Ambulatory Visit: Payer: Self-pay | Admitting: Nurse Practitioner

## 2024-07-31 DIAGNOSIS — I10 Essential (primary) hypertension: Secondary | ICD-10-CM

## 2024-07-31 DIAGNOSIS — E1169 Type 2 diabetes mellitus with other specified complication: Secondary | ICD-10-CM

## 2024-08-01 ENCOUNTER — Telehealth: Payer: Self-pay

## 2024-08-01 ENCOUNTER — Encounter: Payer: Self-pay | Admitting: General Surgery

## 2024-08-01 DIAGNOSIS — M545 Low back pain, unspecified: Secondary | ICD-10-CM | POA: Diagnosis not present

## 2024-08-01 NOTE — Telephone Encounter (Signed)
 Left message on voicemail about upcoming appointment with breast clinic on 10/15

## 2024-08-01 NOTE — Progress Notes (Signed)
 Radiation Oncology         [phone] ________________________________  Initial Outpatient Consultation  Name: Emily Hayes MRN: [id number]  Date: 08/02/2024  DOB: 18-May-1951  CC:No primary care provider on file.  Aron Shoulders, MD   REFERRING PHYSICIAN: Aron Shoulders, MD  DIAGNOSIS: No diagnosis found.   Cancer Staging  No matching staging information was found for the patient.   Stage *** Right Breast UOQ, Invasive ductal carcinoma with focal intermediate grade DCIS, ER+ / PR- / Her2 equivocal (FISH analysis pending), Grade 2  CHIEF COMPLAINT: Here to discuss management of right breast cancer  HISTORY OF PRESENT ILLNESS::Emily Hayes is a 73 y.o. female who presented with a right breast abnormality on the following imaging: bilateral screening mammogram on the date of 07/07/24. No symptoms, if any, were reported at that time. She then presented for a right breast diagnostic mammogram and right breast ultrasound on 07/19/24 which further revealed: a highly suspicious 1.9 cm mass in the 10 o'clock right breast located 6 cmfn; suspicious calcifications in the lateral and upper outer right breast spanning at least 5 cm anteriorly to posteriorly; and a likely benign 1 mm group of calcifications in the medial right breast. Imaging otherwise showed no evidence of right axillary lymphadenopathy.   Biopsies collected on 07/26/24 are detailed as follows:  -- Biopsy of the 10 o'clock right breast located 6 cmfn showed: grade 2 invasive ductal carcinoma measuring 1.3 cm in the greatest linear extent of the sample. ER status: 100% positive with strong staining intensity; PR status: 0% negative; Proliferation marker Ki67 at 30%; Her2 equivocal (FISH analysis pending); Grade 2. -- Biopsy of the central right breast calcifications showed focal intermediate grade DCIS measuring 4 mm in the greatest linear extent of the sample, (along with extensive stromal fibrosis w/ focal UDH, and  microcalcifications present within the DCIS and UDH).  -- No lymph nodes were examined.   ***  PREVIOUS RADIATION THERAPY: No  PAST MEDICAL HISTORY:  has a past medical history of Cervical radiculopathy, Fatigue, Generalized osteoarthrosis, HTN (hypertension), Hyperlipidemia, Lateral epicondylitis of left elbow (06/28/2017), Left knee pain, Menopause, Neck pain, Pneumonia, Pre-diabetes, Sciatica, Swelling of left knee joint, and Uterine cancer (HCC).    PAST SURGICAL HISTORY: Past Surgical History:  Procedure Laterality Date   ABCESS DRAINAGE Left 09/15/2017   behind tonsil   APPENDECTOMY     BREAST BIOPSY Right 07/26/2024   US  RT BREAST BX W LOC DEV 1ST LESION IMG BX SPEC US  GUIDE 07/26/2024 [facility]   BREAST BIOPSY Right 07/26/2024   MM RT BREAST BX W LOC DEV 1ST LESION IMAGE BX SPEC STEREO GUIDE 07/26/2024 [facility]   EXCISION METACARPAL MASS Right 12/29/2023   Procedure: EXCISION METACARPAL MASS;  Surgeon: Jerri Kay HERO, MD;  Location: [facility];  Service: Orthopedics;  Laterality: Right;  right thumb and long finger cyst removal   VAGINAL HYSTERECTOMY  1983    FAMILY HISTORY: family history includes Alzheimer's disease in her mother; Cancer in her sister, sister, sister, and sister; Colon polyps in her brother; Dementia in her mother; Diabetes in her mother; Hyperlipidemia in her brother; Hypertension in her brother; Kidney failure in her brother; Lung cancer in her brother; Stroke in her mother.  SOCIAL HISTORY:  reports that she has been smoking cigarettes. She has a 25 pack-year smoking history. She has never used smokeless tobacco. She reports that she does not drink alcohol and does not use drugs.  ALLERGIES: Patient has  no known allergies.  MEDICATIONS:  Current Outpatient Medications  Medication Sig Dispense Refill   acetaminophen  (TYLENOL ) 500 MG tablet Take 500 mg by mouth daily as needed.     aspirin  EC 81 MG tablet Take 1 tablet (81 mg  total) by mouth daily. Swallow whole.     B Complex-Folic Acid (B COMPLEX-VITAMIN B12 PO) Take 1 tablet by mouth daily.     Calcium  Citrate-Vitamin D (CALCIUM  CITRATE + D PO) Take 1 tablet by mouth 2 (two) times daily. (Patient taking differently: Take 1 tablet by mouth daily at 6 (six) AM.)     cyclobenzaprine  (FLEXERIL ) 10 MG tablet Take 1 tablet (10 mg total) by mouth 2 (two) times daily as needed for muscle spasms. 180 tablet 1   diphenhydrAMINE (BENADRYL) 25 MG tablet Take 25 mg by mouth as needed.      gabapentin  (NEURONTIN ) 100 MG capsule Take 1-2 capsules (100-200 mg total) by mouth 2 (two) times daily as needed. 120 capsule 1   gabapentin  (NEURONTIN ) 300 MG capsule Take 1 capsule (300 mg total) by mouth 2 (two) times daily. 60 capsule 3   losartan  (COZAAR ) 25 MG tablet Take 1 tablet (25 mg total) by mouth daily. 90 tablet 1   Magnesium Carbonate, Antacid, (MAGNESIUM CARBONATE PO) Take 240 mg by mouth 2 (two) times daily.     Misc Natural Products (COLON CLEANSE) CAPS Take by mouth as needed. As needed     rosuvastatin  (CRESTOR ) 40 MG tablet Take 1 tablet (40 mg total) by mouth daily. 90 tablet 1   Simethicone (GAS RELIEF EXTRA STRENGTH PO) Take 1 capsule by mouth as needed.     vitamin E 400 UNIT capsule Take 1,200 Units by mouth daily.      No current facility-administered medications for this encounter.    REVIEW OF SYSTEMS: As above in HPI.   PHYSICAL EXAM:  vitals were not taken for this visit.   General: Alert and oriented, in no acute distress HEENT: Head is normocephalic. Extraocular movements are intact.  Heart: Regular in rate and rhythm with no murmurs, rubs, or gallops. Chest: Clear to auscultation bilaterally, with no rhonchi, wheezes, or rales. Abdomen: Soft, nontender, nondistended, with no rigidity or guarding. Extremities: No cyanosis or edema. Skin: No concerning lesions. Musculoskeletal: symmetric strength and muscle tone throughout. Neurologic: Cranial nerves  II through XII are grossly intact. No obvious focalities. Speech is fluent. Coordination is intact. Psychiatric: Judgment and insight are intact. Affect is appropriate. Breasts: *** . No other palpable masses appreciated in the breasts or axillae *** .    ECOG = ***  0 - Asymptomatic (Fully active, able to carry on all predisease activities without restriction)  1 - Symptomatic but completely ambulatory (Restricted in physically strenuous activity but ambulatory and able to carry out work of a light or sedentary nature. For example, light housework, office work)  2 - Symptomatic, <50% in bed during the day (Ambulatory and capable of all self care but unable to carry out any work activities. Up and about more than 50% of waking hours)  3 - Symptomatic, >50% in bed, but not bedbound (Capable of only limited self-care, confined to bed or chair 50% or more of waking hours)  4 - Bedbound (Completely disabled. Cannot carry on any self-care. Totally confined to bed or chair)  5 - Death   Raylene MM, Creech RH, Tormey DC, et al. [phone]). Toxicity and response criteria of the [facility]. Am. DOROTHA Bridges. Oncol.  5 (6): 649-55   LABORATORY DATA:   CBC    Component Value Date/Time   WBC 6.1 05/30/2024 0840   RBC 4.55 05/30/2024 0840   HGB 12.0 05/30/2024 0840   HCT 39.8 05/30/2024 0840   PLT 243 05/30/2024 0840   MCV 87.5 05/30/2024 0840   MCH 26.4 (L) 05/30/2024 0840   MCHC 30.2 (L) 05/30/2024 0840   RDW 14.2 05/30/2024 0840   LYMPHSABS 2,459 06/28/2023 0940   EOSABS 110 05/30/2024 0840   BASOSABS 43 05/30/2024 0840    CMP     Component Value Date/Time   NA 138 05/30/2024 0840   NA 141 01/19/2017 0000   NA 141 01/19/2017 0000   K 4.3 05/30/2024 0840   CL 102 05/30/2024 0840   CO2 27 05/30/2024 0840   GLUCOSE 102 (H) 05/30/2024 0840   BUN 13 05/30/2024 0840   BUN 17 01/19/2017 0000   BUN 17 01/19/2017 0000   CREATININE 0.81 05/30/2024 0840   CALCIUM  9.9  05/30/2024 0840   PROT 6.8 05/30/2024 0840   AST 18 05/30/2024 0840   ALT 10 05/30/2024 0840   ALKPHOS 72 01/19/2017 0000   ALKPHOS 72 01/19/2017 0000   BILITOT 0.4 05/30/2024 0840   EGFR 82 09/27/2023 0815   GFRNONAA 80 12/13/2020 0000      RADIOGRAPHY: US  RT BREAST BX W LOC DEV 1ST LESION IMG BX SPEC US  GUIDE Addendum Date: 07/27/2024 ADDENDUM REPORT: 07/27/2024 12:59 ADDENDUM: PATHOLOGY revealed: Site 1. Breast, right, needle core biopsy, 10:00 6 cmfn (clip heart) - INVASIVE MODERATELY DIFFERENTIATED DUCTAL ADENOCARCINOMA, GRADE 2 (3+2+1) - OVERALL GRADE: GRADE 2 (6/9)- NEGATIVE FOR ANGIOLYMPHATIC INVASION TUMOR MEASURES 13 MM IN GREATEST LINEAR EXTENT ADJACENT FIBROCYSTIC CHANGES INCLUDING STROMAL FIBROSIS AND ADENOSIS MICROCALCIFICATIONS PRESENT WITHIN BENIGN GNOSIS. Pathology results are CONCORDANT with imaging findings, per Dr. Aliene Mir. PATHOLOGY revealed: Site 2. Breast, right, needle core biopsy, calcifications, central, (clip x)- FOCAL DUCTAL CARCINOMA IN SITU, INTERMEDIATE NUCLEAR GRADE, CRIBRIFORM AND SOLID TYPES WITHOUT NECROSIS NEGATIVE FOR INVASIVE CARCINOMA DCIS MEASURES 4 MM IN GREATEST LINEAR EXTENT EXTENSIVE STROMAL FIBROSIS WITH FOCAL USUAL DUCT HYPERPLASIA MICROCALCIFICATIONS PRESENT WITHIN DCIS AND USUAL DUCT HYPERPLASIA. Pathology results are CONCORDANT with imaging findings, per Dr. Aliene Mir. Pathology results and recommendations below were discussed with patient by telephone on 07/27/2024 by Mliss Molt RN. Patient reported biopsy site within normal limits with slight tenderness at the site. Post biopsy care instructions were reviewed, questions were answered and my direct phone number was provided to patient. Patient was instructed to call [facility] if any concerns or questions arise related to the biopsy. RECOMMENDATION: 1. Surgical and oncological consultation. The patient was referred to [facility]  [facility] on 08/02/24. 2. Recommend pretreatment bilateral breast MRI with and without contrast to determine extent of breast disease given diagnosis. Pathology results reported by Mliss Molt RN 07/27/2024. Electronically Signed   By: Aliene Lloyd M.D.   On: 07/27/2024 12:59   Result Date: 07/27/2024 CLINICAL DATA:  73 year old woman with highly suspicious RIGHT breast mass and suspicious calcifications of the RIGHT breast presents for ultrasound and stereotactic guided core needle biopsy. EXAM: ULTRASOUND GUIDED RIGHT BREAST CORE NEEDLE BIOPSY STEREOTACTIC GUIDED RIGHT BREAST CORE NEEDLE BIOPSY COMPARISON:  Previous exam(s). PROCEDURE: I met with the patient and we discussed the procedure of stereotactic guided and ultrasound-guided biopsies, including benefits and alternatives. We discussed the high likelihood of a successful procedure. We discussed the  risks of the procedure, including infection, bleeding, tissue injury, clip migration, and inadequate sampling. Informed written consent was given. The usual time-out protocol was performed immediately prior to the procedure. SITE 1: 1.9 CM RIGHT BREAST MASS (10 O'CLOCK 6 CMFN) Lesion quadrant: Upper outer quadrant Using sterile technique and 1% Lidocaine  as local anesthetic, under direct ultrasound visualization, a 14 gauge spring-loaded device was used to perform biopsy of 1.9 cm RIGHT breast mass using a lateral approach. At the conclusion of the procedure heart shaped tissue marker clip was deployed into the biopsy cavity. Follow up 2 view mammogram was performed and dictated separately. SITE 2: 0.6 CM GROUP OF CENTRAL RIGHT BREAST CALCIFICATIONS Lesion quadrant: Upper inner quadrant Using sterile technique and 1% Lidocaine  as local anesthetic, under stereotactic guidance, a 9 gauge vacuum assisted device was used to perform core needle biopsy of calcifications in the upper inner quadrant of the RIGHT breast using a lateral approach.  Specimen radiograph was performed showing calcifications. Specimens with calcifications are identified for pathology. At the conclusion of the procedure, X shaped tissue marker clip was deployed into the biopsy cavity. Follow-up 2-view mammogram was performed and dictated separately. IMPRESSION: Ultrasound guided biopsy of 1.9 cm RIGHT breast mass (10 o'clock 6 CMFN) and stereotactic guided biopsy of 0.6 cm group of central RIGHT breast calcifications. No apparent complications. Electronically Signed: By: Aliene Lloyd M.D. On: 07/26/2024 09:05   MM RT BREAST BX W LOC DEV 1ST LESION IMAGE BX SPEC STEREO GUIDE Addendum Date: 07/27/2024 ADDENDUM REPORT: 07/27/2024 12:59 ADDENDUM: PATHOLOGY revealed: Site 1. Breast, right, needle core biopsy, 10:00 6 cmfn (clip heart) - INVASIVE MODERATELY DIFFERENTIATED DUCTAL ADENOCARCINOMA, GRADE 2 (3+2+1) - OVERALL GRADE: GRADE 2 (6/9)- NEGATIVE FOR ANGIOLYMPHATIC INVASION TUMOR MEASURES 13 MM IN GREATEST LINEAR EXTENT ADJACENT FIBROCYSTIC CHANGES INCLUDING STROMAL FIBROSIS AND ADENOSIS MICROCALCIFICATIONS PRESENT WITHIN BENIGN GNOSIS. Pathology results are CONCORDANT with imaging findings, per Dr. Aliene Mir. PATHOLOGY revealed: Site 2. Breast, right, needle core biopsy, calcifications, central, (clip x)- FOCAL DUCTAL CARCINOMA IN SITU, INTERMEDIATE NUCLEAR GRADE, CRIBRIFORM AND SOLID TYPES WITHOUT NECROSIS NEGATIVE FOR INVASIVE CARCINOMA DCIS MEASURES 4 MM IN GREATEST LINEAR EXTENT EXTENSIVE STROMAL FIBROSIS WITH FOCAL USUAL DUCT HYPERPLASIA MICROCALCIFICATIONS PRESENT WITHIN DCIS AND USUAL DUCT HYPERPLASIA. Pathology results are CONCORDANT with imaging findings, per Dr. Aliene Mir. Pathology results and recommendations below were discussed with patient by telephone on 07/27/2024 by Mliss Molt RN. Patient reported biopsy site within normal limits with slight tenderness at the site. Post biopsy care instructions were reviewed, questions were answered and my direct phone number  was provided to patient. Patient was instructed to call [facility] if any concerns or questions arise related to the biopsy. RECOMMENDATION: 1. Surgical and oncological consultation. The patient was referred to [facility] on 08/02/24. 2. Recommend pretreatment bilateral breast MRI with and without contrast to determine extent of breast disease given diagnosis. Pathology results reported by Mliss Molt RN 07/27/2024. Electronically Signed   By: Aliene Lloyd M.D.   On: 07/27/2024 12:59   Result Date: 07/27/2024 CLINICAL DATA:  73 year old woman with highly suspicious RIGHT breast mass and suspicious calcifications of the RIGHT breast presents for ultrasound and stereotactic guided core needle biopsy. EXAM: ULTRASOUND GUIDED RIGHT BREAST CORE NEEDLE BIOPSY STEREOTACTIC GUIDED RIGHT BREAST CORE NEEDLE BIOPSY COMPARISON:  Previous exam(s). PROCEDURE: I met with the patient and we discussed the procedure of stereotactic guided and ultrasound-guided biopsies, including benefits and  alternatives. We discussed the high likelihood of a successful procedure. We discussed the risks of the procedure, including infection, bleeding, tissue injury, clip migration, and inadequate sampling. Informed written consent was given. The usual time-out protocol was performed immediately prior to the procedure. SITE 1: 1.9 CM RIGHT BREAST MASS (10 O'CLOCK 6 CMFN) Lesion quadrant: Upper outer quadrant Using sterile technique and 1% Lidocaine  as local anesthetic, under direct ultrasound visualization, a 14 gauge spring-loaded device was used to perform biopsy of 1.9 cm RIGHT breast mass using a lateral approach. At the conclusion of the procedure heart shaped tissue marker clip was deployed into the biopsy cavity. Follow up 2 view mammogram was performed and dictated separately. SITE 2: 0.6 CM GROUP OF CENTRAL RIGHT BREAST CALCIFICATIONS  Lesion quadrant: Upper inner quadrant Using sterile technique and 1% Lidocaine  as local anesthetic, under stereotactic guidance, a 9 gauge vacuum assisted device was used to perform core needle biopsy of calcifications in the upper inner quadrant of the RIGHT breast using a lateral approach. Specimen radiograph was performed showing calcifications. Specimens with calcifications are identified for pathology. At the conclusion of the procedure, X shaped tissue marker clip was deployed into the biopsy cavity. Follow-up 2-view mammogram was performed and dictated separately. IMPRESSION: Ultrasound guided biopsy of 1.9 cm RIGHT breast mass (10 o'clock 6 CMFN) and stereotactic guided biopsy of 0.6 cm group of central RIGHT breast calcifications. No apparent complications. Electronically Signed: By: Aliene Lloyd M.D. On: 07/26/2024 09:05   MM CLIP PLACEMENT RIGHT Result Date: 07/26/2024 CLINICAL DATA:  Status post ultrasound guided biopsy of RIGHT breast mass and stereotactic guided biopsy of RIGHT breast calcifications. EXAM: 3D DIAGNOSTIC RIGHT MAMMOGRAM POST ULTRASOUND AND STEREOTACTIC GUIDED BIOPSIES COMPARISON:  Previous exam(s). ACR Breast Density Category d: The breasts are extremely dense, which lowers the sensitivity of mammography. FINDINGS: 3D Mammographic images were obtained following ultrasound and stereotactic guided guided biopsies of the RIGHT breast. The biopsy marking clips are in expected position at the sites of biopsy. IMPRESSION: Appropriate positioning of the heart and X shaped shaped biopsy marking clip at the site of biopsy in the upper-outer quadrant of the RIGHT breast and central RIGHT breast respectively. Final Assessment: Post Procedure Mammograms for Marker Placement Electronically Signed   By: Aliene Lloyd M.D.   On: 07/26/2024 10:54   MM 3D DIAGNOSTIC MAMMOGRAM UNILATERAL RIGHT BREAST Result Date: 07/19/2024 CLINICAL DATA:  Recall from screening for a possible right breast  architectural distortion and right breast calcifications. EXAM: DIGITAL DIAGNOSTIC UNILATERAL RIGHT MAMMOGRAM WITH TOMOSYNTHESIS AND CAD; ULTRASOUND RIGHT BREAST LIMITED TECHNIQUE: Right digital diagnostic mammography and breast tomosynthesis was performed. The images were evaluated with computer-aided detection. ; Targeted ultrasound examination of the right breast was performed COMPARISON:  Previous exam(s). ACR Breast Density Category d: The breasts are extremely dense, which lowers the sensitivity of mammography. FINDINGS: On spot compression imaging, the possible architectural distortion noted in the posterior, upper outer right breast persists, as does an underlying irregular mass with associated calcifications. The mass measures approximately 1.7 cm in greatest dimension. On magnification imaging the calcifications noted on the screening exam are seen to better advantage. There is a 1 mm group of small amorphous calcifications in the medial breast. The remainder of the calcifications are in the lateral and upper outer breast extending from the distortion and mass towards the nipple. Calcifications are predominantly small amorphous, several areas with linear forms. Overall extent of these calcifications measures a proximally 5 cm from anterior to posterior. There additional  rod-like calcifications in the more anterolateral breast that are consistent with benign secretory calcifications. On physical exam, the entire upper outer quadrant the right breast is prominent and firm. No discrete palpable mass. Targeted ultrasound is performed, showing an irregular hypoechoic mass in the right breast at 10 o'clock, 6 cm the nipple, posterior depth, measuring 1.9 x 1.5 x 1.6 cm. This corresponds in size and shape to the mammographic mass and associated mammographic architectural distortion. There is prominent fibroglandular tissue throughout the lateral and upper outer right breast, but no additional defined sonographic  masses. Sonographic imaging of the right axilla demonstrates normal lymph nodes. No enlarged or abnormal lymph nodes. IMPRESSION: 1. Highly suspicious 1.9 cm irregular mass in the right breast at 10 o'clock, 6 cm the nipple, with associated mammographic architectural distortion. 2. Suspicious calcifications in the lateral and upper outer right breast spanning at least 5 cm from anterior to posterior. 3. Likely benign 1 mm group of calcifications in the medial right breast, only visualized discretely on the cc view. RECOMMENDATION: 1. Ultrasound-guided core needle biopsy of the 1.9 cm mass in the right breast at 10 o'clock. 2. Stereotactic core needle biopsy of the calcifications in the upper outer right breast. Recommend targeting the most suspicious linear area of calcifications. 3. If biopsy results demonstrate malignancy, as expected, in staging breast MRI would be recommended given the overall extent of the calcifications and the patient's extremely dense breasts on mammography. I have discussed the findings and recommendations with the patient. If applicable, a reminder letter will be sent to the patient regarding the next appointment. BI-RADS CATEGORY  5: Highly suggestive of malignancy. Electronically Signed   By: Alm Parkins M.D.   On: 07/19/2024 12:24   US  LIMITED ULTRASOUND INCLUDING AXILLA RIGHT BREAST Result Date: 07/19/2024 CLINICAL DATA:  Recall from screening for a possible right breast architectural distortion and right breast calcifications. EXAM: DIGITAL DIAGNOSTIC UNILATERAL RIGHT MAMMOGRAM WITH TOMOSYNTHESIS AND CAD; ULTRASOUND RIGHT BREAST LIMITED TECHNIQUE: Right digital diagnostic mammography and breast tomosynthesis was performed. The images were evaluated with computer-aided detection. ; Targeted ultrasound examination of the right breast was performed COMPARISON:  Previous exam(s). ACR Breast Density Category d: The breasts are extremely dense, which lowers the sensitivity of  mammography. FINDINGS: On spot compression imaging, the possible architectural distortion noted in the posterior, upper outer right breast persists, as does an underlying irregular mass with associated calcifications. The mass measures approximately 1.7 cm in greatest dimension. On magnification imaging the calcifications noted on the screening exam are seen to better advantage. There is a 1 mm group of small amorphous calcifications in the medial breast. The remainder of the calcifications are in the lateral and upper outer breast extending from the distortion and mass towards the nipple. Calcifications are predominantly small amorphous, several areas with linear forms. Overall extent of these calcifications measures a proximally 5 cm from anterior to posterior. There additional rod-like calcifications in the more anterolateral breast that are consistent with benign secretory calcifications. On physical exam, the entire upper outer quadrant the right breast is prominent and firm. No discrete palpable mass. Targeted ultrasound is performed, showing an irregular hypoechoic mass in the right breast at 10 o'clock, 6 cm the nipple, posterior depth, measuring 1.9 x 1.5 x 1.6 cm. This corresponds in size and shape to the mammographic mass and associated mammographic architectural distortion. There is prominent fibroglandular tissue throughout the lateral and upper outer right breast, but no additional defined sonographic masses. Sonographic imaging of the right  axilla demonstrates normal lymph nodes. No enlarged or abnormal lymph nodes. IMPRESSION: 1. Highly suspicious 1.9 cm irregular mass in the right breast at 10 o'clock, 6 cm the nipple, with associated mammographic architectural distortion. 2. Suspicious calcifications in the lateral and upper outer right breast spanning at least 5 cm from anterior to posterior. 3. Likely benign 1 mm group of calcifications in the medial right breast, only visualized discretely on  the cc view. RECOMMENDATION: 1. Ultrasound-guided core needle biopsy of the 1.9 cm mass in the right breast at 10 o'clock. 2. Stereotactic core needle biopsy of the calcifications in the upper outer right breast. Recommend targeting the most suspicious linear area of calcifications. 3. If biopsy results demonstrate malignancy, as expected, in staging breast MRI would be recommended given the overall extent of the calcifications and the patient's extremely dense breasts on mammography. I have discussed the findings and recommendations with the patient. If applicable, a reminder letter will be sent to the patient regarding the next appointment. BI-RADS CATEGORY  5: Highly suggestive of malignancy. Electronically Signed   By: Alm Parkins M.D.   On: 07/19/2024 12:24   MM 3D SCREENING MAMMOGRAM BILATERAL BREAST Result Date: 07/11/2024 CLINICAL DATA:  Screening. EXAM: DIGITAL SCREENING BILATERAL MAMMOGRAM WITH TOMOSYNTHESIS AND CAD TECHNIQUE: Bilateral screening digital craniocaudal and mediolateral oblique mammograms were obtained. Bilateral screening digital breast tomosynthesis was performed. The images were evaluated with computer-aided detection. COMPARISON:  Previous exam(s). ACR Breast Density Category d: The breasts are extremely dense, which lowers the sensitivity of mammography. FINDINGS: In the right breast, a mass with possible distortion warrants further evaluation. LATERAL RIGHT breast calcifications also warrant further characterization. Possible MEDIAL calcifications seen the CC view also additional evaluation. In the left breast, no findings suspicious for malignancy. IMPRESSION: Further evaluation is suggested for possible mass with distortion, regional calcifications in the LATERAL breast, and possible grouped calcifications medially in the right breast. RECOMMENDATION: Diagnostic mammogram and possibly ultrasound of the right breast. (Code:[id number]) The patient will be contacted regarding the  findings, and additional imaging will be scheduled. BI-RADS CATEGORY  0: Incomplete: Need additional imaging evaluation. Electronically Signed   By: Norleen Croak M.D.   On: 07/11/2024 11:22      IMPRESSION/PLAN: ***   It was a pleasure meeting the patient today. We discussed the risks, benefits, and side effects of radiotherapy. I recommend radiotherapy to the *** to reduce her risk of locoregional recurrence by 2/3.  We discussed that radiation would take approximately *** weeks to complete and that I would give the patient a few weeks to heal following surgery before starting treatment planning. *** If chemotherapy were to be given, this would precede radiotherapy. We spoke about acute effects including skin irritation and fatigue as well as much less common late effects including internal organ injury or irritation. We spoke about the latest technology that is used to minimize the risk of late effects for patients undergoing radiotherapy to the breast or chest wall. No guarantees of treatment were given. The patient is enthusiastic about proceeding with treatment. I look forward to participating in the patient's care.  I will await her referral back to me for postoperative follow-up and eventual CT simulation/treatment planning.  On date of service, in total, I spent *** minutes on this encounter. Patient was seen in person.   __________________________________________   Lauraine Golden, MD  This document serves as a record of services personally performed by Lauraine Golden, MD. It was created on her behalf by [facility]  Vivian, a trained medical scribe. The creation of this record is based on the scribe's personal observations and the provider's statements to them. This document has been checked and approved by the attending provider.

## 2024-08-02 ENCOUNTER — Encounter: Payer: Self-pay | Admitting: *Deleted

## 2024-08-02 ENCOUNTER — Inpatient Hospital Stay

## 2024-08-02 ENCOUNTER — Inpatient Hospital Stay: Admitting: Licensed Clinical Social Worker

## 2024-08-02 ENCOUNTER — Ambulatory Visit
Admission: RE | Admit: 2024-08-02 | Discharge: 2024-08-02 | Disposition: A | Discharge status: Home / Self Care | Source: Ambulatory Visit | Attending: Radiation Oncology | Admitting: Radiation Oncology

## 2024-08-02 ENCOUNTER — Ambulatory Visit: Admitting: Physical Therapy

## 2024-08-02 ENCOUNTER — Telehealth: Payer: Self-pay | Admitting: Nurse Practitioner

## 2024-08-02 ENCOUNTER — Inpatient Hospital Stay: Attending: Hematology and Oncology | Admitting: Hematology and Oncology

## 2024-08-02 VITALS — BP 162/66 | HR 72 | Temp 98.2°F | Resp 16 | Ht 66.14 in | Wt 122.9 lb

## 2024-08-02 DIAGNOSIS — C50411 Malignant neoplasm of upper-outer quadrant of right female breast: Secondary | ICD-10-CM

## 2024-08-02 DIAGNOSIS — C569 Malignant neoplasm of unspecified ovary: Secondary | ICD-10-CM

## 2024-08-02 DIAGNOSIS — Z808 Family history of malignant neoplasm of other organs or systems: Secondary | ICD-10-CM | POA: Diagnosis not present

## 2024-08-02 DIAGNOSIS — Z8543 Personal history of malignant neoplasm of ovary: Secondary | ICD-10-CM | POA: Diagnosis not present

## 2024-08-02 DIAGNOSIS — C50811 Malignant neoplasm of overlapping sites of right female breast: Secondary | ICD-10-CM | POA: Diagnosis not present

## 2024-08-02 DIAGNOSIS — Z801 Family history of malignant neoplasm of trachea, bronchus and lung: Secondary | ICD-10-CM

## 2024-08-02 DIAGNOSIS — M5432 Sciatica, left side: Secondary | ICD-10-CM | POA: Diagnosis not present

## 2024-08-02 DIAGNOSIS — Z170 Estrogen receptor positive status [ER+]: Secondary | ICD-10-CM | POA: Insufficient documentation

## 2024-08-02 DIAGNOSIS — Z809 Family history of malignant neoplasm, unspecified: Secondary | ICD-10-CM

## 2024-08-02 DIAGNOSIS — R9230 Dense breasts, unspecified: Secondary | ICD-10-CM | POA: Diagnosis not present

## 2024-08-02 LAB — CBC WITH DIFFERENTIAL (CANCER CENTER ONLY)
Abs Immature Granulocytes: 0.01 K/uL (ref 0.00–0.07)
Basophils Absolute: 0.1 K/uL (ref 0.0–0.1)
Basophils Relative: 1 %
Eosinophils Absolute: 0.1 K/uL (ref 0.0–0.5)
Eosinophils Relative: 2 %
HCT: 34.9 % — ABNORMAL LOW (ref 36.0–46.0)
Hemoglobin: 11.2 g/dL — ABNORMAL LOW (ref 12.0–15.0)
Immature Granulocytes: 0 %
Lymphocytes Relative: 40 %
Lymphs Abs: 2.6 K/uL (ref 0.7–4.0)
MCH: 26.9 pg (ref 26.0–34.0)
MCHC: 32.1 g/dL (ref 30.0–36.0)
MCV: 83.9 fL (ref 80.0–100.0)
Monocytes Absolute: 0.5 K/uL (ref 0.1–1.0)
Monocytes Relative: 8 %
Neutro Abs: 3.3 K/uL (ref 1.7–7.7)
Neutrophils Relative %: 49 %
Platelet Count: 239 K/uL (ref 150–400)
RBC: 4.16 MIL/uL (ref 3.87–5.11)
RDW: 15.3 % (ref 11.5–15.5)
WBC Count: 6.6 K/uL (ref 4.0–10.5)
nRBC: 0 % (ref 0.0–0.2)

## 2024-08-02 LAB — GENETIC SCREENING ORDER

## 2024-08-02 LAB — CMP (CANCER CENTER ONLY)
ALT: 15 U/L (ref 0–44)
AST: 22 U/L (ref 15–41)
Albumin: 4.5 g/dL (ref 3.5–5.0)
Alkaline Phosphatase: 54 U/L (ref 38–126)
Anion gap: 5 (ref 5–15)
BUN: 15 mg/dL (ref 8–23)
CO2: 32 mmol/L (ref 22–32)
Calcium: 10.1 mg/dL (ref 8.9–10.3)
Chloride: 103 mmol/L (ref 98–111)
Creatinine: 0.73 mg/dL (ref 0.44–1.00)
GFR, Estimated: >60 mL/min (ref >60)
Glucose, Bld: 128 mg/dL — ABNORMAL HIGH (ref 70–99)
Potassium: 4.3 mmol/L (ref 3.5–5.1)
Sodium: 140 mmol/L (ref 135–145)
Total Bilirubin: 0.3 mg/dL (ref 0.0–1.2)
Total Protein: 7 g/dL (ref 6.5–8.1)

## 2024-08-02 NOTE — Research (Signed)
 [employer] 2021-05 - Specimen Collection Study to Evaluate Biomarkers in Subjects with Cancer    Patient Emily Hayes was identified by Dr. Loretha as a potential candidate for the above listed study.  This Clinical Research Coordinator met with Emily Hayes, [id number], on 08/02/24 in a manner and location that ensures patient privacy to discuss participation in the above listed research study.  Patient is Accompanied by daughter.  A copy of the informed consent document with embedded HIPAA language was provided to the patient.  Patient reads, speaks, and understands [place].   Patient was provided with the business card of this Coordinator and encouraged to contact the research team with any questions.  Approximately 10 minutes were spent with the patient reviewing the informed consent documents.  Patient was provided the option of taking informed consent documents home to review and was encouraged to review at their convenience with their support network, including other care providers. Patient is comfortable with making a decision regarding study participation today. Patient declined participation. Dr. Loretha notified.

## 2024-08-02 NOTE — Progress Notes (Signed)
 [facility] Clinical Social Work  Initial Assessment   Emily Hayes is a 73 y.o. year old female accompanied by daughter, Emily Hayes. Clinical Social Work was referred by [facility] for assessment of psychosocial needs.   SDOH (Social Determinants of Health) assessments performed: Yes SDOH Interventions    Flowsheet Row Office Visit from 07/03/2024 in [facility]  SDOH Interventions   Utilities Interventions Intervention Not Indicated    SDOH Screenings   Food Insecurity: No Food Insecurity (08/01/2024)  Housing: Low Risk  (08/01/2024)  Transportation Needs: No Transportation Needs (08/01/2024)  Utilities: Not At Risk (08/01/2024)  Alcohol Screen: Low Risk  (05/29/2024)  Depression (PHQ2-9): Low Risk  (08/02/2024)  Financial Resource Strain: Low Risk  (05/29/2024)  Physical Activity: Sufficiently Active (10/01/2023)  Social Connections: Socially Isolated (05/29/2024)  Stress: No Stress Concern Present (10/01/2023)  Tobacco Use: High Risk (08/02/2024)   Received from [facility]    PHQ 2/9:    08/02/2024    1:49 PM 07/03/2024    8:20 AM 12/13/2023    1:42 PM  Depression screen PHQ 2/9  Decreased Interest 0 0 0  Down, Depressed, Hopeless 0 0 0  PHQ - 2 Score 0 0 0     Distress Screen completed: No     No data to display            Family/Social Information:  Housing Arrangement: patient lives alone. Daughter Emily Hayes lives in [place] Family members/support persons in your life? Daughter, Emily Hayes, and her girlfriend Emily Hayes. Neighbors/friends Transportation concerns: not currently- daughter & her partner plan to assist when possible, pt is able to drive currently but may have trouble for certain appointments due to back issues   Employment: Retired.  Income source: Actor concerns: No Type of concern: None Food access concerns: no Religious or spiritual practice: Not known Advanced directives:  Yes-living will. May want to update- discussed AD clinic through [facility] Currently in place:  [facility] Medicare  Coping/ Adjustment to diagnosis: Patient understands treatment plan and what happens next? yes, will be having scans and then determining final plan, but understands the general next steps. She is hoping that she will not need chemo.  Concerns about diagnosis and/or treatment: Being sick (vomiting) if needing chemo; daughter's concern of care for pt if they are unable to be there for any reason Patient reported stressors: Adjusting to my illness Hopes and/or priorities: hopes to avoid chemo Current coping skills/ strengths: Capable of independent living  and Supportive family/friends     SUMMARY: Current SDOH Barriers:  No current barriers. Discussed potential transportation concerns & care needs  Clinical Social Work Clinical Goal(s):  No clinical social work goals at this time  Interventions: Discussed common feeling and emotions when being diagnosed with cancer, and the importance of support during treatment Informed patient of the support team roles and support services at [facility] Provided CSW contact information and encouraged patient to call with any questions or concerns Provided patient with information about transportation resources (insurance; [facility]; [facility]) and in-home care    Follow Up Plan: Patient will contact CSW with any support or resource needs Patient verbalizes understanding of plan: Yes    Mardy Hoppe E Shaquna Geigle, LCSW Clinical Social Worker [facility]

## 2024-08-02 NOTE — Telephone Encounter (Signed)
 Delivery Service dropped off surgical pathology report.  Given to CI.

## 2024-08-02 NOTE — Telephone Encounter (Signed)
 This is no longer our patient. Paperwork placed in outgoing faxes to send to [facility] Surgical Pathology Department

## 2024-08-02 NOTE — Progress Notes (Unsigned)
 REFERRING PROVIDER: Aron Shoulders, MD [address] [place],  [place] [address]  PRIMARY PROVIDER:  No primary care provider on file.  PRIMARY REASON FOR VISIT:  1. Malignant neoplasm of upper-outer quadrant of right breast in female, estrogen receptor positive (HCC)   2. Malignant neoplasm of ovary, unspecified laterality (HCC)   3. Family history of cancer   4. Family history of lung cancer     HISTORY OF PRESENT ILLNESS:   Emily Hayes, a 73 y.o. female, was seen on 08/02/2024 for a [facility] cancer genetics consultation in the [facility] at the request of Dr. Aron due to a personal history of breast and ovarian cancer.  Emily Hayes presents to clinic today to discuss the possibility of a hereditary predisposition to cancer, genetic testing, and to further clarify her future cancer risks, as well as potential cancer risks for family members.   CANCER HISTORY:  Oncology History  Malignant neoplasm of upper-outer quadrant of right breast in female, estrogen receptor positive (HCC)  07/07/2024 Mammogram   Highly suspicious 1.9 cm irregular mass in the right breast at 10 o'clock, 6 cm the nipple, with associated mammographic architectural distortion. 2. Suspicious calcifications in the lateral and upper outer right breast spanning at least 5 cm from anterior to posterior. 3. Likely benign 1 mm group of calcifications in the medial right breast, only visualized discretely on the cc view.   07/26/2024 Pathology Results   1. Breast, right, needle core biopsy, 10:00 6 cmfn (clip heart) :      INVASIVE MODERATELY DIFFERENTIATED DUCTAL ADENOCARCINOMA, GRADE 2 (3+2+1)      TUBULE FORMATION: SCORE 3      NUCLEAR PLEOMORPHISM: SCORE 2      MITOTIC COUNT: SCORE 1      TOTAL SCORE: 6      OVERALL GRADE: GRADE 2 (6/9)      NEGATIVE FOR ANGIOLYMPHATIC INVASION      TUMOR MEASURES 13 MM IN GREATEST LINEAR EXTENT      ADJACENT FIBROCYSTIC CHANGES INCLUDING  STROMAL FIBROSIS AND ADENOSIS      MICROCALCIFICATIONS PRESENT WITHIN BENIGN GNOSIS       2. Breast, right, needle core biopsy, calcifications, central, (clip x) :      FOCAL DUCTAL CARCINOMA IN SITU, INTERMEDIATE NUCLEAR GRADE, CRIBRIFORM AND SOLID      TYPES WITHOUT NECROSIS      NEGATIVE FOR INVASIVE CARCINOMA      DCIS MEASURES 4 MM IN GREATEST LINEAR EXTENT      EXTENSIVE STROMAL FIBROSIS WITH FOCAL USUAL DUCT HYPERPLASIA      MICROCALCIFICATIONS PRESENT WITHIN DCIS AND USUAL DUCT HYPERPLASIA    07/28/2024 Initial Diagnosis   Malignant neoplasm of upper-outer quadrant of right breast in female, estrogen receptor positive (HCC)   08/02/2024 Cancer Staging   Staging form: Breast, AJCC 8th Edition - Clinical: Stage IA (cT1c, cN0, cM0, G2, ER+, PR-, HER2-) - Signed by Loretha Ash, MD on 08/02/2024 Histologic grading system: 3 grade system     In 2025, at the age of 33, Emily Hayes was diagnosed with invasive ductal carcinoma and DCIS of the right breast. The treatment plan pending, as they depend on imaging and Oncotype DX score .   RELEVANT MEDICAL HISTORY AND RISK FACTORS:  Menarche was at age 53.  First live birth at age 35.  OCP use for approximately 20 years.  Ovaries intact: no, oophorectomy at age 62 due to ovarian cancer.  Hysterectomy: no.  Menopausal  status: postmenopausal.  HRT use: 10 years. Colonoscopy: yes; most recent in 12/2020 where 1 sessile serrated polyp was found. Mammogram within the last year: yes. Number of breast biopsies: 2. Up to date with pelvic exams: n/a. Any excessive radiation exposure in the past: no Other cancer screenings: no.   Past Medical History:  Diagnosis Date   Cervical radiculopathy    Family history of cancer    Family history of lung cancer    Fatigue    Generalized osteoarthrosis    HTN (hypertension)    Hyperlipidemia    Lateral epicondylitis of left elbow 06/28/2017   Left knee pain    Menopause    Neck pain     Ovarian cancer (HCC) 1983   Pneumonia    Pre-diabetes    Sciatica    Swelling of left knee joint     Past Surgical History:  Procedure Laterality Date   ABCESS DRAINAGE Left 09/15/2017   behind tonsil   ABDOMINAL HYSTERECTOMY  1983   APPENDECTOMY     BREAST BIOPSY Right 07/26/2024   US  RT BREAST BX W LOC DEV 1ST LESION IMG BX SPEC US  GUIDE 07/26/2024 [facility]   BREAST BIOPSY Right 07/26/2024   MM RT BREAST BX W LOC DEV 1ST LESION IMAGE BX SPEC STEREO GUIDE 07/26/2024 [facility]   EXCISION METACARPAL MASS Right 12/29/2023   Procedure: EXCISION METACARPAL MASS;  Surgeon: Jerri Kay HERO, MD;  Location: [facility];  Service: Orthopedics;  Laterality: Right;  right thumb and long finger cyst removal    Social History   Socioeconomic History   Marital status: Divorced    Spouse name: Not on file   Number of children: 2   Years of education: Not on file   Highest education level: Associate degree: academic program  Occupational History   Occupation: retired  Tobacco Use   Smoking status: Every Day    Current packs/day: 0.50    Average packs/day: 0.5 packs/day for 50.0 years (25.0 ttl pk-yrs)    Types: Cigarettes   Smokeless tobacco: Never  Vaping Use   Vaping status: Never Used  Substance and Sexual Activity   Alcohol use: No   Drug use: No   Sexual activity: Not Currently    Birth control/protection: Surgical    Comment: Hyst  Other Topics Concern   Not on file  Social History Narrative   Social History      Diet? none      Do you drink/eat things with caffeine? Chocolate- occasionally      Marital status?        divorced                            What year were you married? 1982      Do you live in a house, apartment, assisted living, condo, trailer, etc.? apartment      Is it one or more stories? 1      How many persons live in your home? 1       Do you have any pets in your home? (please list) yes, dog      Highest level of  education completed? Associate degree- college      Current or past profession: retired      Do you exercise?           yes  Type & how often? Walk- daily      Advanced Directives      Do you have a living will? yes      Do you have a DNR form?     yes                             If not, do you want to discuss one? yes      Do you have signed POA/HPOA for forms? no      Functional Status      Do you have difficulty bathing or dressing yourself?      Do you have difficulty preparing food or eating?       Do you have difficulty managing your medications?      Do you have difficulty managing your finances?      Do you have difficulty affording your medications?   Social Drivers of Corporate investment banker Strain: Low Risk  (05/29/2024)   Overall Financial Resource Strain (CARDIA)    Difficulty of Paying Living Expenses: Not hard at all  Food Insecurity: No Food Insecurity (08/01/2024)   Hunger Vital Sign    Worried About Running Out of Food in the Last Year: Never true    Ran Out of Food in the Last Year: Never true  Transportation Needs: No Transportation Needs (08/01/2024)   PRAPARE - Administrator, Civil Service (Medical): No    Lack of Transportation (Non-Medical): No  Physical Activity: Sufficiently Active (10/01/2023)   Exercise Vital Sign    Days of Exercise per Week: 7 days    Minutes of Exercise per Session: 40 min  Stress: No Stress Concern Present (10/01/2023)   [employer] of Occupational Health - Occupational Stress Questionnaire    Feeling of Stress : Not at all  Social Connections: Socially Isolated (05/29/2024)   Social Connection and Isolation Panel    Frequency of Communication with Friends and Family: Three times a week    Frequency of Social Gatherings with Friends and Family: Twice a week    Attends Religious Services: Patient declined    Database administrator or Organizations: No    Attends Museum/gallery exhibitions officer: Not on file    Marital Status: Divorced     FAMILY HISTORY:  We obtained a detailed, 4-generation family history.  Significant diagnoses are listed below: Family History  Problem Relation Age of Onset   Stroke Mother    Diabetes Mother    Dementia Mother    Alzheimer's disease Mother    Cancer Sister        unknown   Cancer Sister        unknown   Cancer Sister        unknown   Cancer Sister        brain tumor   Colon polyps Brother    Hypertension Brother    Hyperlipidemia Brother    Kidney failure Brother    Lung cancer Brother    Colon cancer Neg Hx    Esophageal cancer Neg Hx    Rectal cancer Neg Hx    Stomach cancer Neg Hx     Emily Hayes was diagnosed with ovarian cancer when she was 82. She is now diagnosed with breast cancer. She has multiple sisters who have died of unknown cancers. Her sister, Emily Hayes, had brain tumors diagnosed in her 33's. Her brother Emily Hayes had lung cancer  in his 7's  Emily Hayes is unaware of previous family history of genetic testing for hereditary cancer risks. There is no reported Ashkenazi Jewish ancestry. There is no known consanguinity.  GENETIC COUNSELING ASSESSMENT:  Emily Hayes is a 73 y.o. female with a personal and family history of breast and ovarian cancer, and a family history of unknown cancers, which is somewhat suggestive of a hereditary cancer syndrome and predisposition to cancer given this history. We, therefore, discussed and recommended the following at today's visit.   DISCUSSION: We discussed that, in general, most cancer is not inherited in families, but instead is sporadic or familial. Sporadic cancers occur by chance and typically happen at older ages (>50 years) as this type of cancer is caused by genetic changes acquired during an individual's lifetime. Some families have more cancers than would be expected by chance; however, the ages or types of cancer are not consistent with a known genetic  mutation or known genetic mutations have been ruled out. This type of familial cancer is thought to be due to a combination of multiple genetic, environmental, hormonal, and lifestyle factors. While this combination of factors likely increases the risk of cancer, the exact source of this risk is not currently identifiable or testable.  We discussed that up to 20% of ovarian is hereditary. Cases of hereditary ovarian cancer are associated with multiple genes, including the BRCA1 and BRCA2 genes. Cancer risks and management strategies are gene specific. We discussed that genetic testing can beneficial for several reasons, including clarifying specific cancer risks, identifying potential screening and risk-reduction options that may be appropriate, and to understand if other family members could be at risk for cancer and allow them to undergo genetic testing to clarify their cancer risks.   We reviewed the characteristics, features and inheritance patterns of hereditary cancer syndromes. We also discussed genetic testing, including the appropriate family members to test, the process of testing, insurance coverage and turn-around-time for results. We discussed the implications of a negative, positive, carrier and/or variant of uncertain significant result.   Ms. Yearick  was offered a the [facility] CancerNext + RNAinsight gene panel which includes sequencing, rearrangement analysis, and RNA analysis for the following 40 genes: APC, ATM, BAP1, BARD1, BMPR1A, BRCA1, BRCA2, BRIP1, CDH1, CDKN2A, CHEK2, FH, FLCN, MET, MLH1, MSH2, MSH6, MUTYH, NF1, NTHL1, PALB2, PMS2, PTEN, RAD51C, RAD51D, RPS20, SMAD4, STK11, TP53, TSC1, TSC2, and VHL (sequencing and deletion/duplication); AXIN2, HOXB13, MBD4, MSH3, POLD1 and POLE (sequencing only); EPCAM and GREM1 (deletion/duplication only).   EmilyHayes was also offered the [facility] CancerNext-Expanded + RNAinsight gene panel which includes sequencing, rearrangement, and RNA analysis  for the following 77 genes: AIP, ALK, APC, ATM, AXIN2, BAP1, BARD1, BMPR1A, BRCA1, BRCA2, BRIP1, CDC73, CDH1, CDK4, CDKN1B, CDKN2A, CEBPA, CHEK2, CTNNA1, DDX41, DICER1, ETV6, FH, FLCN, GATA2, LZTR1, MAX, MBD4, MEN1, MET, MLH1, MSH2, MSH3, MSH6, MUTYH, NF1, NF2, NTHL1, PALB2, PHOX2B, PMS2, POT1, PRKAR1A, PTCH1, PTEN, RAD51C, RAD51D, RB1, RET, RPS20, RUNX1, SDHA, SDHAF2, SDHB, SDHC, SDHD, SMAD4, SMARCA4, SMARCB1, SMARCE1, STK11, SUFU, TMEM127, TP53, TSC1, TSC2, VHL, and WT1 (sequencing and deletion/duplication); EGFR, HOXB13, KIT, MITF, PDGFRA, POLD1, and POLE (sequencing only); EPCAM and GREM1 (deletion/duplication only).     Emily Hayes was informed of the benefits and limitations of each panel, including that expanded pan-cancer panels contain genes may not have clear management guidelines at this point in time. We also discussed that as the number of genes included on a panel increases, the chances of variants of uncertain significance increases. After considering  the risks, benefits, and limitations, Emily Hayes provided informed consent to pursue genetic testing. Emily Hayes decided to pursue genetic testing for the ***[facility] BRCA Next panel and [facility] CancerNext + RNA gene panel. In order to get genetic test results in a timely manner so that Emily Hayes can use these genetic test results for surgical decisions, we recommended Emily Hayes pursue genetic testing for the ***. Once complete, we recommend Emily Hayes pursue reflex genetic testing to the *** gene panel.   Based on Emily Hayes's personal history of cancer, she meets medical criteria for genetic testing. she meets criteria due to her personal history of ovarian cancer. Despite that she meets criteria, she may still have an out of pocket cost. We discussed that if her out of pocket cost for testing is over $100, the laboratory will call and confirm whether she wants to proceed with testing.  If the out of pocket cost of testing is less than  $100 she will be billed by the genetic testing laboratory.   STAT PANEL ***We reviewed the characteristics, features and inheritance patterns of hereditary cancer syndromes. We also discussed genetic testing, including the appropriate family members to test, the process of testing, insurance coverage and turn-around-time for results. We discussed the implications of a negative, positive and/or variant of uncertain significant result.   STATISTICAL MODELING***In order to estimate her chance of having a {CA GENE:62345} mutation, we used statistical models ({GENMODELS:62370}) that consider her personal medical history, family history and ancestry.  Because each model is different, there can be a lot of variability in the risks they give.  Therefore, these numbers must be considered a rough range and not a precise risk of having a {CA GENE:62345} mutation.  These models estimate that she has approximately a ***-***% chance of having a mutation. Based on this assessment of her family and personal history, genetic testing {IS/ISNOT:34056} recommended.  ***The Tyrer-Cuzick model is one of multiple prediction models developed to estimate an individual's lifetime risk of developing breast cancer. The Tyrer-Cuzick model is endorsed by the [employer] (NCCN). This model includes many risk factors such as family history, endogenous estrogen exposure, and benign breast disease. The calculation is highly-dependent on the accuracy of clinical data provided by the patient and can change over time. The Tyrer-Cuzick model may be repeated to reflect new information in her personal or family history in the future.   ***Based on the patient's {Personal/family:20331} history, a statistical model ({GENMODELS:62370}) was used to estimate her risk of developing {CA HX:54794}. This estimates her lifetime risk of developing {CA HX:54794} to be approximately ***%. This estimation does not consider any genetic  testing results.  The patient's lifetime breast cancer risk is a preliminary estimate based on available information using one of several models endorsed by the American Cancer Society (ACS). The ACS recommends consideration of breast MRI screening as an adjunct to mammography for patients at high risk (defined as 20% or greater lifetime risk). Please note that a woman's breast cancer risk changes over time. It may increase or decrease based on age and any changes to the personal and/or family medical history. The risks and recommendations listed above apply to this patient at this point in time. In the future, she may or may not be eligible for the same medical management strategies and, in some cases, other medical management strategies may become available to her. If she is interested in an updated breast cancer risk assessment at a later date, she can  contact us .  ***Emily Hayes has been determined to be at high risk for breast cancer.  her Tyrer-Cuzick risk score is ***%.  For women with a greater than 20% lifetime risk of breast cancer, the [employer] (NCCN) recommends the following:  1.      Clinical encounter every 6-12 months to begin when identified as being at increased risk, but not before age 13  2.      Annual mammograms. Tomosynthesis is recommended starting 10 years earlier than the youngest breast cancer diagnosis in the family or at age 22 (whichever comes first), but not before age 24   53.      Annual breast MRI starting 10 years earlier than the youngest breast cancer diagnosis in the family or at age 84 (whichever comes first), but not before age 63.    PLAN: After considering the risks, benefits, and limitations, Emily Hayes provided informed consent to pursue genetic testing and the blood sample was sent to {Lab} Laboratories for analysis of the {test}. Results should be available within approximately {TAT TIME} weeks' time, at which point they will be  disclosed by telephone to Emily Hayes, as will any additional recommendations warranted by these results. Emily Hayes will receive a summary of her genetic counseling visit and a copy of her results once available. This information will also be available in Epic.   *** Despite our recommendation, Ms. Agresti did not wish to pursue genetic testing at today's visit. We understand this decision and remain available to coordinate genetic testing at any time in the future. We, therefore, recommend Ms. Poplaski continue to follow the cancer screening guidelines given by her primary healthcare provider.  ***Based on Ms. Friedel's family history, we recommended her ***, who was diagnosed with *** at age ***, have genetic counseling and testing. Ms. Raybuck will let us  know if we can be of any assistance in coordinating genetic counseling and/or testing for this family member.   RESOURCES PROVIDED:  Ms. Mertz was provided with the following:  ***[facility] Billing information  ***[facility] Contact card  ***[facility] Hereditary Cancer Testing Patient Guide  Lastly, we encouraged Ms. Milhorn to remain in contact with cancer genetics annually so that we can continuously update the family history and inform her of any changes in cancer genetics and testing that may be of benefit for this family.   Ms. Dowd questions were answered to her satisfaction today. Our contact information was provided should additional questions or concerns arise. Thank you for the referral and allowing us  to share in the care of your patient.   Bobbiejo Ishikawa R. Bluford, MS, [facility] Certified [email or web address] phone: [phone]  I personally spent a total of *** minutes in the care of the patient today including {Time Based Coding:210964241}. *** The patient was seen alone.  ***The patient brought ***. Drs. Lanny Stalls, and/or Gudena were available for questions, if needed..     _______________________________________________________________________ For Office Staff:  Number of people involved in session: *** Was an Intern/ student involved with case: {YES/NO:63}

## 2024-08-02 NOTE — Progress Notes (Signed)
 [facility] CONSULT NOTE  Patient Care Team: Joshua Rush, OD (Optometry) Gerome, Devere HERO, RN as Oncology Nurse Navigator [name], RN as Oncology Nurse Navigator Aron Shoulders, MD as Consulting Physician (General Surgery) Loretha Ash, MD as Consulting Physician (Hematology and Oncology) Izell Domino, MD as Attending Physician (Radiation Oncology)  CHIEF COMPLAINTS/PURPOSE OF CONSULTATION:  New diagnosis of right sided breast cancer  ASSESSMENT & PLAN:   Assessment and Plan Assessment & Plan Invasive ductal carcinoma and ductal carcinoma in situ (DCIS) of right breast, estrogen receptor positive, progesterone receptor negative, intermediate grade IDC stage at least 1, DCIS stage 0. IDC estrogen receptor positive, progesterone receptor negative, intermediate grade, high growth rate. High-risk features include progesterone receptor negativity and high growth rate. Cancer highly estrogen-dependent. Further imaging needed due to breast density. Genetic testing recommended due to ovarian cancer history. Treatment decisions depend on imaging and Oncotype DX score. Anti-estrogen therapy crucial to prevent recurrence. - Order breast MRI or contrast-enhanced mammogram. - Perform genetic testing for BRCA and other mutations. - Discuss lumpectomy vs mastectomy with surgical team based on imaging. - Perform Oncotype DX testing post-surgery. - Consider radiation therapy if lumpectomy performed. - Prescribe anti-estrogen therapy post-surgery. - Discuss chemotherapy need based on Oncotype DX results. - Discuss sentinel lymph node biopsy based on MRI and surgical findings.  Ovarian cancer Ovarian cancer treated with oophorectomy in 1983. No chemotherapy required. Genetic testing needed to assess contralateral breast cancer risk and inform surgical decisions. - Perform genetic testing for BRCA and other mutations.  Nicotine dependence Current smoker, half pack per day.  Smoking cessation important for health and cancer treatment outcomes.    HISTORY OF PRESENTING ILLNESS:  ILMA ACHEE 73 y.o. female is here because of new diagnosis of breast cancer  Oncology History  Malignant neoplasm of upper-outer quadrant of right breast in female, estrogen receptor positive (HCC)  07/07/2024 Mammogram   Highly suspicious 1.9 cm irregular mass in the right breast at 10 o'clock, 6 cm the nipple, with associated mammographic architectural distortion. 2. Suspicious calcifications in the lateral and upper outer right breast spanning at least 5 cm from anterior to posterior. 3. Likely benign 1 mm group of calcifications in the medial right breast, only visualized discretely on the cc view.   07/26/2024 Pathology Results   1. Breast, right, needle core biopsy, 10:00 6 cmfn (clip heart) :      INVASIVE MODERATELY DIFFERENTIATED DUCTAL ADENOCARCINOMA, GRADE 2 (3+2+1)      TUBULE FORMATION: SCORE 3      NUCLEAR PLEOMORPHISM: SCORE 2      MITOTIC COUNT: SCORE 1      TOTAL SCORE: 6      OVERALL GRADE: GRADE 2 (6/9)      NEGATIVE FOR ANGIOLYMPHATIC INVASION      TUMOR MEASURES 13 MM IN GREATEST LINEAR EXTENT      ADJACENT FIBROCYSTIC CHANGES INCLUDING STROMAL FIBROSIS AND ADENOSIS      MICROCALCIFICATIONS PRESENT WITHIN BENIGN GNOSIS       2. Breast, right, needle core biopsy, calcifications, central, (clip x) :      FOCAL DUCTAL CARCINOMA IN SITU, INTERMEDIATE NUCLEAR GRADE, CRIBRIFORM AND SOLID      TYPES WITHOUT NECROSIS      NEGATIVE FOR INVASIVE CARCINOMA      DCIS MEASURES 4 MM IN GREATEST LINEAR EXTENT      EXTENSIVE STROMAL FIBROSIS WITH FOCAL USUAL DUCT HYPERPLASIA      MICROCALCIFICATIONS PRESENT WITHIN DCIS AND USUAL  DUCT HYPERPLASIA    07/28/2024 Initial Diagnosis   Malignant neoplasm of upper-outer quadrant of right breast in female, estrogen receptor positive (HCC)   08/02/2024 Cancer Staging   Staging form: Breast, AJCC 8th Edition -  Clinical: Stage IA (cT1c, cN0, cM0, G2, ER+, PR-, HER2-) - Signed by Loretha Ash, MD on 08/02/2024 Histologic grading system: 3 grade system     Discussed the use of AI scribe software for clinical note transcription with the patient, who gave verbal consent to proceed.  History of Present Illness STORM DULSKI is a 73 year old female with a history of ovarian cancer who presents with a new diagnosis of breast cancer. She is accompanied by her daughter, Cat.  She has been diagnosed with breast cancer in her right breast, identified as invasive ductal carcinoma (IDC) and ductal carcinoma in situ (DCIS). The IDC is located at the ten o'clock position and measures approximately 1.9 cm. The DCIS is associated with calcifications throughout the right breast. She has dense breast tissue, which has been a consistent finding in her previous mammograms.  She has a history of ovarian cancer, diagnosed in 1983, for which she underwent removal of both ovaries in separate surgeries. She did not receive chemotherapy for the ovarian cancer. She took estrogen for hot flashes after her ovarian surgeries but discontinued it ten years ago.  Her family history includes a sister who had a brain tumor. 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 She wants to quit smoking.  She experiences tingling and burning sensations in her right foot, which she associates with a history of back issues. She also has postmenopausal symptoms like hot flashes, particularly at night, which she notes are influenced by her diet.  All other systems were reviewed with the patient and are negative.  MEDICAL HISTORY:  Past Medical History:  Diagnosis Date   Cervical radiculopathy    Fatigue    Generalized osteoarthrosis    HTN (hypertension)    Hyperlipidemia    Lateral epicondylitis of left elbow 06/28/2017   Left knee pain    Menopause    Neck pain    Ovarian cancer (HCC) 1983    Pneumonia    Pre-diabetes    Sciatica    Swelling of left knee joint     SURGICAL HISTORY: Past Surgical History:  Procedure Laterality Date   ABCESS DRAINAGE Left 09/15/2017   behind tonsil   ABDOMINAL HYSTERECTOMY  1983   APPENDECTOMY     BREAST BIOPSY Right 07/26/2024   US  RT BREAST BX W LOC DEV 1ST LESION IMG BX SPEC US  GUIDE 07/26/2024 [facility]   BREAST BIOPSY Right 07/26/2024   MM RT BREAST BX W LOC DEV 1ST LESION IMAGE BX SPEC STEREO GUIDE 07/26/2024 [facility]   EXCISION METACARPAL MASS Right 12/29/2023   Procedure: EXCISION METACARPAL MASS;  Surgeon: Jerri Kay HERO, MD;  Location: [facility];  Service: Orthopedics;  Laterality: Right;  right thumb and long finger cyst removal    SOCIAL HISTORY: Social History   Socioeconomic History   Marital status: Divorced    Spouse name: Not on file   Number of children: 2   Years of education: Not on file   Highest education level: Associate degree: academic program  Occupational History   Occupation: retired  Tobacco Use   Smoking status: Every Day    Current packs/day: 0.50    Average packs/day: 0.5 packs/day for 50.0 years (  25.0 ttl pk-yrs)    Types: Cigarettes   Smokeless tobacco: Never  Vaping Use   Vaping status: Never Used  Substance and Sexual Activity   Alcohol use: No   Drug use: No   Sexual activity: Not Currently    Birth control/protection: Surgical    Comment: Hyst  Other Topics Concern   Not on file  Social History Narrative   Social History      Diet? none      Do you drink/eat things with caffeine? Chocolate- occasionally      Marital status?        divorced                            What year were you married? 1982      Do you live in a house, apartment, assisted living, condo, trailer, etc.? apartment      Is it one or more stories? 1      How many persons live in your home? 1       Do you have any pets in your home? (please list) yes, dog      Highest  level of education completed? Associate degree- college      Current or past profession: retired      Do you exercise?           yes                           Type & how often? Walk- daily      Advanced Directives      Do you have a living will? yes      Do you have a DNR form?     yes                             If not, do you want to discuss one? yes      Do you have signed POA/HPOA for forms? no      Functional Status      Do you have difficulty bathing or dressing yourself?      Do you have difficulty preparing food or eating?       Do you have difficulty managing your medications?      Do you have difficulty managing your finances?      Do you have difficulty affording your medications?   Social Drivers of Corporate investment banker Strain: Low Risk  (05/29/2024)   Overall Financial Resource Strain (CARDIA)    Difficulty of Paying Living Expenses: Not hard at all  Food Insecurity: No Food Insecurity (08/01/2024)   Hunger Vital Sign    Worried About Running Out of Food in the Last Year: Never true    Ran Out of Food in the Last Year: Never true  Transportation Needs: No Transportation Needs (08/01/2024)   PRAPARE - Administrator, Civil Service (Medical): No    Lack of Transportation (Non-Medical): No  Physical Activity: Sufficiently Active (10/01/2023)   Exercise Vital Sign    Days of Exercise per Week: 7 days    Minutes of Exercise per Session: 40 min  Stress: No Stress Concern Present (10/01/2023)   [employer] of Occupational Health - Occupational Stress Questionnaire    Feeling of Stress : Not at all  Social Connections: Socially Isolated (05/29/2024)   Social  Connection and Isolation Panel    Frequency of Communication with Friends and Family: Three times a week    Frequency of Social Gatherings with Friends and Family: Twice a week    Attends Religious Services: Patient declined    Database administrator or Organizations: No    Attends  Engineer, structural: Not on file    Marital Status: Divorced  Intimate Partner Violence: Not At Risk (01/05/2018)   Humiliation, Afraid, Rape, and Kick questionnaire    Fear of Current or Ex-Partner: No    Emotionally Abused: No    Physically Abused: No    Sexually Abused: No    FAMILY HISTORY: Family History  Problem Relation Age of Onset   Stroke Mother    Diabetes Mother    Dementia Mother    Alzheimer's disease Mother    Cancer Sister        unknown   Cancer Sister        unknown   Cancer Sister        unknown   Cancer Sister        brain tumor   Colon polyps Brother    Hypertension Brother    Hyperlipidemia Brother    Kidney failure Brother    Lung cancer Brother    Colon cancer Neg Hx    Esophageal cancer Neg Hx    Rectal cancer Neg Hx    Stomach cancer Neg Hx     ALLERGIES:  has no known allergies.  MEDICATIONS:  Current Outpatient Medications  Medication Sig Dispense Refill   acetaminophen  (TYLENOL ) 500 MG tablet Take 500 mg by mouth daily as needed. (Patient not taking: Reported on 08/02/2024)     B Complex-Folic Acid (B COMPLEX-VITAMIN B12 PO) Take 1 tablet by mouth daily.     Calcium  Citrate-Vitamin D (CALCIUM  CITRATE + D PO) Take 1 tablet by mouth 2 (two) times daily.     cyanocobalamin (VITAMIN B12) 500 MCG tablet Take 500 mcg by mouth daily.     diphenhydrAMINE (BENADRYL) 25 MG tablet Take 25 mg by mouth as needed.      losartan  (COZAAR ) 25 MG tablet Take 1 tablet (25 mg total) by mouth daily. 90 tablet 1   Magnesium Carbonate, Antacid, (MAGNESIUM CARBONATE PO) Take 240 mg by mouth 2 (two) times daily.     Misc Natural Products (COLON CLEANSE) CAPS Take by mouth as needed. As needed     rosuvastatin  (CRESTOR ) 40 MG tablet Take 1 tablet (40 mg total) by mouth daily. 90 tablet 1   Simethicone (GAS RELIEF EXTRA STRENGTH PO) Take 1 capsule by mouth as needed.     vitamin E 400 UNIT capsule Take 1,200 Units by mouth daily.      No current  facility-administered medications for this visit.     PHYSICAL EXAMINATION: ECOG PERFORMANCE STATUS: 0 - Asymptomatic  Vitals:   08/02/24 0912  BP: (!) 162/66  Pulse: 72  Resp: 16  Temp: 98.2 F (36.8 C)  SpO2: 92%   Filed Weights   08/02/24 0912  Weight: 122 lb 14.4 oz (55.7 kg)    GENERAL:alert, no distress and comfortable SKIN: skin color, texture, turgor are normal, no rashes or significant lesions EYES: normal, conjunctiva are pink and non-injected, sclera clear OROPHARYNX:no exudate, no erythema and lips, buccal mucosa, and tongue normal  NECK: supple, thyroid normal size, non-tender, without nodularity LYMPH:  no palpable lymphadenopathy in the cervical, axillary LUNGS: clear to auscultation and percussion with normal  breathing effort HEART: regular rate & rhythm and no murmurs and no lower extremity edema ABDOMEN:abdomen soft, non-tender and normal bowel sounds Musculoskeletal:no cyanosis of digits and no clubbing  PSYCH: alert & oriented x 3 with fluent speech NEURO: no focal motor/sensory deficits  LABORATORY DATA:  I have reviewed the data as listed Lab Results  Component Value Date   WBC 6.6 08/02/2024   HGB 11.2 (L) 08/02/2024   HCT 34.9 (L) 08/02/2024   MCV 83.9 08/02/2024   PLT 239 08/02/2024     Chemistry      Component Value Date/Time   NA 140 08/02/2024 1255   NA 141 01/19/2017 0000   NA 141 01/19/2017 0000   K 4.3 08/02/2024 1255   CL 103 08/02/2024 1255   CO2 32 08/02/2024 1255   BUN 15 08/02/2024 1255   BUN 17 01/19/2017 0000   BUN 17 01/19/2017 0000   CREATININE 0.73 08/02/2024 1255   CREATININE 0.81 05/30/2024 0840   GLU 102 01/19/2017 0000   GLU 102 01/19/2017 0000      Component Value Date/Time   CALCIUM  10.1 08/02/2024 1255   ALKPHOS 54 08/02/2024 1255   AST 22 08/02/2024 1255   ALT 15 08/02/2024 1255   BILITOT 0.3 08/02/2024 1255       RADIOGRAPHIC STUDIES: I have personally reviewed the radiological images as listed  and agreed with the findings in the report. US  RT BREAST BX W LOC DEV 1ST LESION IMG BX SPEC US  GUIDE Addendum Date: 07/27/2024 ADDENDUM REPORT: 07/27/2024 12:59 ADDENDUM: PATHOLOGY revealed: Site 1. Breast, right, needle core biopsy, 10:00 6 cmfn (clip heart) - INVASIVE MODERATELY DIFFERENTIATED DUCTAL ADENOCARCINOMA, GRADE 2 (3+2+1) - OVERALL GRADE: GRADE 2 (6/9)- NEGATIVE FOR ANGIOLYMPHATIC INVASION TUMOR MEASURES 13 MM IN GREATEST LINEAR EXTENT ADJACENT FIBROCYSTIC CHANGES INCLUDING STROMAL FIBROSIS AND ADENOSIS MICROCALCIFICATIONS PRESENT WITHIN BENIGN GNOSIS. Pathology results are CONCORDANT with imaging findings, per Dr. Aliene Mir. PATHOLOGY revealed: Site 2. Breast, right, needle core biopsy, calcifications, central, (clip x)- FOCAL DUCTAL CARCINOMA IN SITU, INTERMEDIATE NUCLEAR GRADE, CRIBRIFORM AND SOLID TYPES WITHOUT NECROSIS NEGATIVE FOR INVASIVE CARCINOMA DCIS MEASURES 4 MM IN GREATEST LINEAR EXTENT EXTENSIVE STROMAL FIBROSIS WITH FOCAL USUAL DUCT HYPERPLASIA MICROCALCIFICATIONS PRESENT WITHIN DCIS AND USUAL DUCT HYPERPLASIA. Pathology results are CONCORDANT with imaging findings, per Dr. Aliene Mir. Pathology results and recommendations below were discussed with patient by telephone on 07/27/2024 by Mliss Molt RN. Patient reported biopsy site within normal limits with slight tenderness at the site. Post biopsy care instructions were reviewed, questions were answered and my direct phone number was provided to patient. Patient was instructed to call [facility] if any concerns or questions arise related to the biopsy. RECOMMENDATION: 1. Surgical and oncological consultation. The patient was referred to [facility] on 08/02/24. 2. Recommend pretreatment bilateral breast MRI with and without contrast to determine extent of breast disease given diagnosis. Pathology results reported by Mliss Molt RN  07/27/2024. Electronically Signed   By: Aliene Lloyd M.D.   On: 07/27/2024 12:59   Result Date: 07/27/2024 CLINICAL DATA:  73 year old woman with highly suspicious RIGHT breast mass and suspicious calcifications of the RIGHT breast presents for ultrasound and stereotactic guided core needle biopsy. EXAM: ULTRASOUND GUIDED RIGHT BREAST CORE NEEDLE BIOPSY STEREOTACTIC GUIDED RIGHT BREAST CORE NEEDLE BIOPSY COMPARISON:  Previous exam(s). PROCEDURE: I met with the patient and we discussed the procedure of stereotactic guided and ultrasound-guided biopsies, including benefits  and alternatives. We discussed the high likelihood of a successful procedure. We discussed the risks of the procedure, including infection, bleeding, tissue injury, clip migration, and inadequate sampling. Informed written consent was given. The usual time-out protocol was performed immediately prior to the procedure. SITE 1: 1.9 CM RIGHT BREAST MASS (10 O'CLOCK 6 CMFN) Lesion quadrant: Upper outer quadrant Using sterile technique and 1% Lidocaine  as local anesthetic, under direct ultrasound visualization, a 14 gauge spring-loaded device was used to perform biopsy of 1.9 cm RIGHT breast mass using a lateral approach. At the conclusion of the procedure heart shaped tissue marker clip was deployed into the biopsy cavity. Follow up 2 view mammogram was performed and dictated separately. SITE 2: 0.6 CM GROUP OF CENTRAL RIGHT BREAST CALCIFICATIONS Lesion quadrant: Upper inner quadrant Using sterile technique and 1% Lidocaine  as local anesthetic, under stereotactic guidance, a 9 gauge vacuum assisted device was used to perform core needle biopsy of calcifications in the upper inner quadrant of the RIGHT breast using a lateral approach. Specimen radiograph was performed showing calcifications. Specimens with calcifications are identified for pathology. At the conclusion of the procedure, X shaped tissue marker clip was deployed into the biopsy cavity.  Follow-up 2-view mammogram was performed and dictated separately. IMPRESSION: Ultrasound guided biopsy of 1.9 cm RIGHT breast mass (10 o'clock 6 CMFN) and stereotactic guided biopsy of 0.6 cm group of central RIGHT breast calcifications. No apparent complications. Electronically Signed: By: Aliene Lloyd M.D. On: 07/26/2024 09:05   MM RT BREAST BX W LOC DEV 1ST LESION IMAGE BX SPEC STEREO GUIDE Addendum Date: 07/27/2024 ADDENDUM REPORT: 07/27/2024 12:59 ADDENDUM: PATHOLOGY revealed: Site 1. Breast, right, needle core biopsy, 10:00 6 cmfn (clip heart) - INVASIVE MODERATELY DIFFERENTIATED DUCTAL ADENOCARCINOMA, GRADE 2 (3+2+1) - OVERALL GRADE: GRADE 2 (6/9)- NEGATIVE FOR ANGIOLYMPHATIC INVASION TUMOR MEASURES 13 MM IN GREATEST LINEAR EXTENT ADJACENT FIBROCYSTIC CHANGES INCLUDING STROMAL FIBROSIS AND ADENOSIS MICROCALCIFICATIONS PRESENT WITHIN BENIGN GNOSIS. Pathology results are CONCORDANT with imaging findings, per Dr. Aliene Mir. PATHOLOGY revealed: Site 2. Breast, right, needle core biopsy, calcifications, central, (clip x)- FOCAL DUCTAL CARCINOMA IN SITU, INTERMEDIATE NUCLEAR GRADE, CRIBRIFORM AND SOLID TYPES WITHOUT NECROSIS NEGATIVE FOR INVASIVE CARCINOMA DCIS MEASURES 4 MM IN GREATEST LINEAR EXTENT EXTENSIVE STROMAL FIBROSIS WITH FOCAL USUAL DUCT HYPERPLASIA MICROCALCIFICATIONS PRESENT WITHIN DCIS AND USUAL DUCT HYPERPLASIA. Pathology results are CONCORDANT with imaging findings, per Dr. Aliene Mir. Pathology results and recommendations below were discussed with patient by telephone on 07/27/2024 by Mliss Molt RN. Patient reported biopsy site within normal limits with slight tenderness at the site. Post biopsy care instructions were reviewed, questions were answered and my direct phone number was provided to patient. Patient was instructed to call [facility] if any concerns or questions arise related to the biopsy. RECOMMENDATION: 1. Surgical and oncological consultation. The  patient was [facility] to [facility] on 08/02/24. 2. Recommend pretreatment bilateral breast MRI with and without contrast to determine extent of breast disease given diagnosis. Pathology results reported by Mliss Molt RN 07/27/2024. Electronically Signed   By: Aliene Lloyd M.D.   On: 07/27/2024 12:59   Result Date: 07/27/2024 CLINICAL DATA:  73 year old woman with highly suspicious RIGHT breast mass and suspicious calcifications of the RIGHT breast presents for ultrasound and stereotactic guided core needle biopsy. EXAM: ULTRASOUND GUIDED RIGHT BREAST CORE NEEDLE BIOPSY STEREOTACTIC GUIDED RIGHT BREAST CORE NEEDLE BIOPSY COMPARISON:  Previous exam(s). PROCEDURE: I met with the patient  and we discussed the procedure of stereotactic guided and ultrasound-guided biopsies, including benefits and alternatives. We discussed the high likelihood of a successful procedure. We discussed the risks of the procedure, including infection, bleeding, tissue injury, clip migration, and inadequate sampling. Informed written consent was given. The usual time-out protocol was performed immediately prior to the procedure. SITE 1: 1.9 CM RIGHT BREAST MASS (10 O'CLOCK 6 CMFN) Lesion quadrant: Upper outer quadrant Using sterile technique and 1% Lidocaine  as local anesthetic, under direct ultrasound visualization, a 14 gauge spring-loaded device was used to perform biopsy of 1.9 cm RIGHT breast mass using a lateral approach. At the conclusion of the procedure heart shaped tissue marker clip was deployed into the biopsy cavity. Follow up 2 view mammogram was performed and dictated separately. SITE 2: 0.6 CM GROUP OF CENTRAL RIGHT BREAST CALCIFICATIONS Lesion quadrant: Upper inner quadrant Using sterile technique and 1% Lidocaine  as local anesthetic, under stereotactic guidance, a 9 gauge vacuum assisted device was used to perform core needle biopsy of  calcifications in the upper inner quadrant of the RIGHT breast using a lateral approach. Specimen radiograph was performed showing calcifications. Specimens with calcifications are identified for pathology. At the conclusion of the procedure, X shaped tissue marker clip was deployed into the biopsy cavity. Follow-up 2-view mammogram was performed and dictated separately. IMPRESSION: Ultrasound guided biopsy of 1.9 cm RIGHT breast mass (10 o'clock 6 CMFN) and stereotactic guided biopsy of 0.6 cm group of central RIGHT breast calcifications. No apparent complications. Electronically Signed: By: Aliene Lloyd M.D. On: 07/26/2024 09:05   MM CLIP PLACEMENT RIGHT Result Date: 07/26/2024 CLINICAL DATA:  Status post ultrasound guided biopsy of RIGHT breast mass and stereotactic guided biopsy of RIGHT breast calcifications. EXAM: 3D DIAGNOSTIC RIGHT MAMMOGRAM POST ULTRASOUND AND STEREOTACTIC GUIDED BIOPSIES COMPARISON:  Previous exam(s). ACR Breast Density Category d: The breasts are extremely dense, which lowers the sensitivity of mammography. FINDINGS: 3D Mammographic images were obtained following ultrasound and stereotactic guided guided biopsies of the RIGHT breast. The biopsy marking clips are in expected position at the sites of biopsy. IMPRESSION: Appropriate positioning of the heart and X shaped shaped biopsy marking clip at the site of biopsy in the upper-outer quadrant of the RIGHT breast and central RIGHT breast respectively. Final Assessment: Post Procedure Mammograms for Marker Placement Electronically Signed   By: Aliene Lloyd M.D.   On: 07/26/2024 10:54   MM 3D DIAGNOSTIC MAMMOGRAM UNILATERAL RIGHT BREAST Result Date: 07/19/2024 CLINICAL DATA:  Recall from screening for a possible right breast architectural distortion and right breast calcifications. EXAM: DIGITAL DIAGNOSTIC UNILATERAL RIGHT MAMMOGRAM WITH TOMOSYNTHESIS AND CAD; ULTRASOUND RIGHT BREAST LIMITED TECHNIQUE: Right digital diagnostic  mammography and breast tomosynthesis was performed. The images were evaluated with computer-aided detection. ; Targeted ultrasound examination of the right breast was performed COMPARISON:  Previous exam(s). ACR Breast Density Category d: The breasts are extremely dense, which lowers the sensitivity of mammography. FINDINGS: On spot compression imaging, the possible architectural distortion noted in the posterior, upper outer right breast persists, as does an underlying irregular mass with associated calcifications. The mass measures approximately 1.7 cm in greatest dimension. On magnification imaging the calcifications noted on the screening exam are seen to better advantage. There is a 1 mm group of small amorphous calcifications in the medial breast. The remainder of the calcifications are in the lateral and upper outer breast extending from the distortion and mass towards the nipple. Calcifications are predominantly small amorphous, several areas with linear forms. Overall extent  of these calcifications measures a proximally 5 cm from anterior to posterior. There additional rod-like calcifications in the more anterolateral breast that are consistent with benign secretory calcifications. On physical exam, the entire upper outer quadrant the right breast is prominent and firm. No discrete palpable mass. Targeted ultrasound is performed, showing an irregular hypoechoic mass in the right breast at 10 o'clock, 6 cm the nipple, posterior depth, measuring 1.9 x 1.5 x 1.6 cm. This corresponds in size and shape to the mammographic mass and associated mammographic architectural distortion. There is prominent fibroglandular tissue throughout the lateral and upper outer right breast, but no additional defined sonographic masses. Sonographic imaging of the right axilla demonstrates normal lymph nodes. No enlarged or abnormal lymph nodes. IMPRESSION: 1. Highly suspicious 1.9 cm irregular mass in the right breast at 10  o'clock, 6 cm the nipple, with associated mammographic architectural distortion. 2. Suspicious calcifications in the lateral and upper outer right breast spanning at least 5 cm from anterior to posterior. 3. Likely benign 1 mm group of calcifications in the medial right breast, only visualized discretely on the cc view. RECOMMENDATION: 1. Ultrasound-guided core needle biopsy of the 1.9 cm mass in the right breast at 10 o'clock. 2. Stereotactic core needle biopsy of the calcifications in the upper outer right breast. Recommend targeting the most suspicious linear area of calcifications. 3. If biopsy results demonstrate malignancy, as expected, in staging breast MRI would be recommended given the overall extent of the calcifications and the patient's extremely dense breasts on mammography. I have discussed the findings and recommendations with the patient. If applicable, a reminder letter will be sent to the patient regarding the next appointment. BI-RADS CATEGORY  5: Highly suggestive of malignancy. Electronically Signed   By: Alm Parkins M.D.   On: 07/19/2024 12:24   US  LIMITED ULTRASOUND INCLUDING AXILLA RIGHT BREAST Result Date: 07/19/2024 CLINICAL DATA:  Recall from screening for a possible right breast architectural distortion and right breast calcifications. EXAM: DIGITAL DIAGNOSTIC UNILATERAL RIGHT MAMMOGRAM WITH TOMOSYNTHESIS AND CAD; ULTRASOUND RIGHT BREAST LIMITED TECHNIQUE: Right digital diagnostic mammography and breast tomosynthesis was performed. The images were evaluated with computer-aided detection. ; Targeted ultrasound examination of the right breast was performed COMPARISON:  Previous exam(s). ACR Breast Density Category d: The breasts are extremely dense, which lowers the sensitivity of mammography. FINDINGS: On spot compression imaging, the possible architectural distortion noted in the posterior, upper outer right breast persists, as does an underlying irregular mass with associated  calcifications. The mass measures approximately 1.7 cm in greatest dimension. On magnification imaging the calcifications noted on the screening exam are seen to better advantage. There is a 1 mm group of small amorphous calcifications in the medial breast. The remainder of the calcifications are in the lateral and upper outer breast extending from the distortion and mass towards the nipple. Calcifications are predominantly small amorphous, several areas with linear forms. Overall extent of these calcifications measures a proximally 5 cm from anterior to posterior. There additional rod-like calcifications in the more anterolateral breast that are consistent with benign secretory calcifications. On physical exam, the entire upper outer quadrant the right breast is prominent and firm. No discrete palpable mass. Targeted ultrasound is performed, showing an irregular hypoechoic mass in the right breast at 10 o'clock, 6 cm the nipple, posterior depth, measuring 1.9 x 1.5 x 1.6 cm. This corresponds in size and shape to the mammographic mass and associated mammographic architectural distortion. There is prominent fibroglandular tissue throughout the lateral and upper  outer right breast, but no additional defined sonographic masses. Sonographic imaging of the right axilla demonstrates normal lymph nodes. No enlarged or abnormal lymph nodes. IMPRESSION: 1. Highly suspicious 1.9 cm irregular mass in the right breast at 10 o'clock, 6 cm the nipple, with associated mammographic architectural distortion. 2. Suspicious calcifications in the lateral and upper outer right breast spanning at least 5 cm from anterior to posterior. 3. Likely benign 1 mm group of calcifications in the medial right breast, only visualized discretely on the cc view. RECOMMENDATION: 1. Ultrasound-guided core needle biopsy of the 1.9 cm mass in the right breast at 10 o'clock. 2. Stereotactic core needle biopsy of the calcifications in the upper outer right  breast. Recommend targeting the most suspicious linear area of calcifications. 3. If biopsy results demonstrate malignancy, as expected, in staging breast MRI would be recommended given the overall extent of the calcifications and the patient's extremely dense breasts on mammography. I have discussed the findings and recommendations with the patient. If applicable, a reminder letter will be sent to the patient regarding the next appointment. BI-RADS CATEGORY  5: Highly suggestive of malignancy. Electronically Signed   By: Alm Parkins M.D.   On: 07/19/2024 12:24   MM 3D SCREENING MAMMOGRAM BILATERAL BREAST Result Date: 07/11/2024 CLINICAL DATA:  Screening. EXAM: DIGITAL SCREENING BILATERAL MAMMOGRAM WITH TOMOSYNTHESIS AND CAD TECHNIQUE: Bilateral screening digital craniocaudal and mediolateral oblique mammograms were obtained. Bilateral screening digital breast tomosynthesis was performed. The images were evaluated with computer-aided detection. COMPARISON:  Previous exam(s). ACR Breast Density Category d: The breasts are extremely dense, which lowers the sensitivity of mammography. FINDINGS: In the right breast, a mass with possible distortion warrants further evaluation. LATERAL RIGHT breast calcifications also warrant further characterization. Possible MEDIAL calcifications seen the CC view also additional evaluation. In the left breast, no findings suspicious for malignancy. IMPRESSION: Further evaluation is suggested for possible mass with distortion, regional calcifications in the LATERAL breast, and possible grouped calcifications medially in the right breast. RECOMMENDATION: Diagnostic mammogram and possibly ultrasound of the right breast. (Code:[id number]) The patient will be contacted regarding the findings, and additional imaging will be scheduled. BI-RADS CATEGORY  0: Incomplete: Need additional imaging evaluation. Electronically Signed   By: Norleen Croak M.D.   On: 07/11/2024 11:22    All questions  were answered. The patient knows to call the clinic with any problems, questions or concerns. I spent 45 minutes in the care of this patient including H and P, review of records, counseling and coordination of care.     Amber Stalls, MD 08/02/2024 1:48 PM

## 2024-08-07 ENCOUNTER — Ambulatory Visit: Admitting: Family Medicine

## 2024-08-09 DIAGNOSIS — M79605 Pain in left leg: Secondary | ICD-10-CM | POA: Diagnosis not present

## 2024-08-09 DIAGNOSIS — Z780 Asymptomatic menopausal state: Secondary | ICD-10-CM | POA: Diagnosis not present

## 2024-08-10 ENCOUNTER — Telehealth: Payer: Self-pay | Admitting: *Deleted

## 2024-08-10 ENCOUNTER — Encounter: Payer: Self-pay | Admitting: *Deleted

## 2024-08-10 ENCOUNTER — Encounter: Payer: Self-pay | Admitting: General Surgery

## 2024-08-10 NOTE — Telephone Encounter (Signed)
 Called patient for [facility] f/u. Reviewed with her that MRI breast is next Tuesday and she is aware. Discussed her CT spine/lumber that was ordered by her PCP and done at [facility]) on 10/14. This navigator has called multiple times for report of this scan and informed that they are being read about 7-14 days after scan. Patient states this was ordered as since August has been having burning / pins and needles from her foot to her hip on her left side. She had another f/u with PCP yesterday (Dr. Elveria Kaiser with [facility]) and received some steroids. Patient aware that [facility] will call and schedule a surgery date and is aware of navigator's information if any other needs/concerns arise. She appreciated the phone call.

## 2024-08-14 ENCOUNTER — Ambulatory Visit: Payer: Self-pay

## 2024-08-14 DIAGNOSIS — Z801 Family history of malignant neoplasm of trachea, bronchus and lung: Secondary | ICD-10-CM

## 2024-08-14 DIAGNOSIS — C50411 Malignant neoplasm of upper-outer quadrant of right female breast: Secondary | ICD-10-CM

## 2024-08-14 DIAGNOSIS — Z809 Family history of malignant neoplasm, unspecified: Secondary | ICD-10-CM

## 2024-08-14 DIAGNOSIS — Z1379 Encounter for other screening for genetic and chromosomal anomalies: Secondary | ICD-10-CM | POA: Insufficient documentation

## 2024-08-14 DIAGNOSIS — C569 Malignant neoplasm of unspecified ovary: Secondary | ICD-10-CM

## 2024-08-14 NOTE — Progress Notes (Unsigned)
 HPI:  Emily Hayes was previously seen in the [facility] due to a personal history of ovarian and breast cancer and concerns regarding a hereditary predisposition to cancer. Please refer to our prior cancer genetics clinic note for more information regarding our discussion, assessment and recommendations, at the time. Emily Hayes recent genetic test results were disclosed to her, as were recommendations warranted by these results. These results and recommendations are discussed in more detail below.  CANCER HISTORY:  Oncology History  Malignant neoplasm of upper-outer quadrant of right breast in female, estrogen receptor positive (HCC)  07/07/2024 Mammogram   Highly suspicious 1.9 cm irregular mass in the right breast at 10 o'clock, 6 cm the nipple, with associated mammographic architectural distortion. 2. Suspicious calcifications in the lateral and upper outer right breast spanning at least 5 cm from anterior to posterior. 3. Likely benign 1 mm group of calcifications in the medial right breast, only visualized discretely on the cc view.   07/26/2024 Pathology Results   1. Breast, right, needle core biopsy, 10:00 6 cmfn (clip heart) :      INVASIVE MODERATELY DIFFERENTIATED DUCTAL ADENOCARCINOMA, GRADE 2 (3+2+1)      TUBULE FORMATION: SCORE 3      NUCLEAR PLEOMORPHISM: SCORE 2      MITOTIC COUNT: SCORE 1      TOTAL SCORE: 6      OVERALL GRADE: GRADE 2 (6/9)      NEGATIVE FOR ANGIOLYMPHATIC INVASION      TUMOR MEASURES 13 MM IN GREATEST LINEAR EXTENT      ADJACENT FIBROCYSTIC CHANGES INCLUDING STROMAL FIBROSIS AND ADENOSIS      MICROCALCIFICATIONS PRESENT WITHIN BENIGN GNOSIS       2. Breast, right, needle core biopsy, calcifications, central, (clip x) :      FOCAL DUCTAL CARCINOMA IN SITU, INTERMEDIATE NUCLEAR GRADE, CRIBRIFORM AND SOLID      TYPES WITHOUT NECROSIS      NEGATIVE FOR INVASIVE CARCINOMA      DCIS MEASURES 4 MM IN GREATEST LINEAR EXTENT      EXTENSIVE STROMAL  FIBROSIS WITH FOCAL USUAL DUCT HYPERPLASIA      MICROCALCIFICATIONS PRESENT WITHIN DCIS AND USUAL DUCT HYPERPLASIA    07/28/2024 Initial Diagnosis   Malignant neoplasm of upper-outer quadrant of right breast in female, estrogen receptor positive (HCC)   08/02/2024 Cancer Staging   Staging form: Breast, AJCC 8th Edition - Clinical: Stage IA (cT1c, cN0, cM0, G2, ER+, PR-, HER2-) - Signed by Loretha Ash, MD on 08/02/2024 Histologic grading system: 3 grade system    Genetic Testing   Negative [facility] BRCAPlus + RNA (13 gene) panel. Report date 08/11/2024. 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 There is currently no evidence to suggest a significantly increased cancer risk for carriers of MUTYH pathogenic variants. Report date 08/14/2024.     FAMILY HISTORY:  We obtained a detailed, 4-generation family history.  Significant diagnoses are listed below: Family History  Problem Relation Age of Onset   Stroke Mother    Diabetes Mother    Dementia Mother    Alzheimer's disease Mother    Cancer Sister        unknown   Cancer Sister        unknown   Cancer Sister        unknown   Cancer Sister        brain tumor   Colon polyps Brother    Hypertension Brother  Hyperlipidemia Brother    Kidney failure Brother    Lung cancer Brother    Colon cancer Neg Hx    Esophageal cancer Neg Hx    Rectal cancer Neg Hx    Stomach cancer Neg Hx      Emily Hayes was diagnosed with ovarian cancer when she was 17. She is now diagnosed with breast cancer. She has multiple sisters who have died of unknown cancers. Her sister, Emily Hayes, had brain tumors diagnosed in her 35's. Her brother Emily Hayes had lung cancer in his 45's   Emily Hayes is unaware of previous family history of genetic testing for hereditary cancer risks. There is no reported Ashkenazi Jewish ancestry. There is no known consanguinity.  GENETIC TEST RESULTS: Genetic testing was  reported out on 08/11/24 and 08/14/2024 through the [facility] BRCAPlus + RNA cancer panel and [facility] CancerNext + RNA cancer panel, respectively.  The [facility] BRCAPlus (13 gene) panel found no pathogenic mutations. The [facility] BRCAPlus gene panel includes sequencing and rearrangement analysis for the following 13 genes: ATM, BARD1, BRCA1, BRCA2, CDH1, CHEK2, NF1, PALB2, PTEN, RAD51C, RAD51D, STK11 and TP53.The test report has been scanned into EPIC and is located under the Molecular Pathology section of the Results Review tab.  A portion of the result report is included below for reference.     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 Biallelic variants in this gene are associated with an autosomal recessive condition called MUTYH associated polyposis (MAP). There is no evidence to suggest a significantly increased cancer risk for monoallelic MUTYH pathogenic variant carriers. The [facility] CancerNext + RNAinsight gene panel includes sequencing, rearrangement analysis, and RNA analysis for the following 40 genes: APC, ATM, BAP1, BARD1, BMPR1A, BRCA1, BRCA2, BRIP1, CDH1, CDKN2A, CHEK2, FH, FLCN, MET, MLH1, MSH2, MSH6, MUTYH, NF1, NTHL1, PALB2, PMS2, PTEN, RAD51C, RAD51D, RPS20, SMAD4, STK11, TP53, TSC1, TSC2, and VHL (sequencing and deletion/duplication); AXIN2, HOXB13, MBD4, MSH3, POLD1 and POLE (sequencing only); EPCAM and GREM1 (deletion/duplication only). The test report has been scanned into EPIC and is located under the Molecular Pathology section of the Results Review tab.  A portion of the result report is included below for reference.     Breast Cancer Implications: Of note, this result does not explain the personal history of breast cancer.  Even though a pathogenic variant related to breast cancer was not identified, possible explanations for the cancer in the family may  include:  The cancers in Emily Hayes and/or her family may not be hereditary but rather sporadic/familial or due to other genetic and environmental factors.  There may be a gene mutation in one of these genes that current testing methods cannot detect but that chance is small.  There could be another gene that has not yet been discovered, or that we have not yet tested, that is responsible for the cancer diagnoses in the family.   It is also possible there is a hereditary cause for the cancer in the family that Emily Hayes did not inherit. Therefore, it is important to remain in touch with cancer genetics in the future so that we can continue to offer Emily Hayes the most up to date genetic testing.   MUTYH Variant Carriers:  MUTYH- Associated Polyposis (MAP) is an autosomal recessive condition that increases the risk to develop multiple colon/duodenal polyps and increases cancer risk.  We reviewed recessive  inheritance and discussed when an individual has two MUTYH pathogenic mutations, this is associated with the condition MAP and therefore an increased risk for adenomatous (precancerous) colon polyps.      Based on current data from NCCN, the risk for colon cancer/polyps in individuals who are MUTYH heterozygotes (only 1 mutation) is not increased.  Only One MUTYH pathogenic variant was detected in Emily. Roszak and to our knowledge does not have MUTYH associated polyposis (MAP). Approximately 1-2% of the general population are monoallelic MUTYH carriers. While this is reassuring, there is always a small chance that genetic testing did not detect all possible variants, as the technology is constantly evolving.    No other pathogenic variants were identified in any of the other genes analyzed on this panel. This result indicates that it is unlikely Emily. Tamm's breast cancer was due to a hereditary cancer predisposition syndrome.  Most cancers happen by chance and this negative test suggests that  her cancer may fall into this category.  It is recommended she continue to follow the cancer management and screening guidelines provided by his oncology and primary healthcare providers. Other factors such as her personal and family history may still affect his cancer risk.  We discussed with Emily Hayes that because current genetic testing is not perfect, it is possible there may be a gene mutation in one of these genes that current testing cannot detect, but that chance is small.  We also discussed, that there could be another gene that has not yet been discovered, or that we have not yet tested, that is responsible for the cancer diagnoses in the family. It is also possible there is a hereditary cause for the cancer in the family that Emily Hayes did not inherit and therefore was not identified in her testing.  Therefore, it is important to remain in touch with cancer genetics in the future so that we can continue to offer Emily Hayes the most up to date genetic testing.   SCREENING RECOMMENDATIONS:  We recommended Emily Hayes follow management guidelines for breast cancer based on her family history and what her oncologists and primary care provider recommends. We recommended Emily Hayes follow heterozygous MUTYH mutations; all of which are outlined below. These are from the most recent NCCN guidelines, and are subject to change.    MUTYH heterozygote with no personal history of colon cancer and no family history of colon cancer: As Emily Hayes did not report family history of a first degree relative with colorectal cancer, general population screening with colonoscopies beginning at age 39 and repeated every 5-10 years or as recommended by their GI provider.   It is important for Emily Hayes to follow general population colon screening guidelines, and any recommendations provided to her by her GI or other healthcare providers.     RECOMMENDATIONS FOR FAMILY MEMBERS:   Since we now know the  mutation in Emily Hayes, we can test relatives to determine their MUTYH status. We will be happy to meet with any of the family members or refer them to a genetic counselor in their local area. To locate genetic counselors in other cities, individuals can visit the website of the [employer] ([email or web address].nl) and financial controller for a veterinary surgeon by zip code.   Identification of a MUTYH mutation in a family is still valuable, as an individual may inherit a MUTYH mutation in both copies of their MUTYH gene (called a biallelic mutations), which is associated with a condition called MUTYH-associated polyposis (  MAP) and  is associated with an increased risk for colon polyps and colorectal cancer. People with MAP may also have increased risks for other cancers, such as cancer of the small intestine.   For MUTYH carriers, their first degree relatives (parents, children, and siblings) have a 50% chance to have the same mutation. Family members should consider having genetic testing, typically recommended after the age of 69. If two parents carry a single MUTYH mutation, they would have a 25% chance to have a child with two MUTYH mutations, consistent MUTYH-associated polyposis syndrome. Some people may choose to have their partner tested for MUTYH mutations to further clarify the chance to have a child with MAP syndrome.   Relatives in this family might be at some increased risk of developing cancer, over the general population risk, simply due to the family history of cancer.  We recommended women in this family have a yearly mammogram beginning at age 59, or 62 years younger than the earliest onset of cancer, an annual clinical breast exam, and perform monthly breast self-exams. Women in this family should also have a gynecological exam as recommended by their primary provider. All family members should have a colonoscopy by age 34 (or as directed by their physcians). All family members should  inform their physicians about the family history of cancer so their doctors can make the most appropriate screening recommendations for them.   ADDITIONAL GENETIC TESTING: We discussed with Emily. Loughmiller that her genetic testing was fairly extensive.  If there are genes identified to increase cancer risk that can be analyzed in the future, we would be happy to discuss and coordinate this testing at that time.    An individual's cancer risk and medical management are not determined by genetic test results alone. Overall cancer risk assessment incorporates additional factors, including personal medical history, family history, and any available genetic information that may result in a personalized plan for cancer prevention and surveillance.  FOLLOW-UP: Lastly, we discussed with Emily. Nicolaisen that cancer genetics is a rapidly advancing field and it is possible that new genetic tests will be appropriate for her and/or her family members in the future. We encouraged her to remain in contact with cancer genetics on an annual basis so we can update her personal and family histories and let her know of advances in cancer genetics that may benefit this family.   Our contact number was provided. Emily. Aranas questions were answered to her satisfaction, and she knows she is welcome to call us  at anytime with additional questions or concerns.   Warren Ahle, Emily, [facility] Cancer Genetic Counselor [email or web address] [phone]

## 2024-08-15 ENCOUNTER — Telehealth: Payer: Self-pay

## 2024-08-15 ENCOUNTER — Encounter: Payer: Self-pay | Admitting: *Deleted

## 2024-08-15 ENCOUNTER — Other Ambulatory Visit

## 2024-08-15 NOTE — Progress Notes (Signed)
 See prior note today regarding rescheduling of breast MRI and reading of CT spine. MRI has been rescheduled for Sunday Nov 2. Spoke to Pollock in Radiology at [facility] and she will have CT spine results read today and faxed to me by end of day.  I called patient with update of above and she was very appreciative. Aware to call with any other needs/concerns.

## 2024-08-15 NOTE — Telephone Encounter (Signed)
 I contacted Emily Hayes to discuss her genetic testing results. The test that was ordered was the [facility] Panel on 08/02/2024. Ms. Allmon was found to have one pathogenic mutation in a gene called MUTYH. Having two pathogenic mutations in this gene causes a condition called MAP (MUTYH Associated Polyposis) which increases the risk for colon cancer and polyps, and some other cancers. Ms. Gillette does not have MAP since she has one pathogenic mutation, not two, in the MUTYH gene. Changes in medical management is not recommended for her based on this result. We did recommend genetic testing for family members, particularly her siblings and children. Detailed clinic note to follow.   The test report has been scanned into EPIC and is located under the Molecular Pathology section of the Results Review tab.  A portion of the result report is included below for reference.      Warren Ahle, MS, [facility] Cancer Genetic Counselor [email or web address] [phone]

## 2024-08-15 NOTE — Progress Notes (Signed)
 Patient called navigator stating she was scheduled for breast MRI today at the [facility] location and she was called and told the machine was broken and she would be rescheduled. Patient stated they didn't have an appt for 2-3 weeks out. She came to [facility] on 10/15. Treatment plan to be determined after MRI. Email sent to schedulers at [facility] and requested if they can get her rescheduled asap.  Patient also stated she is still having a lot of nerve pain from hip down to feet and is awaiting results of imaging done with [facility] on 10/14. (See prior navigation note as have been waiting for results for two weeks). Spoke with Ellouise in radiology [phone]) and requested this please be read asap. Ellouise was forwarding it to radiologists today.

## 2024-08-16 ENCOUNTER — Other Ambulatory Visit: Payer: Self-pay | Admitting: *Deleted

## 2024-08-16 ENCOUNTER — Encounter: Payer: Self-pay | Admitting: *Deleted

## 2024-08-16 DIAGNOSIS — M5416 Radiculopathy, lumbar region: Secondary | ICD-10-CM | POA: Diagnosis not present

## 2024-08-16 DIAGNOSIS — Z780 Asymptomatic menopausal state: Secondary | ICD-10-CM | POA: Diagnosis not present

## 2024-08-16 NOTE — Progress Notes (Signed)
 [address] - AMBULATORY [facility] [address] [place] [address]   Date of Service: 08/16/2024 Patient DOB: 06-21-1951    History of Present Illness   Patient ID: Emily Hayes is a 73 y.o. female. Patient with PMH of ovarian and breast CA, HTN, HLD, tobacco use, sciatica, chronic low back pain,  presents to urgent care due to low back pain for approximately 3 to 4 months.  Aggravated with laying flat or standing up straight.  Associated numbness, tingling, burning sensation down left lateral thigh extending into lateral aspect of foot.  Patient states symptoms have been ongoing for the past few months in which she has tried multiple medications including gabapentin , muscle relaxers, steroids without significant relief.  Reports recent CT lumbar spine.  States that she has not seen a specialist for this problem.  Denies any recent injury or trauma.  Denies weakness, limping, fever, bowel or bladder incontinence, saddle anesthesia, abdominal pain.  Also notes over the past few days she has also started to experience right sided neck discomfort to which she attributes trying to crack her neck.  States that pain is a tightening sensation.   Active Ambulatory Problems    Diagnosis Date Noted  . Peritonsillar abscess 09/15/2017   Resolved Ambulatory Problems    Diagnosis Date Noted  . No Resolved Ambulatory Problems   Past Medical History:  Diagnosis Date  . Arthritis   . Headache   . High cholesterol   . Hypertension     BP 130/76   Pulse 109   Ht 1.676 m (5' 6)   Wt 56.7 kg (125 lb)   BMI 20.18 kg/m    Review of Systems   Review of Systems  Musculoskeletal:  Positive for back pain and neck stiffness.  Neurological:  Positive for numbness.     Physicial Exam   Physical Exam Vitals and nursing note reviewed.  Constitutional:      Appearance: Normal appearance. She is  normal weight.  HENT:     Head: Normocephalic and atraumatic.     Right Ear: External ear normal.     Left Ear: External ear normal.     Nose: Nose normal.  Eyes:     Extraocular Movements: Extraocular movements intact.     Conjunctiva/sclera: Conjunctivae normal.  Musculoskeletal:     Cervical back: Normal range of motion and neck supple. Muscular tenderness present. No pain with movement or spinous process tenderness. Normal range of motion.     Thoracic back: No tenderness or bony tenderness. Normal range of motion.     Lumbar back: Tenderness present. No bony tenderness. Decreased range of motion. Negative right straight leg raise test and negative left straight leg raise test.     Comments: AROM, strength, sensation of bilateral LE intact. Strong bilateral AT pulses. Negative bilateral FABER test.  Skin:    General: Skin is warm and dry.  Neurological:     General: No focal deficit present.     Mental Status: She is alert and oriented to person, place, and time.  Psychiatric:        Mood and Affect: Mood normal.     Diagnosis   Emily Hayes was seen today for sciatica.  Diagnoses and all orders for this visit:  Lumbar radiculopathy -     Ambulatory Referral to Orthopedics; Future     Medical Decision Making Emily Hayes is a 73 y.o. female who presents  to urgent care today with complaints of  Chief Complaint  Patient presents with  . Sciatica    Pt having pain on left side and radiates down her left leg with tingling, numbness and burning. She has had a CT scan recently and xrays in Aug 2025. Neck pain as well with some muscle spasms    On exam, VS stable and patient is afebrile. Appears well-hydrated and capillary refill <2 seconds.   DDX: MSK, fracture, spinal stenosis, disc herniation, cauda equina, kidney stone, pyelonephritis, AAA, tumor, osteomyelitis, epidural abscess, discitis  Patient presents urgent care due to chronic low back pain. Patient does have a  significant PMH of breast and ovarian cancer.   Pt does not exhibit any red flag sx including significant trauma, bilateral neurologic deficit, new urinary retention, fecal incontinence, saddle anesthesia, IVDU, immunocompromised, recent procedure, inability to walk.   No objective findings concerning for cauda equina or acute spinal cord compression. No mechanism of injury and absence of midline spinal tenderness make fracture/malalignment unlikely. Nothing concerning for referred cardiovascular (i.e. AAA or ruptured aortic aneurysm), urinary (i.e. nephrolithiasis or pyelonephritis), or abdominal etiology of back pain.  Upon chart review, CT lumbar spine completed approximately 15 days ago significant for multilevel degenerative changes of the lumbar spine.  Patient has tried multiple prescription medications and therapies without significant relief.  Will refer to ortho provided at this time.    Discharge Information  Symptomatic management discussed.  Patient was given verbal and written instructions on symptoms that necessitate return to the UC/ED, and instructed to f/u w/ UC or PCP if not improving in expected timeframe.   Patient/parent has been instructed on RX/OTC medications, dosages, side effects, and possible interactions as associated with each diagnosis in my impression and plan above.   Patient education (verbal/handout) given on diagnosis, pathophysiology, treatment of diagnosis, side effects of medication use for treatment, restrictions while taking medication, supportives measures such as staying hydrated.   Red Flags associated with diagnosis/es were reviewed and patient instructed on action plan if red flags develop.   They have been instructed that if symptoms worsen or red flags develop they should return to Urgent Care, go to the nearest ED, or activate EMS/911.     Patient and/or parent/guardian (if applicable) agreed with plan and voiced understanding.  No barriers to  adherence perceived by myself.   Portions of this note may have been dictated using Dragon dictation software/hardware and may contain grammatical or spelling errors.    If a new prescription was given today, then I discussed potential side effects, drug interactions, instructions for taking the medication, and the consequences of not taking it.    F/u: Follow up closely with primary care provider (PCP) and other specialists for further care and routine care, but seek medical attention sooner if worsening/concerning signs or symptoms.  Routine Follow Up with Specialist   Electronically signed by: Darryle Slater Fish, PA-C 08/16/2024 8:48 AM

## 2024-08-16 NOTE — Progress Notes (Signed)
 Faxed CT spine lumber results received from [facility].  Dr. Aron, Dr. Loretha, and Dr. Izell made aware of results. Fax to be scanned to patient's chart.

## 2024-08-17 ENCOUNTER — Encounter: Payer: Self-pay | Admitting: *Deleted

## 2024-08-17 DIAGNOSIS — C50411 Malignant neoplasm of upper-outer quadrant of right female breast: Secondary | ICD-10-CM

## 2024-08-17 NOTE — Progress Notes (Signed)
 CT lumbar spine results shared with providers (see note yesterday). Patient did have an appt with her primary care provider yesterday about her CT spine results and they ordered an orthopedic consult.  Dr. Izell stated if patient wanted to get imaging per their recommendations that was fine or if wanted something sooner that she would order a lumbosacral MRI with and without contrast. This navigator called and left message on patient's voice mail to please call back to discuss above.

## 2024-08-17 NOTE — Progress Notes (Signed)
 Patient returned call and stated it was an urgent care she went to yesterday as the pain was so bad in her back/leg. They were who were going to refer her to ortho but she doesn't know any further information on this. She states she is still in immense amount of pain and would very much like to have the MRI lumbar spine that Dr. Izell is ordering. Orders being placed now and will ask schedulers to work her in as soon as possible.

## 2024-08-18 ENCOUNTER — Encounter: Payer: Self-pay | Admitting: Radiation Oncology

## 2024-08-18 ENCOUNTER — Encounter: Payer: Self-pay | Admitting: *Deleted

## 2024-08-18 DIAGNOSIS — Z170 Estrogen receptor positive status [ER+]: Secondary | ICD-10-CM

## 2024-08-18 NOTE — Progress Notes (Signed)
 Verified with Dr. Izell MRI lumbar and MRI sacrum with and without contrast is what she wants ordered. Entered in EPIC. Made [facility] schedulers aware.

## 2024-08-20 ENCOUNTER — Ambulatory Visit
Admission: RE | Admit: 2024-08-20 | Discharge: 2024-08-20 | Disposition: A | Discharge status: Home / Self Care | Source: Ambulatory Visit | Attending: General Surgery | Admitting: General Surgery

## 2024-08-20 DIAGNOSIS — C50411 Malignant neoplasm of upper-outer quadrant of right female breast: Secondary | ICD-10-CM

## 2024-08-20 MED ORDER — GADOPICLENOL 0.5 MMOL/ML IV SOLN
6.0000 mL | Freq: Once | INTRAVENOUS | Status: AC | PRN
  Administered 2024-08-20: 6 mL via INTRAVENOUS

## 2024-08-21 ENCOUNTER — Ambulatory Visit
Admission: RE | Admit: 2024-08-21 | Discharge: 2024-08-21 | Disposition: A | Discharge status: Home / Self Care | Source: Ambulatory Visit | Attending: Radiation Oncology | Admitting: Radiation Oncology

## 2024-08-21 ENCOUNTER — Encounter: Payer: Self-pay | Admitting: Radiology

## 2024-08-21 DIAGNOSIS — Z170 Estrogen receptor positive status [ER+]: Secondary | ICD-10-CM

## 2024-08-21 MED ORDER — GADOPICLENOL 0.5 MMOL/ML IV SOLN
5.5000 mL | Freq: Once | INTRAVENOUS | Status: AC | PRN
Start: 2024-08-21 — End: 2024-08-21
  Administered 2024-08-21: 5.5 mL via INTRAVENOUS

## 2024-08-22 ENCOUNTER — Telehealth: Payer: Self-pay | Admitting: *Deleted

## 2024-08-22 NOTE — Telephone Encounter (Signed)
 Called radiology reading room to request MRI lumbar be read.  Was informed it would be tomorrow.

## 2024-08-23 ENCOUNTER — Ambulatory Visit: Payer: Self-pay | Admitting: General Surgery

## 2024-08-23 NOTE — Telephone Encounter (Signed)
 Discussed MR with patient  Will order additional MR biopsies and move out appointment with me.

## 2024-08-25 ENCOUNTER — Other Ambulatory Visit: Payer: Self-pay | Admitting: General Surgery

## 2024-08-25 ENCOUNTER — Encounter: Payer: Self-pay | Admitting: Radiation Oncology

## 2024-08-25 ENCOUNTER — Other Ambulatory Visit: Payer: Self-pay | Admitting: Radiation Therapy

## 2024-08-25 ENCOUNTER — Encounter: Payer: Self-pay | Admitting: *Deleted

## 2024-08-25 ENCOUNTER — Telehealth: Payer: Self-pay | Admitting: *Deleted

## 2024-08-25 DIAGNOSIS — R928 Other abnormal and inconclusive findings on diagnostic imaging of breast: Secondary | ICD-10-CM

## 2024-08-25 NOTE — Telephone Encounter (Signed)
 Left message for a return phone call regarding results of recent MRI

## 2024-08-28 ENCOUNTER — Telehealth: Payer: Self-pay | Admitting: Radiation Oncology

## 2024-08-28 ENCOUNTER — Encounter: Payer: Self-pay | Admitting: Radiation Oncology

## 2024-08-28 ENCOUNTER — Telehealth: Payer: Self-pay | Admitting: Radiation Therapy

## 2024-08-28 ENCOUNTER — Other Ambulatory Visit: Payer: Self-pay | Admitting: Radiation Therapy

## 2024-08-28 ENCOUNTER — Inpatient Hospital Stay

## 2024-08-28 DIAGNOSIS — C7931 Secondary malignant neoplasm of brain: Secondary | ICD-10-CM

## 2024-08-28 NOTE — Progress Notes (Signed)
 We  just discussed Emily Hayes's L-S MRI at CNS conference-  Devere Perch is going to arrange MRI with and without contrast of Brain, C, and T spine to complete staging of CNS axis.  I've asked Dr. Loretha to order visceral staging (whether PET or CT of chest/Abd/ pelvis and bone scan)  Devere Perch (CNS navigator) will call the pt w/ updates and recommend she cancel her ortho appt if it has not happened yet.  Will hold off on biopsies until we get more imaging.  -----------------------------------  Lauraine Golden, MD

## 2024-08-28 NOTE — Progress Notes (Signed)
 After Emily Hayes called Ms. Odriscoll, she shared with me that the patient would very much like to speak with me. I called Ms. Schreier and her daughter thereafter and left VM's with my contact information in hopes that we can connect soon. -----------------------------------  Lauraine Golden, MD

## 2024-08-28 NOTE — Progress Notes (Signed)
 I reached the patient and her daughter to discuss her MRI spine results.  We discussed the possibility and concern for spinal canal metastases and the purpose of more imaging.  We discussed the possibility of a benign etiology as well. The patient's MRI brain and upper spine are scheduled for Wed 11/12.  Visceral/bone  imaging to be determined per med/onc instructions.    Pertinent notes from our conversation today: she actually has had the sciatic pain for a few years, but it's been way worse for a few months. Medrol  dose pack and gabapentin  did not help pain - aleve helps a bit. Neuropathy in foot/swelling in thigh also reported.   The patient reports that her ovarian cancer was cured in the 1980s.  Emotional support provided and questions answered to the best of my ability today.  We will continue to be in touch with the patient after her upcoming scans to keep her workup moving forward.  -----------------------------------  Lauraine Golden, MD

## 2024-08-28 NOTE — Telephone Encounter (Signed)
 Sent inbasket to [facility] team and navigator Devere regarding referral from Dr. Izell for pt to be seen 11/17.

## 2024-08-28 NOTE — Telephone Encounter (Addendum)
 I spoke with Emily Hayes about recommendations from conference this morning. Dr. Izell requests we complete her CNS staging by getting a brain, cervical spine and thoracic spine MRI. Dr. Izell has also sent a message to her medical oncologist recommending systemic imaging be completed.   The CNS imaging has been scheduled for Wed 11/12. Emily Hayes is aware and plans to attend this appointment.   During our telephone conversation, the patient was tearful and expressed frustration that I am the only person from the cancer center that has talked to her about this. The scan results were released over the weekend in MyChart. When she read them she was scared and not sure what this meant but could only think the worst.  I told her that the breast cancer navigator had called on Friday to share we would be reviewing her case in conference and then reaching back out today with the recommendations, but she did not get that message and was upset with the lack of communication she has received.  Emily Hayes said that the pain in her back and LLE is so severe that her clothes touching her causes pain.  She has had pain for over four months but now has pins and needles with intermittent numbness in her left foot.   I will share this with Dr. Izell and ask that she also reach out to the patient today.   Devere Perch R.T(R)(T) Radiation Special Procedures Lead

## 2024-08-29 ENCOUNTER — Other Ambulatory Visit: Payer: Self-pay | Admitting: *Deleted

## 2024-08-29 ENCOUNTER — Encounter: Payer: Self-pay | Admitting: *Deleted

## 2024-08-29 ENCOUNTER — Encounter (HOSPITAL_COMMUNITY)
Admission: RE | Admit: 2024-08-29 | Discharge: 2024-08-29 | Disposition: A | Discharge status: Home / Self Care | Source: Ambulatory Visit | Attending: Hematology and Oncology | Admitting: Hematology and Oncology

## 2024-08-29 DIAGNOSIS — C50411 Malignant neoplasm of upper-outer quadrant of right female breast: Secondary | ICD-10-CM

## 2024-08-29 DIAGNOSIS — Z170 Estrogen receptor positive status [ER+]: Secondary | ICD-10-CM | POA: Diagnosis present

## 2024-08-29 DIAGNOSIS — R937 Abnormal findings on diagnostic imaging of other parts of musculoskeletal system: Secondary | ICD-10-CM

## 2024-08-29 DIAGNOSIS — C569 Malignant neoplasm of unspecified ovary: Secondary | ICD-10-CM

## 2024-08-29 LAB — GLUCOSE, CAPILLARY: Glucose-Capillary: 142 mg/dL — ABNORMAL HIGH (ref 70–99)

## 2024-08-29 MED ORDER — FLUDEOXYGLUCOSE F - 18 (FDG) INJECTION
5.9800 | Freq: Once | INTRAVENOUS | Status: AC
  Administered 2024-08-29: 5.98 via INTRAVENOUS

## 2024-08-30 ENCOUNTER — Ambulatory Visit (HOSPITAL_COMMUNITY)
Admission: RE | Admit: 2024-08-30 | Discharge: 2024-08-30 | Disposition: A | Discharge status: Home / Self Care | Source: Ambulatory Visit | Attending: Radiation Oncology | Admitting: Radiation Oncology

## 2024-08-30 ENCOUNTER — Other Ambulatory Visit: Payer: Self-pay | Admitting: General Surgery

## 2024-08-30 DIAGNOSIS — R928 Other abnormal and inconclusive findings on diagnostic imaging of breast: Secondary | ICD-10-CM

## 2024-08-30 DIAGNOSIS — C7931 Secondary malignant neoplasm of brain: Secondary | ICD-10-CM | POA: Diagnosis present

## 2024-08-30 DIAGNOSIS — C7949 Secondary malignant neoplasm of other parts of nervous system: Secondary | ICD-10-CM | POA: Insufficient documentation

## 2024-08-30 MED ORDER — GADOBUTROL 1 MMOL/ML IV SOLN
5.5000 mL | Freq: Once | INTRAVENOUS | Status: AC | PRN
  Administered 2024-08-30: 5.5 mL via INTRAVENOUS

## 2024-08-31 NOTE — Telephone Encounter (Signed)
 Called Ms. Ramnath to discuss genetic testing results. Left VM and provided call back number

## 2024-09-01 ENCOUNTER — Other Ambulatory Visit

## 2024-09-01 ENCOUNTER — Inpatient Hospital Stay: Attending: Hematology and Oncology | Admitting: Hematology and Oncology

## 2024-09-01 ENCOUNTER — Other Ambulatory Visit: Payer: Self-pay | Admitting: Hematology and Oncology

## 2024-09-01 VITALS — BP 170/55 | HR 95 | Temp 98.3°F | Resp 18 | Ht 66.0 in | Wt 123.4 lb

## 2024-09-01 DIAGNOSIS — C7931 Secondary malignant neoplasm of brain: Secondary | ICD-10-CM | POA: Diagnosis not present

## 2024-09-01 DIAGNOSIS — R911 Solitary pulmonary nodule: Secondary | ICD-10-CM

## 2024-09-01 DIAGNOSIS — R918 Other nonspecific abnormal finding of lung field: Secondary | ICD-10-CM | POA: Insufficient documentation

## 2024-09-01 DIAGNOSIS — C50411 Malignant neoplasm of upper-outer quadrant of right female breast: Secondary | ICD-10-CM | POA: Insufficient documentation

## 2024-09-01 DIAGNOSIS — Z170 Estrogen receptor positive status [ER+]: Secondary | ICD-10-CM | POA: Diagnosis not present

## 2024-09-01 DIAGNOSIS — Z8543 Personal history of malignant neoplasm of ovary: Secondary | ICD-10-CM | POA: Insufficient documentation

## 2024-09-01 DIAGNOSIS — R590 Localized enlarged lymph nodes: Secondary | ICD-10-CM | POA: Insufficient documentation

## 2024-09-01 NOTE — Progress Notes (Signed)
 [facility] CONSULT NOTE  Patient Care Team: [name] LABOR, MD as PCP - General (Family Medicine) Joshua Rush, OD (Optometry) Gerome, Devere HERO, RN as Oncology Nurse Navigator Tyree, Nanetta SAILOR, RN as Oncology Nurse Navigator Aron Shoulders, MD as Consulting Physician (General Surgery) Loretha Ash, MD as Consulting Physician (Hematology and Oncology) Izell Domino, MD as Attending Physician (Radiation Oncology)  CHIEF COMPLAINTS/PURPOSE OF CONSULTATION:  New diagnosis of right sided breast cancer  ASSESSMENT & PLAN:   Assessment and Plan Assessment & Plan Invasive ductal carcinoma and ductal carcinoma in situ (DCIS) of right breast, estrogen receptor positive, progesterone receptor negative, intermediate grade When we last saw her, original plan was lumpectomy, oncotype testing, adj rad and antiestrogen therapy However she complained of excessive back pain, hence had additional imaging and she is here to discuss this.  Metastatic brain lesions  Multiple brain lesions on MRI,  No significant edema. Differential includes metastatic breast cancer or Second primary with brain mets Poor prognosis due to brain involvement and drop mets - FU with Dr Izell as scheduled to discuss about role of WBRT. - Ordered Guardant 360 for information about targetable mutation - Schedule biopsy of lung nodule or hilar lymph node to confirm primary being breast. It is just unusual for a small ER pos tumor with no axillary adenopathy to cause brain mets.  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 - Is there role for radiation?  Pulmonary nodules and hilar lymphadenopathy, suspicious for metastatic disease Two lung nodules and hilar lymphadenopathy on PET scan. Differential includes metastatic breast cancer or primary lung cancer. She is a long term smoker. -  Schedule biopsy of lung nodule or hilar lymph node. Will discuss with IR.  Tobacco use Long-term tobacco use, currently smoking half a pack per day. Smoking cessation important due to lung cancer risk. - Encouraged smoking cessation.  HISTORY OF PRESENTING ILLNESS:  Emily Hayes 73 y.o. female is here because of new diagnosis of breast cancer  Oncology History  Malignant neoplasm of upper-outer quadrant of right breast in female, estrogen receptor positive (HCC)  07/07/2024 Mammogram   Highly suspicious 1.9 cm irregular mass in the right breast at 10 o'clock, 6 cm the nipple, with associated mammographic architectural distortion. 2. Suspicious calcifications in the lateral and upper outer right breast spanning at least 5 cm from anterior to posterior. 3. Likely benign 1 mm group of calcifications in the medial right breast, only visualized discretely on the cc view.   07/26/2024 Pathology Results   1. Breast, right, needle core biopsy, 10:00 6 cmfn (clip heart) :      INVASIVE MODERATELY DIFFERENTIATED DUCTAL ADENOCARCINOMA, GRADE 2 (3+2+1)      TUBULE FORMATION: SCORE 3      NUCLEAR PLEOMORPHISM: SCORE 2      MITOTIC COUNT: SCORE 1      TOTAL SCORE: 6      OVERALL GRADE: GRADE 2 (6/9)      NEGATIVE FOR ANGIOLYMPHATIC INVASION      TUMOR MEASURES 13 MM IN GREATEST LINEAR EXTENT      ADJACENT FIBROCYSTIC CHANGES INCLUDING STROMAL FIBROSIS AND ADENOSIS      MICROCALCIFICATIONS PRESENT WITHIN BENIGN GNOSIS       2. Breast, right, needle core biopsy, calcifications, central, (clip x) :      FOCAL DUCTAL CARCINOMA IN SITU, INTERMEDIATE NUCLEAR GRADE, CRIBRIFORM AND SOLID  TYPES WITHOUT NECROSIS      NEGATIVE FOR INVASIVE CARCINOMA      DCIS MEASURES 4 MM IN GREATEST LINEAR EXTENT      EXTENSIVE STROMAL FIBROSIS WITH FOCAL USUAL DUCT HYPERPLASIA      MICROCALCIFICATIONS PRESENT WITHIN DCIS AND USUAL DUCT HYPERPLASIA    07/28/2024 Initial Diagnosis   Malignant neoplasm of  upper-outer quadrant of right breast in female, estrogen receptor positive (HCC)   08/02/2024 Cancer Staging   Staging form: Breast, AJCC 8th Edition - Clinical: Stage IA (cT1c, cN0, cM0, G2, ER+, PR-, HER2-) - Signed by Loretha Ash, MD on 08/02/2024 Histologic grading system: 3 grade system    Genetic Testing   Negative [facility] BRCAPlus + RNA (13 gene) panel. Report date 08/11/2024. 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 There is currently no evidence to suggest a significantly increased cancer risk for carriers of MUTYH pathogenic variants. Report date 08/14/2024.     Discussed the use of AI scribe software for clinical note transcription with the patient, who gave verbal consent to proceed.  History of Present Illness  Emily Hayes is a 73 year old female with a history of ovarian cancer many yrs ago and most recent diagnosis of breast cancerwho presents with concerns about metastatic disease following recent imaging studies. She is accompanied by her daughter and daughter's girlfriend.  She has undergone multiple imaging studies, including a PET scan and MRI of the brain, cervical spine, and thoracic spine, following the onset of back pain. The imaging revealed a breast mass on the right side, lesions in the brain, findings in the spinal canal, lung nodules, and hilar lymph nodes.  She experiences pain radiating down her left leg, exacerbated by coughing or sneezing, and describes a sharp pain and tingling in her feet. Sitting bothers her left side, and she has to sleep on her right side to manage discomfort. She describes the pain as 'fire' radiating from her spine to her hip, with a sensation of swelling in her thigh and hip.  No headaches, blurred vision, double vision, or speech difficulties, but she notes a decline in balance over the past year, which has been gradually worsening. Her gait has changed, which she attributes  to pain in her right and left sides.  She is currently managing her pain with Aleve and has tried gabapentin  and muscle relaxants without relief. She avoids narcotics due to nausea.  She has a history of ovarian cancer treated in 1983, a year after her daughter was born. She has dense fibrous breast tissue and has not been able to detect any masses during self-examinations.  She has a family history of a sister with a brain tumor, which was discovered after a head injury. Her sister's condition progressed rapidly within a year despite treatment.  She has a history of smoking half a pack of cigarettes a day for a long time and is currently trying to quit.  All other systems were reviewed with the patient and are negative.  MEDICAL HISTORY:  Past Medical History:  Diagnosis Date   Cervical radiculopathy    Family history of cancer    Family history of lung cancer    Fatigue    Generalized osteoarthrosis    HTN (hypertension)    Hyperlipidemia    Lateral epicondylitis of left elbow 06/28/2017   Left knee pain    Menopause    Neck pain    Ovarian cancer (HCC) 1983  Pneumonia    Pre-diabetes    Sciatica    Swelling of left knee joint     SURGICAL HISTORY: Past Surgical History:  Procedure Laterality Date   ABCESS DRAINAGE Left 09/15/2017   behind tonsil   ABDOMINAL HYSTERECTOMY  1983   APPENDECTOMY     BREAST BIOPSY Right 07/26/2024   US  RT BREAST BX W LOC DEV 1ST LESION IMG BX SPEC US  GUIDE 07/26/2024 [facility]   BREAST BIOPSY Right 07/26/2024   MM RT BREAST BX W LOC DEV 1ST LESION IMAGE BX SPEC STEREO GUIDE 07/26/2024 [facility]   EXCISION METACARPAL MASS Right 12/29/2023   Procedure: EXCISION METACARPAL MASS;  Surgeon: Jerri Kay HERO, MD;  Location: [facility];  Service: Orthopedics;  Laterality: Right;  right thumb and long finger cyst removal    SOCIAL HISTORY: Social History   Socioeconomic History   Marital status: Divorced     Spouse name: Not on file   Number of children: 2   Years of education: Not on file   Highest education level: Associate degree: academic program  Occupational History   Occupation: retired  Tobacco Use   Smoking status: Every Day    Current packs/day: 0.50    Average packs/day: 0.5 packs/day for 50.0 years (25.0 ttl pk-yrs)    Types: Cigarettes   Smokeless tobacco: Never  Vaping Use   Vaping status: Never Used  Substance and Sexual Activity   Alcohol use: No   Drug use: No   Sexual activity: Not Currently    Birth control/protection: Surgical    Comment: Hyst  Other Topics Concern   Not on file  Social History Narrative   Social History      Diet? none      Do you drink/eat things with caffeine? Chocolate- occasionally      Marital status?        divorced                            What year were you married? 1982      Do you live in a house, apartment, assisted living, condo, trailer, etc.? apartment      Is it one or more stories? 1      How many persons live in your home? 1       Do you have any pets in your home? (please list) yes, dog      Highest level of education completed? Associate degree- college      Current or past profession: retired      Do you exercise?           yes                           Type & how often? Walk- daily      Advanced Directives      Do you have a living will? yes      Do you have a DNR form?     yes                             If not, do you want to discuss one? yes      Do you have signed POA/HPOA for forms? no      Functional Status      Do you have difficulty bathing or dressing yourself?  Do you have difficulty preparing food or eating?       Do you have difficulty managing your medications?      Do you have difficulty managing your finances?      Do you have difficulty affording your medications?   Social Drivers of Corporate Investment Banker Strain: Low Risk  (05/29/2024)   Overall Financial Resource  Strain (CARDIA)    Difficulty of Paying Living Expenses: Not hard at all  Food Insecurity: No Food Insecurity (09/01/2024)   Hunger Vital Sign    Worried About Running Out of Food in the Last Year: Never true    Ran Out of Food in the Last Year: Never true  Transportation Needs: No Transportation Needs (09/01/2024)   PRAPARE - Administrator, Civil Service (Medical): No    Lack of Transportation (Non-Medical): No  Physical Activity: Sufficiently Active (10/01/2023)   Exercise Vital Sign    Days of Exercise per Week: 7 days    Minutes of Exercise per Session: 40 min  Stress: No Stress Concern Present (10/01/2023)   [employer] of Occupational Health - Occupational Stress Questionnaire    Feeling of Stress : Not at all  Social Connections: Socially Isolated (05/29/2024)   Social Connection and Isolation Panel    Frequency of Communication with Friends and Family: Three times a week    Frequency of Social Gatherings with Friends and Family: Twice a week    Attends Religious Services: Patient declined    Database Administrator or Organizations: No    Attends Engineer, Structural: Not on file    Marital Status: Divorced  Intimate Partner Violence: Not At Risk (01/05/2018)   Humiliation, Afraid, Rape, and Kick questionnaire    Fear of Current or Ex-Partner: No    Emotionally Abused: No    Physically Abused: No    Sexually Abused: No    FAMILY HISTORY: Family History  Problem Relation Age of Onset   Stroke Mother    Diabetes Mother    Dementia Mother    Alzheimer's disease Mother    Cancer Sister        unknown   Cancer Sister        unknown   Cancer Sister        unknown   Cancer Sister        brain tumor   Colon polyps Brother    Hypertension Brother    Hyperlipidemia Brother    Kidney failure Brother    Lung cancer Brother    Colon cancer Neg Hx    Esophageal cancer Neg Hx    Rectal cancer Neg Hx    Stomach cancer Neg Hx     ALLERGIES:   has no known allergies.  MEDICATIONS:  Current Outpatient Medications  Medication Sig Dispense Refill   acetaminophen  (TYLENOL ) 500 MG tablet Take 500 mg by mouth daily as needed. (Patient not taking: Reported on 08/02/2024)     B Complex-Folic Acid (B COMPLEX-VITAMIN B12 PO) Take 1 tablet by mouth daily.     Calcium  Citrate-Vitamin D (CALCIUM  CITRATE + D PO) Take 1 tablet by mouth 2 (two) times daily.     cyanocobalamin (VITAMIN B12) 500 MCG tablet Take 500 mcg by mouth daily.     diphenhydrAMINE (BENADRYL) 25 MG tablet Take 25 mg by mouth as needed.      losartan  (COZAAR ) 25 MG tablet Take 1 tablet (25 mg total) by mouth daily. 90 tablet 1  Magnesium Carbonate, Antacid, (MAGNESIUM CARBONATE PO) Take 240 mg by mouth 2 (two) times daily.     Misc Natural Products (COLON CLEANSE) CAPS Take by mouth as needed. As needed     rosuvastatin  (CRESTOR ) 40 MG tablet Take 1 tablet (40 mg total) by mouth daily. 90 tablet 1   Simethicone (GAS RELIEF EXTRA STRENGTH PO) Take 1 capsule by mouth as needed.     vitamin E 400 UNIT capsule Take 1,200 Units by mouth daily.      No current facility-administered medications for this visit.     PHYSICAL EXAMINATION: ECOG PERFORMANCE STATUS: 0 - Asymptomatic  Vitals:   09/01/24 1551  BP: (!) 170/55  Pulse: 95  Resp: 18  Temp: 98.3 F (36.8 C)  SpO2: 100%   Filed Weights   09/01/24 1551  Weight: 123 lb 6.4 oz (56 kg)    GENERAL:alert, no distress and comfortable   LABORATORY DATA:  I have reviewed the data as listed Lab Results  Component Value Date   WBC 6.6 08/02/2024   HGB 11.2 (L) 08/02/2024   HCT 34.9 (L) 08/02/2024   MCV 83.9 08/02/2024   PLT 239 08/02/2024     Chemistry      Component Value Date/Time   NA 140 08/02/2024 1255   NA 141 01/19/2017 0000   NA 141 01/19/2017 0000   K 4.3 08/02/2024 1255   CL 103 08/02/2024 1255   CO2 32 08/02/2024 1255   BUN 15 08/02/2024 1255   BUN 17 01/19/2017 0000   BUN 17 01/19/2017 0000    CREATININE 0.73 08/02/2024 1255   CREATININE 0.81 05/30/2024 0840   GLU 102 01/19/2017 0000   GLU 102 01/19/2017 0000      Component Value Date/Time   CALCIUM  10.1 08/02/2024 1255   ALKPHOS 54 08/02/2024 1255   AST 22 08/02/2024 1255   ALT 15 08/02/2024 1255   BILITOT 0.3 08/02/2024 1255       RADIOGRAPHIC STUDIES: I have personally reviewed the radiological images as listed and agreed with the findings in the report. MR THORACIC SPINE W WO CONTRAST Result Date: 08/31/2024 EXAM: MRI THORACIC SPINE WITH AND WITHOUT INTRAVENOUS CONTRAST 08/30/2024 07:53:07 PM TECHNIQUE: Multiplanar multisequence MRI of the thoracic spine was performed with and without the administration of intravenous contrast. COMPARISON: PET CT 08/29/2024. CLINICAL HISTORY: Metastatic disease evaluation. History of breast cancer with enhancing intradural masses in the lumbar spine. FINDINGS: BONES AND ALIGNMENT: Normal alignment. No fracture, suspicious marrow lesion, or significant marrow edema. No abnormal enhancement. SPINAL CORD: Normal spinal cord signal and morphology. No abnormal intradural enhancement. SOFT TISSUES: Bilateral lung nodules, more fully evaluated on the recent PET CT. 1 cm T2 hyperintense focus in the liver in segment 8, incompletely evaluated on this spine MRI though with low density and no appreciable abnormal FDG uptake on PET CT most compatible with a cyst. DEGENERATIVE CHANGES: Mild spondylosis with mild disc bulging in the lower thoracic spine. No significant stenosis. IMPRESSION: 1. No evidence of metastatic disease in the thoracic spine. Electronically signed by: Dasie Hamburg MD 08/31/2024 12:29 PM EST RP Workstation: [id number]   MR BRAIN W WO CONTRAST Result Date: 08/31/2024 EXAM: MRI BRAIN WITH AND WITHOUT CONTRAST 08/30/2024 07:52:52 PM TECHNIQUE: Multiplanar multisequence MRI of the head/brain was performed with and without the administration of intravenous contrast. CONTRAST: 5.5 mL  Gadobutrol. COMPARISON: None available. CLINICAL HISTORY: Metastatic disease evaluation. 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 There are extra-axial collections over  the bilateral parietal lobes which measure up to 1.3 cm in thickness on the right and 0.9 cm on the left, suggestive of subdural hygromas versus chronic subdural hematomas. There is associated mass effect on the parietal parenchyma. No midline shift. No acute intracranial hemorrhage. No hydrocephalus. The sella is unremarkable. Normal flow voids. There is a 3.1 x 1.8 x 2.2 cm lobulated enhancing mass in the medial and slightly posterior aspect of the right temporal lobe involving the periventricular white matter and also involving the body of the right hippocampus. There is associated vasogenic edema throughout the right temporal lobe. Additional FLAIR hyperintensity and thin curvilinear enhancement along the margin of the posterior right temporal horn and atrium of the left lateral ventricle concerning for ependymal involvement of the metastatic lesion versus reactive changes. There is an additional 2.2 x 1.9 x 1.7 cm peripherally enhancing lesion in the anterior left temporal lobe with associated vasogenic edema. There is a 0.9 x 0.6 x 0.4 cm lesion within the inferior aspect of the left temporooccipital lobes seen on series 16 image 22 and series 17 image 9. There is a 1.0 x 0.6 x 0.4 cm lesion within the inferior aspect of the left temporooccipital lobes seen on series 16 image 22 and series 17 image 9. There is a 1.0 x 0.8 x 0.9 cm enhancing lesion in the superior left frontal lobe involving the left superior frontal gyrus seen on series 16 image 49 and series 17 image 18. There is a small focus of enhancement involving the right lentiform nucleus which could reflect an additional lesion seen on series 16 image 31. Minimal supratentorial white matter signal abnormality suggestive of chronic microvascular  ischemic changes. ORBITS: No acute abnormality. SINUSES: No acute abnormality. BONES AND SOFT TISSUES: Normal bone marrow signal and enhancement. No acute soft tissue abnormality. IMPRESSION: 1. Multiple enhancing intracranial lesions consistent with metastatic disease, including a dominant 3.1 x 1.8 x 2.2 cm right temporal lobe mass with suspected ependymal involvement creating potential from drop metastasis. Additional 2.2 cm anterior left temporal lobe lesion, 1.0 cm left superior frontal gyrus lesion, and small lesions in the left temporooccipital region and right lentiform nucleus. 2. Bilateral extra-axial parietal collections suggestive of subdural hygromas versus chronic subdural hematomas with associated parietal mass effect. No midline shift. 3. 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 No acute infarct. Electronically signed by: Donnice Mania MD 08/31/2024 12:21 PM EST RP Workstation: [id number]   MR CERVICAL SPINE W WO CONTRAST Result Date: 08/31/2024 EXAM: MRI CERVICAL SPINE WITH AND WITHOUT CONTRAST 08/30/2024 07:52:26 PM TECHNIQUE: Multiplanar multisequence MRI of the cervical spine was performed without and with the administration of 5.5 mL of gadobutrol (GADAVIST) 1 MMOL/ML injection. COMPARISON: PET CT 08/29/2024. CLINICAL HISTORY: Metastatic disease evaluation. History of breast cancer with enhancing intradural masses in the lumbar spine. FINDINGS: BONES AND ALIGNMENT: Cervical spine straightening. Trace anterolisthesis of C3 on C4 and C4 on C5. No fracture, suspicious marrow lesion, or significant marrow edema. No abnormal enhancement. SPINAL CORD: Normal spinal cord signal and morphology. No abnormal intradural enhancement. SOFT TISSUES: No paraspinal mass. DISC LEVELS: Mild cervical spondylosis with minor multilevel disc bulging and preserved disc heights. Focally severe right facet arthrosis at C3-C4 with prominent sclerosis and resultant moderate right neural foraminal stenosis. No  spinal stenosis. IMPRESSION: 1. No evidence of metastatic disease in the cervical spine. 2. Mild cervical spondylosis without spinal stenosis. 3. Severe right facet arthrosis at C3-4. Electronically signed by: Dasie Hamburg MD 08/31/2024 12:19 PM EST  RP Workstation: [id number]   NM PET Image Initial (PI) Skull Base To Thigh Result Date: 08/30/2024 EXAM: PET AND CT SKULL BASE TO MID THIGH 08/29/2024 05:15:09 PM TECHNIQUE: RADIOPHARMACEUTICAL: 5.98 mCi F-18 FDG Uptake time 60 minutes. Glucose level 142 mg/dl. PET imaging was acquired from the base of the skull to the mid thighs. Non-contrast enhanced computed tomography was obtained for attenuation correction and anatomic localization. COMPARISON: MRI lumbar spine 08/21/2024. CLINICAL HISTORY: Breast cancer, invasive, stage I/II/III, initial workup; new breast cancer/recent MRI's concerning for metastatic disease. FINDINGS: HEAD AND NECK: No metabolically active cervical lymphadenopathy. CHEST: Within the left upper lobe, a nodule measuring 15 mm on image 75 demonstrates hypermetabolic activity with an SUV max of 6.6. Hypermetabolic left hilar activity on image 71 suggests a metastatic hilar node. Within the right upper lobe, an irregular nodule measuring 18 mm on image 66 has mild metabolic activity with an SUV max of 2.7. ABDOMEN AND PELVIS: No metabolically active lymphadenopathy. Physiologic activity within the gastrointestinal and genitourinary systems. BONES AND SOFT TISSUE: A focus of intense metabolic activity is present within the spinal canal posterior to the L2 vertebral body. This lesion corresponds to a lesion on the comparison MRI. Smaller lesions within the canal at L5 also correspond to MRI metastatic lesions. These lesions are difficult to place on the CT portion of the exam. Within the posterior right breast, a solid nodule measuring 15 mm demonstrates hypermetabolic activity on image 72. A biopsy clip is associated with this right breast mass. No  musculoskeletal metastases. IMPRESSION: 1. Hypermetabolic right breast mass with biopsy clip, consistent with primary breast carcinoma. 2. Hypermetabolic left upper lobe nodule and hypermetabolic left hilar node, consistent with pulmonary metastases. 3. Mildly hypermetabolic right upper lobe nodule, consistent with pulmonary metastasis. 4. Hypermetabolic spinal canal lesions at L2 and L5, consistent with spinal metastases. Electronically signed by: Norleen Boxer MD 08/30/2024 09:56 AM EST RP Workstation: [id number]   MR Lumbar Spine W Wo Contrast Result Date: 08/25/2024 EXAM: MRI LUMBAR SPINE 08/21/2024 11:02:57 AM TECHNIQUE: Multiplanar multisequence MRI of the lumbar spine was performed without and with the administration of intravenous contrast. CONTRAST: 10 mL of Vueway. COMPARISON: Lumbar spine series dated 06/05/2024. CLINICAL HISTORY: Breast cancer, invasive, initial workup, rule out malignancy, and back pain. FINDINGS: BONES AND ALIGNMENT: Normal alignment. Normal vertebral body heights. Bone marrow signal is unremarkable. SPINAL CORD: The conus medullaris terminates at T12-L1. The lower spinal cord appears normal. SOFT TISSUES: No paraspinal mass. L1-L2: No significant disc herniation. No spinal canal stenosis or neural foraminal narrowing. L2-L3: There is a focal area of nodular enhancement within the thecal sac, concerning for drop metastases. This lesion extends approximately 2 cm in craniocaudad length. There is mild diffuse disc bulging, causing mild central spinal canal stenosis. No neural foraminal narrowing. L3-L4: There is a focal area of nodular enhancement within the thecal sac, concerning for drop metastases. No significant disc herniation. No spinal canal stenosis or neural foraminal narrowing. L4-L5: There is a focal area of nodular enhancement within the thecal sac, concerning for drop metastases. There is mild diffuse disc bulging and facet hypertrophy, causing mild-to-moderate central  spinal canal stenosis and bilateral lateral recess stenosis. No neural foraminal narrowing. L5-S1: There is mild bilateral facet arthrosis. No significant disc herniation. No spinal canal stenosis or neural foraminal narrowing. IMPRESSION: 1. Three intradural, nodular enhancing lesions at L2-3, L3-4, and L4-5, most concerning for drop metastases in the setting of breast cancer; largest at L2-3 measuring approximately 2 cm craniocaudally. Recommend  neuro-oncology consultation and contrast-enhanced MRI of the entire neuraxis (brain and full spine) for staging. 2. Mild diffuse disc bulge and facet hypertrophy at L4-5 causing mild-to-moderate central canal stenosis and bilateral lateral recess stenosis. 3. Mild diffuse disc bulge at L2-3 causing mild central canal stenosis. 4. Mild bilateral facet arthrosis at L5-S1. Electronically signed by: Evalene Coho MD 08/25/2024 10:00 AM EST RP Workstation: [id number]   MR SACRUM SI JOINTS W WO CONTRAST Result Date: 08/23/2024 CLINICAL DATA:  History of breast cancer.  Back pain. EXAM: MRI LUMBAR SPINE WITHOUT AND WITH CONTRAST TECHNIQUE: Multiplanar and multiecho pulse sequences of the lumbar spine were obtained without and with intravenous contrast. CONTRAST:  5.5 mL Vueway, 3 mL wasted COMPARISON:  None Available. FINDINGS: Bones/Joint/Cartilage No fracture or dislocation. Normal alignment. No joint effusion. No marrow signal abnormality. No SI joint widening or erosive changes. No subchondral reactive marrow changes. No SI joint effusion. Minimal grade 1 anterolisthesis of L4 on L5 secondary to mild facet arthropathy. At L4-5 there is moderate bilateral facet arthropathy with ligamentum flavum thickening, left lateral recess stenosis, and mild central canal stenosis. Ligaments, Muscles and Tendons Muscles are normal. No muscle atrophy. No muscle edema. Piriformis muscles are normal bilaterally without signal abnormality. Soft tissue No fluid collection or hematoma. No  soft tissue mass. Normal neurovascular bundles. IMPRESSION: 1. No acute osseous injury of the sacrum and coccyx. 2. No evidence of metastatic disease of the sacrum and coccyx. 3. At L4-5 there is moderate bilateral facet arthropathy with ligamentum flavum thickening, left lateral recess stenosis, and mild central canal stenosis. Electronically Signed   By: Julaine Blanch M.D.   On: 08/23/2024 07:41   MR BREAST BILATERAL W WO CONTRAST INC CAD Result Date: 08/21/2024 CLINICAL DATA:  Staging exam. Patient had 2 recent biopsies of the right breast demonstrating invasive ductal carcinoma and DCIS. EXAM: BILATERAL BREAST MRI WITH AND WITHOUT CONTRAST TECHNIQUE: Multiplanar, multisequence MR images of both breasts were obtained prior to and following the intravenous administration of 6 ml of Vueway Three-dimensional MR images were rendered by post-processing of the original MR data on an independent workstation. The three-dimensional MR images were interpreted, and findings are reported in the following complete MRI report for this study. Three dimensional images were evaluated at the independent interpreting workstation using the DynaCAD thin client. COMPARISON:  None available. FINDINGS: Breast composition: d. Extreme fibroglandular tissue. Background parenchymal enhancement: Mild Right breast: At the site of biopsy-proven invasive carcinoma in the upper outer posterior right breast there is an irregular enhancing mass measuring 2.1 x 1.8 x 2.3 cm (series 11, image 55). In the upper central right breast there is a large area of suspicious clumped non mass enhancement measuring 6.3 cm in AP dimension (series 11, image 33). At the site of biopsy proven DCIS in the central right breast there is additional clumped non mass enhancement which extends anterior and inferior to the biopsy site overall spanning at least 2.9 cm and possibly up to 4.2 cm. There is additional non mass enhancement extending laterally which is likely  related to the biopsy tract. Left breast: There is a small enhancing mass in the upper central posterior left breast measuring 0.8 cm (series 11, image 66). Lymph nodes: No abnormal appearing lymph nodes. Ancillary findings:  None. IMPRESSION: 1. Abnormal findings throughout much of the right breast involving the upper inner, upper outer and lower outer right breast. At the site of biopsy proven invasive malignancy in the upper outer right breast there is  an enhancing mass measuring 2.3 cm. 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 There is a large area of suspicious clumped non mass enhancement in the upper central right breast measuring up to 6.3 cm. 2. Indeterminate small enhancing mass in the upper central left breast measuring 0.8 cm. RECOMMENDATION: 1. If breast conservation/neoadjuvant is being considered for the right breast recommend several additional MRI guided biopsies (x3) including the anterior and posterior extent of the suspicious non mass enhancement in the upper central right breast (series 11, image 33), which has not been biopsied, as well as the more anterior portion of the non mass enhancement in the central right breast targeting the largest clumped area (series 11, image 78). 2.  MRI guided core needle biopsy x1 of the left breast. BI-RADS CATEGORY  4: Suspicious. Electronically Signed   By: Inocente Ast M.D.   On: 08/21/2024 10:44    All questions were answered. The patient knows to call the clinic with any problems, questions or concerns. I spent 45 minutes in the care of this patient including H and P, review of records, counseling and coordination of care.     Amber Stalls, MD 09/01/2024 6:17 PM

## 2024-09-01 NOTE — Progress Notes (Signed)
 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 Medrol  dose pack and gabapentin  did not help. Reports she is having some neuropathy in foot and swelling in thigh.  08/29/2024 PET Scan    Past/Anticipated chemotherapy by medical oncology, if any:   Patient's main complaints related to symptomatic tumor(s) are:    Pain on a scale of 0-10 is:  Left hip, thigh and foot pains 5-8/10 depending on position.  She is taking tramadol and aleve.  She reports continues shooting pains down her left lumbar into her legs, exacerbated with coughing or sneezing.   Ambulatory status? Walker? Wheelchair?: Ambulatory  Recent neurologic symptoms, if any:  Seizures: No Headaches: None Nausea: She reports some nausea with medications. Dizziness/ataxia: No Difficulty with hand coordination: She reports her balance is a little off.  Denies fine motor changes. Focal numbness/weakness: She reports left side weakness, numbness, tingling. Visual deficits/changes: None Confusion/Memory deficits: No  SAFETY ISSUES: Prior radiation? No Pacemaker/ICD? No Possible current pregnancy? Hysterectomy Is the patient on methotrexate? No  Additional Complaints / other details:

## 2024-09-01 NOTE — Addendum Note (Signed)
 Addended by: Tagen Milby, NAGA VENKATA KALIPRAVEENA on: 09/01/2024 06:26 PM   Modules accepted: Orders

## 2024-09-02 NOTE — Progress Notes (Signed)
 Radiation Oncology         [phone]) [phone] ________________________________  Outpatient Re-Consultation  Name: Emily Hayes MRN: [id number]  Date: 09/04/2024  DOB: Feb 27, 1951  RR:Joofjw, Elveria LABOR, MD  [name] LABOR, MD   REFERRING PHYSICIAN: [name] LABOR, MD  DIAGNOSIS: No diagnosis found.   Cancer Staging  Malignant neoplasm of upper-outer quadrant of right breast in female, estrogen receptor positive (HCC) Staging form: Breast, AJCC 8th Edition - Clinical: Stage IA (cT1c, cN0, cM0, G2, ER+, PR-, HER2-) - Signed by Loretha Ash, MD on 08/02/2024 Histologic grading system: 3 grade system  Stage IA Right Breast UOQ, Invasive ductal carcinoma with focal intermediate grade DCIS, ER+ / PR- / Her2 neg, Grade 2 : now with evidence of widespread metastatic disease to the lungs, brain, and lumbar spine  CHIEF COMPLAINT: Here to discuss management of metastatic breast cancer  HISTORY OF PRESENT ILLNESS::Emily Hayes is a 73 y.o. female who returns today to review recent imaging and to discuss the role of radiation therapy in management of her recently diagnosed metastatic breast cancer. She was last seen here on her breast clinic consultation date of 08/02/24.   To review from her breast clinic consultation visit: she endorsed significant sciatic pain and reported having had a recent x-ray and CT scan on the lumbar spine performed at [facility] which were not available at that time. Subsequent CT of the lumbar spine performed at [facility] on 08/01/24 demonstrated no acute findings in the lumbar spine with multilevel degenerative changes present. In the setting of her recent diagnosis (despite negative CT findings), further imaging of the spine was recommended (dates and results detailed below).   Pertinent imaging performed since her breast clinic consultation date includes:  -- Bilateral breast MRI on 08/20/24 which showed abnormal findings throughout much of the right breast  involving the upper inner, upper outer and lower outer right breast, characterized by: the biopsy proven malignancy/mass in the upper outer right breast measuring 2.3 cm; a linear non-mass enhancement spanning at least 2.9 cm and possibly up to 4.2 cm at the site of the biopsy proven DCIS in the central right breast; and a large area of suspicious clumped non-mass enhancement in the upper central right breast measuring up to 6.3 cm. A indeterminate small enhancing mass was also demonstrated in the upper central left breast, measuring approximately 0.8 cm.  -- MRI of the lumbar spine w/ and w/out contrast on 08/21/24 demonstrated: three intradural nodular enhancing lesions at L2-3, L3-4, and L4-5, most concerning for drop metastases in the setting of breast cancer; the largest of which located at L2-3, and measuring approximately 2 cm in the craniocaudal dimension; mild diffuse disc bulging and facet hypertrophy at L4-5 causing mild-to-moderate central canal stenosis and bilateral lateral recess stenosis; mild diffuse disc bulge at L2-3 causing mild central canal stenosis; and mild bilateral facet arthrosis at L5-S1. An MRI of the sacral spine was also performed at that time which thankfully showed no evidence of osseous metastatic disease in the sacrum and coccyx.  -- PET scan on 08/29/24 demonstrated: hypermetabolism associated with right breast mass consistent with primary breast malignancy; hypermetabolism associated with the LUL pulmonary nodule and left hilar node in keeping with pulmonary metastatic disease; mild hypermetabolism associated with the RUL pulmonary nodule consistent with metastatic disease; and hypermetabolism associated with the spinal canal lesions at L2 and L5 consistent with osseous metastatic disease.  -- MRI of the cervical spine on 08/30/24 showed no evidence of metastatic disease  in the cervical spine.  -- MRI of the brain w/ and w/out contrast on 08/30/24 unfortunately demonstrated  multiple enhancing intracranial lesions consistent with metastatic disease, characterized by: a dominant 3.1 x 1.8 x 2.2 cm right temporal lobe mass with suspected ependymal involvement creating potential from drop metastasis; a 2.2 cm anterior left temporal lobe lesion; a 1.0 cm left superior frontal gyrus lesion; and several smaller lesions in the left temporooccipital region and right lentiform nucleus. 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  -- MRI of the thoracic spine on 08/30/24 thankfully showed no evidence of metastatic disease in the thoracic spine.   She also underwent genetic testing on her breast clinic consultation date. 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 This mutation is associated with an autosomal recessive condition. There is no current evidence to suggest that this mutation is associated with an increased cancer risk for carriers.   In most recent history, she was seen by Dr. Loretha on 09/01/24 to discuss her recent imaging findings. For her intracranial metastases, Dr. Loretha discussed considering WBRT. Biopsies of the lung nodules and right hilar lymph node were also discussed to confirm a breast primary, along with consideration of potential radiation therapy to the metastases in her spine.   PREVIOUS RADIATION THERAPY: No  PAST MEDICAL HISTORY:  has a past medical history of Cervical radiculopathy, Family history of cancer, Family history of lung cancer, Fatigue, Generalized osteoarthrosis, HTN (hypertension), Hyperlipidemia, Lateral epicondylitis of left elbow (06/28/2017), Left knee pain, Menopause, Neck pain, Ovarian cancer (HCC) (1983), Pneumonia, Pre-diabetes,  Sciatica, and Swelling of left knee joint.    PAST SURGICAL HISTORY: Past Surgical History:  Procedure Laterality Date   ABCESS DRAINAGE Left 09/15/2017   behind tonsil   ABDOMINAL HYSTERECTOMY  1983   APPENDECTOMY     BREAST BIOPSY Right 07/26/2024   US  RT BREAST BX W LOC DEV 1ST LESION IMG BX SPEC US  GUIDE 07/26/2024 [facility]   BREAST BIOPSY Right 07/26/2024   MM RT BREAST BX W LOC DEV 1ST LESION IMAGE BX SPEC STEREO GUIDE 07/26/2024 [facility]   EXCISION METACARPAL MASS Right 12/29/2023   Procedure: EXCISION METACARPAL MASS;  Surgeon: Jerri Kay HERO, MD;  Location: [facility];  Service: Orthopedics;  Laterality: Right;  right thumb and long finger cyst removal    FAMILY HISTORY: family history includes Alzheimer's disease in her mother; Cancer in her sister, sister, sister, and sister; Colon polyps in her brother; Dementia in her mother; Diabetes in her mother; Hyperlipidemia in her brother; Hypertension in her brother; Kidney failure in her brother; Lung cancer in her brother; Stroke in her mother.  SOCIAL HISTORY:  reports that she has been smoking cigarettes. She has a 25 pack-year smoking history. She has never used smokeless tobacco. She reports that she does not drink alcohol and does not use drugs.  ALLERGIES: Patient has no known allergies.  MEDICATIONS:  Current Outpatient Medications  Medication Sig Dispense Refill   acetaminophen  (TYLENOL ) 500 MG tablet Take 500 mg by mouth daily as needed. (Patient not taking: Reported on 08/02/2024)     B Complex-Folic Acid (B COMPLEX-VITAMIN B12 PO) Take 1 tablet by mouth daily.     Calcium  Citrate-Vitamin D (CALCIUM  CITRATE + D PO) Take 1 tablet  by mouth 2 (two) times daily.     cyanocobalamin (VITAMIN B12) 500 MCG tablet Take 500 mcg by mouth daily.     diphenhydrAMINE (BENADRYL) 25 MG tablet Take 25 mg by mouth as needed.      losartan  (COZAAR ) 25 MG tablet Take 1 tablet (25 mg total) by mouth daily. 90  tablet 1   Magnesium Carbonate, Antacid, (MAGNESIUM CARBONATE PO) Take 240 mg by mouth 2 (two) times daily.     Misc Natural Products (COLON CLEANSE) CAPS Take by mouth as needed. As needed     rosuvastatin  (CRESTOR ) 40 MG tablet Take 1 tablet (40 mg total) by mouth daily. 90 tablet 1   Simethicone (GAS RELIEF EXTRA STRENGTH PO) Take 1 capsule by mouth as needed.     vitamin E 400 UNIT capsule Take 1,200 Units by mouth daily.      No current facility-administered medications for this encounter.    REVIEW OF SYSTEMS:  Notable for that above.   PHYSICAL EXAM:  vitals were not taken for this visit.   General: Alert and oriented, in no acute distress *** HEENT: Head is normocephalic. Extraocular movements are intact. Oropharynx is clear. Neck: Neck is supple, no palpable cervical or supraclavicular lymphadenopathy. Heart: Regular in rate and rhythm with no murmurs, rubs, or gallops. Chest: Clear to auscultation bilaterally, with no rhonchi, wheezes, or rales. Abdomen: Soft, nontender, nondistended, with no rigidity or guarding. Extremities: No cyanosis or edema. Lymphatics: see Neck Exam Skin: No concerning lesions. Musculoskeletal: symmetric strength and muscle tone throughout. Neurologic: Cranial nerves II through XII are grossly intact. No obvious focalities. Speech is fluent. Coordination is intact. Psychiatric: Judgment and insight are intact. Affect is appropriate.   ECOG = ***  0 - Asymptomatic (Fully active, able to carry on all predisease activities without restriction)  1 - Symptomatic but completely ambulatory (Restricted in physically strenuous activity but ambulatory and able to carry out work of a light or sedentary nature. For example, light housework, office work)  2 - Symptomatic, <50% in bed during the day (Ambulatory and capable of all self care but unable to carry out any work activities. Up and about more than 50% of waking hours)  3 - Symptomatic, >50% in bed, but  not bedbound (Capable of only limited self-care, confined to bed or chair 50% or more of waking hours)  4 - Bedbound (Completely disabled. Cannot carry on any self-care. Totally confined to bed or chair)  5 - Death   Raylene MM, Creech RH, Tormey DC, et al. [phone]). Toxicity and response criteria of the [facility]. Am. DOROTHA Bridges. Oncol. 5 (6): 649-55   LABORATORY DATA:  Lab Results  Component Value Date   WBC 6.6 08/02/2024   HGB 11.2 (L) 08/02/2024   HCT 34.9 (L) 08/02/2024   MCV 83.9 08/02/2024   PLT 239 08/02/2024   CMP     Component Value Date/Time   NA 140 08/02/2024 1255   NA 141 01/19/2017 0000   NA 141 01/19/2017 0000   K 4.3 08/02/2024 1255   CL 103 08/02/2024 1255   CO2 32 08/02/2024 1255   GLUCOSE 128 (H) 08/02/2024 1255   BUN 15 08/02/2024 1255   BUN 17 01/19/2017 0000   BUN 17 01/19/2017 0000   CREATININE 0.73 08/02/2024 1255   CREATININE 0.81 05/30/2024 0840   CALCIUM  10.1 08/02/2024 1255   PROT 7.0 08/02/2024 1255   ALBUMIN 4.5 08/02/2024 1255   AST 22 08/02/2024 1255  ALT 15 08/02/2024 1255   ALKPHOS 54 08/02/2024 1255   BILITOT 0.3 08/02/2024 1255   EGFR 82 09/27/2023 0815   GFRNONAA >60 08/02/2024 1255   GFRNONAA 80 12/13/2020 0000         RADIOGRAPHY: MR THORACIC SPINE W WO CONTRAST Result Date: 08/31/2024 EXAM: MRI THORACIC SPINE WITH AND WITHOUT INTRAVENOUS CONTRAST 08/30/2024 07:53:07 PM TECHNIQUE: Multiplanar multisequence MRI of the thoracic spine was performed with and without the administration of intravenous contrast. COMPARISON: PET CT 08/29/2024. CLINICAL HISTORY: Metastatic disease evaluation. History of breast cancer with enhancing intradural masses in the lumbar spine. FINDINGS: BONES AND ALIGNMENT: Normal alignment. No fracture, suspicious marrow lesion, or significant marrow edema. No abnormal enhancement. SPINAL CORD: Normal spinal cord signal and morphology. No abnormal intradural enhancement. SOFT TISSUES:  Bilateral lung nodules, more fully evaluated on the recent PET CT. 1 cm T2 hyperintense focus in the liver in segment 8, incompletely evaluated on this spine MRI though with low density and no appreciable abnormal FDG uptake on PET CT most compatible with a cyst. DEGENERATIVE CHANGES: Mild spondylosis with mild disc bulging in the lower thoracic spine. No significant stenosis. IMPRESSION: 1. No evidence of metastatic disease in the thoracic spine. Electronically signed by: Dasie Hamburg MD 08/31/2024 12:29 PM EST RP Workstation: [id number]   MR BRAIN W WO CONTRAST Result Date: 08/31/2024 EXAM: MRI BRAIN WITH AND WITHOUT CONTRAST 08/30/2024 07:52:52 PM TECHNIQUE: Multiplanar multisequence MRI of the head/brain was performed with and without the administration of intravenous contrast. CONTRAST: 5.5 mL Gadobutrol. COMPARISON: None available. CLINICAL HISTORY: Metastatic disease evaluation. 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 There are extra-axial collections over the bilateral parietal lobes which measure up to 1.3 cm in thickness on the right and 0.9 cm on the left, suggestive of subdural hygromas versus chronic subdural hematomas. There is associated mass effect on the parietal parenchyma. No midline shift. No acute intracranial hemorrhage. No hydrocephalus. The sella is unremarkable. Normal flow voids. There is a 3.1 x 1.8 x 2.2 cm lobulated enhancing mass in the medial and slightly posterior aspect of the right temporal lobe involving the periventricular white matter and also involving the body of the right hippocampus. There is associated vasogenic edema throughout the right temporal lobe. Additional FLAIR hyperintensity and thin curvilinear enhancement along the margin of the posterior right temporal horn and atrium of the left lateral ventricle concerning for ependymal involvement of the metastatic lesion versus reactive changes. There is an additional 2.2 x 1.9 x  1.7 cm peripherally enhancing lesion in the anterior left temporal lobe with associated vasogenic edema. There is a 0.9 x 0.6 x 0.4 cm lesion within the inferior aspect of the left temporooccipital lobes seen on series 16 image 22 and series 17 image 9. There is a 1.0 x 0.6 x 0.4 cm lesion within the inferior aspect of the left temporooccipital lobes seen on series 16 image 22 and series 17 image 9. There is a 1.0 x 0.8 x 0.9 cm enhancing lesion in the superior left frontal lobe involving the left superior frontal gyrus seen on series 16 image 49 and series 17 image 18. There is a small focus of enhancement involving the right lentiform nucleus which could reflect an additional lesion seen on series 16 image 31. Minimal supratentorial white matter signal abnormality suggestive of chronic microvascular ischemic changes. ORBITS: No acute abnormality. SINUSES: No acute abnormality. BONES AND SOFT TISSUES: Normal bone marrow signal and enhancement. No acute soft tissue abnormality.  IMPRESSION: 1. Multiple enhancing intracranial lesions consistent with metastatic disease, including a dominant 3.1 x 1.8 x 2.2 cm right temporal lobe mass with suspected ependymal involvement creating potential from drop metastasis. Additional 2.2 cm anterior left temporal lobe lesion, 1.0 cm left superior frontal gyrus lesion, and small lesions in the left temporooccipital region and right lentiform nucleus. 2. Bilateral extra-axial parietal collections suggestive of subdural hygromas versus chronic subdural hematomas with associated parietal mass effect. No midline shift. 3. 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 No acute infarct. Electronically signed by: Donnice Mania MD 08/31/2024 12:21 PM EST RP Workstation: [id number]   MR CERVICAL SPINE W WO CONTRAST Result Date: 08/31/2024 EXAM: MRI CERVICAL SPINE WITH AND WITHOUT CONTRAST 08/30/2024 07:52:26 PM TECHNIQUE: Multiplanar multisequence MRI of the cervical spine was  performed without and with the administration of 5.5 mL of gadobutrol (GADAVIST) 1 MMOL/ML injection. COMPARISON: PET CT 08/29/2024. CLINICAL HISTORY: Metastatic disease evaluation. History of breast cancer with enhancing intradural masses in the lumbar spine. FINDINGS: BONES AND ALIGNMENT: Cervical spine straightening. Trace anterolisthesis of C3 on C4 and C4 on C5. No fracture, suspicious marrow lesion, or significant marrow edema. No abnormal enhancement. SPINAL CORD: Normal spinal cord signal and morphology. No abnormal intradural enhancement. SOFT TISSUES: No paraspinal mass. DISC LEVELS: Mild cervical spondylosis with minor multilevel disc bulging and preserved disc heights. Focally severe right facet arthrosis at C3-C4 with prominent sclerosis and resultant moderate right neural foraminal stenosis. No spinal stenosis. IMPRESSION: 1. No evidence of metastatic disease in the cervical spine. 2. Mild cervical spondylosis without spinal stenosis. 3. Severe right facet arthrosis at C3-4. Electronically signed by: Dasie Hamburg MD 08/31/2024 12:19 PM EST RP Workstation: [id number]   NM PET Image Initial (PI) Skull Base To Thigh Result Date: 08/30/2024 EXAM: PET AND CT SKULL BASE TO MID THIGH 08/29/2024 05:15:09 PM TECHNIQUE: RADIOPHARMACEUTICAL: 5.98 mCi F-18 FDG Uptake time 60 minutes. Glucose level 142 mg/dl. PET imaging was acquired from the base of the skull to the mid thighs. Non-contrast enhanced computed tomography was obtained for attenuation correction and anatomic localization. COMPARISON: MRI lumbar spine 08/21/2024. CLINICAL HISTORY: Breast cancer, invasive, stage I/II/III, initial workup; new breast cancer/recent MRI's concerning for metastatic disease. FINDINGS: HEAD AND NECK: No metabolically active cervical lymphadenopathy. CHEST: Within the left upper lobe, a nodule measuring 15 mm on image 75 demonstrates hypermetabolic activity with an SUV max of 6.6. Hypermetabolic left hilar activity on image  71 suggests a metastatic hilar node. Within the right upper lobe, an irregular nodule measuring 18 mm on image 66 has mild metabolic activity with an SUV max of 2.7. ABDOMEN AND PELVIS: No metabolically active lymphadenopathy. Physiologic activity within the gastrointestinal and genitourinary systems. BONES AND SOFT TISSUE: A focus of intense metabolic activity is present within the spinal canal posterior to the L2 vertebral body. This lesion corresponds to a lesion on the comparison MRI. Smaller lesions within the canal at L5 also correspond to MRI metastatic lesions. These lesions are difficult to place on the CT portion of the exam. Within the posterior right breast, a solid nodule measuring 15 mm demonstrates hypermetabolic activity on image 72. A biopsy clip is associated with this right breast mass. No musculoskeletal metastases. IMPRESSION: 1. Hypermetabolic right breast mass with biopsy clip, consistent with primary breast carcinoma. 2. Hypermetabolic left upper lobe nodule and hypermetabolic left hilar node, consistent with pulmonary metastases. 3. Mildly hypermetabolic right upper lobe nodule, consistent with pulmonary metastasis. 4. Hypermetabolic spinal canal lesions at L2 and L5, consistent  with spinal metastases. Electronically signed by: Norleen Boxer MD 08/30/2024 09:56 AM EST RP Workstation: [id number]   MR Lumbar Spine W Wo Contrast Result Date: 08/25/2024 EXAM: MRI LUMBAR SPINE 08/21/2024 11:02:57 AM TECHNIQUE: Multiplanar multisequence MRI of the lumbar spine was performed without and with the administration of intravenous contrast. CONTRAST: 10 mL of Vueway. COMPARISON: Lumbar spine series dated 06/05/2024. CLINICAL HISTORY: Breast cancer, invasive, initial workup, rule out malignancy, and back pain. FINDINGS: BONES AND ALIGNMENT: Normal alignment. Normal vertebral body heights. Bone marrow signal is unremarkable. SPINAL CORD: The conus medullaris terminates at T12-L1. The lower spinal cord  appears normal. SOFT TISSUES: No paraspinal mass. L1-L2: No significant disc herniation. No spinal canal stenosis or neural foraminal narrowing. L2-L3: There is a focal area of nodular enhancement within the thecal sac, concerning for drop metastases. This lesion extends approximately 2 cm in craniocaudad length. There is mild diffuse disc bulging, causing mild central spinal canal stenosis. No neural foraminal narrowing. L3-L4: There is a focal area of nodular enhancement within the thecal sac, concerning for drop metastases. No significant disc herniation. No spinal canal stenosis or neural foraminal narrowing. L4-L5: There is a focal area of nodular enhancement within the thecal sac, concerning for drop metastases. There is mild diffuse disc bulging and facet hypertrophy, causing mild-to-moderate central spinal canal stenosis and bilateral lateral recess stenosis. No neural foraminal narrowing. L5-S1: There is mild bilateral facet arthrosis. No significant disc herniation. No spinal canal stenosis or neural foraminal narrowing. IMPRESSION: 1. Three intradural, nodular enhancing lesions at L2-3, L3-4, and L4-5, most concerning for drop metastases in the setting of breast cancer; largest at L2-3 measuring approximately 2 cm craniocaudally. Recommend neuro-oncology consultation and contrast-enhanced MRI of the entire neuraxis (brain and full spine) for staging. 2. Mild diffuse disc bulge and facet hypertrophy at L4-5 causing mild-to-moderate central canal stenosis and bilateral lateral recess stenosis. 3. Mild diffuse disc bulge at L2-3 causing mild central canal stenosis. 4. Mild bilateral facet arthrosis at L5-S1. Electronically signed by: Evalene Coho MD 08/25/2024 10:00 AM EST RP Workstation: [id number]   MR SACRUM SI JOINTS W WO CONTRAST Result Date: 08/23/2024 CLINICAL DATA:  History of breast cancer.  Back pain. EXAM: MRI LUMBAR SPINE WITHOUT AND WITH CONTRAST TECHNIQUE: Multiplanar and multiecho pulse  sequences of the lumbar spine were obtained without and with intravenous contrast. CONTRAST:  5.5 mL Vueway, 3 mL wasted COMPARISON:  None Available. FINDINGS: Bones/Joint/Cartilage No fracture or dislocation. Normal alignment. No joint effusion. No marrow signal abnormality. No SI joint widening or erosive changes. No subchondral reactive marrow changes. No SI joint effusion. Minimal grade 1 anterolisthesis of L4 on L5 secondary to mild facet arthropathy. At L4-5 there is moderate bilateral facet arthropathy with ligamentum flavum thickening, left lateral recess stenosis, and mild central canal stenosis. Ligaments, Muscles and Tendons Muscles are normal. No muscle atrophy. No muscle edema. Piriformis muscles are normal bilaterally without signal abnormality. Soft tissue No fluid collection or hematoma. No soft tissue mass. Normal neurovascular bundles. IMPRESSION: 1. No acute osseous injury of the sacrum and coccyx. 2. No evidence of metastatic disease of the sacrum and coccyx. 3. At L4-5 there is moderate bilateral facet arthropathy with ligamentum flavum thickening, left lateral recess stenosis, and mild central canal stenosis. Electronically Signed   By: Julaine Blanch M.D.   On: 08/23/2024 07:41   MR BREAST BILATERAL W WO CONTRAST INC CAD Result Date: 08/21/2024 CLINICAL DATA:  Staging exam. Patient had 2 recent biopsies of the right breast demonstrating  invasive ductal carcinoma and DCIS. EXAM: BILATERAL BREAST MRI WITH AND WITHOUT CONTRAST TECHNIQUE: Multiplanar, multisequence MR images of both breasts were obtained prior to and following the intravenous administration of 6 ml of Vueway Three-dimensional MR images were rendered by post-processing of the original MR data on an independent workstation. The three-dimensional MR images were interpreted, and findings are reported in the following complete MRI report for this study. Three dimensional images were evaluated at the independent interpreting  workstation using the DynaCAD thin client. COMPARISON:  None available. FINDINGS: Breast composition: d. Extreme fibroglandular tissue. Background parenchymal enhancement: Mild Right breast: At the site of biopsy-proven invasive carcinoma in the upper outer posterior right breast there is an irregular enhancing mass measuring 2.1 x 1.8 x 2.3 cm (series 11, image 55). In the upper central right breast there is a large area of suspicious clumped non mass enhancement measuring 6.3 cm in AP dimension (series 11, image 33). At the site of biopsy proven DCIS in the central right breast there is additional clumped non mass enhancement which extends anterior and inferior to the biopsy site overall spanning at least 2.9 cm and possibly up to 4.2 cm. There is additional non mass enhancement extending laterally which is likely related to the biopsy tract. Left breast: There is a small enhancing mass in the upper central posterior left breast measuring 0.8 cm (series 11, image 66). Lymph nodes: No abnormal appearing lymph nodes. Ancillary findings:  None. IMPRESSION: 1. Abnormal findings throughout much of the right breast involving the upper inner, upper outer and lower outer right breast. At the site of biopsy proven invasive malignancy in the upper outer right breast there is an enhancing mass measuring 2.3 cm. 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 There is a large area of suspicious clumped non mass enhancement in the upper central right breast measuring up to 6.3 cm. 2. Indeterminate small enhancing mass in the upper central left breast measuring 0.8 cm. RECOMMENDATION: 1. If breast conservation/neoadjuvant is being considered for the right breast recommend several additional MRI guided biopsies (x3) including the anterior and posterior extent of the suspicious non mass enhancement in the upper central right  breast (series 11, image 33), which has not been biopsied, as well as the more anterior portion of the non mass enhancement in the central right breast targeting the largest clumped area (series 11, image 78). 2.  MRI guided core needle biopsy x1 of the left breast. BI-RADS CATEGORY  4: Suspicious. Electronically Signed   By: Inocente Ast M.D.   On: 08/21/2024 10:44      IMPRESSION/PLAN:***    On date of service, in total, I spent *** minutes on this encounter. Patient was seen in person.   __________________________________________   Lauraine Golden, MD  This document serves as a record of services personally performed by Lauraine Golden, MD. It was created on her behalf by Dorthy Fuse, a trained medical scribe. The creation of this record is based on the scribe's personal observations and the provider's statements to them. This document has been checked and approved by the attending provider.

## 2024-09-04 ENCOUNTER — Other Ambulatory Visit: Payer: Self-pay | Admitting: Radiation Therapy

## 2024-09-04 ENCOUNTER — Ambulatory Visit
Admission: RE | Admit: 2024-09-04 | Discharge: 2024-09-04 | Disposition: A | Discharge status: Home / Self Care | Source: Ambulatory Visit | Attending: Radiation Oncology | Admitting: Radiation Oncology

## 2024-09-04 ENCOUNTER — Telehealth: Payer: Self-pay | Admitting: *Deleted

## 2024-09-04 ENCOUNTER — Ambulatory Visit: Admitting: Nurse Practitioner

## 2024-09-04 ENCOUNTER — Inpatient Hospital Stay

## 2024-09-04 ENCOUNTER — Other Ambulatory Visit: Payer: Self-pay | Admitting: *Deleted

## 2024-09-04 ENCOUNTER — Encounter: Payer: Self-pay | Admitting: Radiation Oncology

## 2024-09-04 ENCOUNTER — Telehealth: Payer: Self-pay | Admitting: Radiation Therapy

## 2024-09-04 VITALS — BP 165/85 | HR 94 | Temp 97.3°F | Resp 18 | Ht 66.0 in | Wt 124.0 lb

## 2024-09-04 DIAGNOSIS — Z801 Family history of malignant neoplasm of trachea, bronchus and lung: Secondary | ICD-10-CM | POA: Insufficient documentation

## 2024-09-04 DIAGNOSIS — C7931 Secondary malignant neoplasm of brain: Secondary | ICD-10-CM

## 2024-09-04 DIAGNOSIS — Z1721 Progesterone receptor positive status: Secondary | ICD-10-CM | POA: Diagnosis not present

## 2024-09-04 DIAGNOSIS — G8929 Other chronic pain: Secondary | ICD-10-CM | POA: Diagnosis not present

## 2024-09-04 DIAGNOSIS — M48061 Spinal stenosis, lumbar region without neurogenic claudication: Secondary | ICD-10-CM | POA: Insufficient documentation

## 2024-09-04 DIAGNOSIS — F17210 Nicotine dependence, cigarettes, uncomplicated: Secondary | ICD-10-CM | POA: Insufficient documentation

## 2024-09-04 DIAGNOSIS — C7951 Secondary malignant neoplasm of bone: Secondary | ICD-10-CM | POA: Insufficient documentation

## 2024-09-04 DIAGNOSIS — Z1732 Human epidermal growth factor receptor 2 negative status: Secondary | ICD-10-CM | POA: Insufficient documentation

## 2024-09-04 DIAGNOSIS — M47816 Spondylosis without myelopathy or radiculopathy, lumbar region: Secondary | ICD-10-CM | POA: Diagnosis not present

## 2024-09-04 DIAGNOSIS — M51379 Other intervertebral disc degeneration, lumbosacral region without mention of lumbar back pain or lower extremity pain: Secondary | ICD-10-CM | POA: Diagnosis not present

## 2024-09-04 DIAGNOSIS — C50411 Malignant neoplasm of upper-outer quadrant of right female breast: Secondary | ICD-10-CM | POA: Diagnosis not present

## 2024-09-04 DIAGNOSIS — Z79899 Other long term (current) drug therapy: Secondary | ICD-10-CM | POA: Diagnosis not present

## 2024-09-04 DIAGNOSIS — Z170 Estrogen receptor positive status [ER+]: Secondary | ICD-10-CM | POA: Diagnosis not present

## 2024-09-04 DIAGNOSIS — M5136 Other intervertebral disc degeneration, lumbar region with discogenic back pain only: Secondary | ICD-10-CM | POA: Insufficient documentation

## 2024-09-04 DIAGNOSIS — M47817 Spondylosis without myelopathy or radiculopathy, lumbosacral region: Secondary | ICD-10-CM | POA: Diagnosis not present

## 2024-09-04 DIAGNOSIS — I2510 Atherosclerotic heart disease of native coronary artery without angina pectoris: Secondary | ICD-10-CM | POA: Insufficient documentation

## 2024-09-04 DIAGNOSIS — C7800 Secondary malignant neoplasm of unspecified lung: Secondary | ICD-10-CM | POA: Diagnosis not present

## 2024-09-04 DIAGNOSIS — Z1501 Genetic susceptibility to malignant neoplasm of breast: Secondary | ICD-10-CM | POA: Diagnosis not present

## 2024-09-04 DIAGNOSIS — M4807 Spinal stenosis, lumbosacral region: Secondary | ICD-10-CM | POA: Diagnosis not present

## 2024-09-04 NOTE — Telephone Encounter (Signed)
 I spoke with the patient and her daughter today about the upcoming radiation treatment appointments. We reviewed her appointments and discussed also getting set up with neurosurgery for a consult. Once I hear back from [facility] with a confirmed treatment start date and consult time, I will call Emily Hayes back with that information.   They both expressed understanding and were happy for the call. They have my contact information for future questions or concerns.   Devere Perch R.T(R)(T) Radiation Special Procedures Lead

## 2024-09-04 NOTE — Progress Notes (Unsigned)
 [name], MD  [name] BURNETT SHAUNNA Ir Procedure Requests; Loretha Ash, MD Cc: Daralene Ferol FALCON, RT Dr. Loretha, best bx target is central LUL nodule which should be amenable to bronch bx as safest approach.  Rec referral to Pulm for bx eval.  Thanks Frederic Vanice       Previous Messages    ----- Message ----- From: [name] CROME Sent: 09/04/2024   8:58 AM EST To: Channing CROME Baldwin; Taryn F Rigney, RT; Ir Proc*  Procedure :CT Biopsy  Reason :Can we evaluate and see if she can have biopsy of lung nodule Dx: Malignant neoplasm of upper-outer quadrant of right breast in female, estrogen receptor positive (HCC) [C50.411, Z17.0 (ICD-10-CM)]; Lung nodule [R91.1 (ICD-10-CM)]; Metastasis to brain (HCC) [C79.31 (ICD-10-CM)]    History :MM CLIP PLACEMENT RIGHT,MM CLIP PLACEMENT LEFT,MR LT BREAST BX W LOC DEV 1ST LESION IMAGE BX SPEC MR GUIDE ,MR RT BREAST BX W LOC DEV EA ADD LESION IMAGE BX SPEC MR GUIDE,MR RT BREAST BX W LOC DEV EA ADD LESION IMAGE BX SPEC MR GUIDE,MR RT BREAST BX W LOC DEV 1ST LESION IMAGE BX SPEC MR GUIDE,MR THORACIC SPINE W WO CONTRAST,MR BRAIN W WO CONTRAST,MR CERVICAL SPINE W WO CONTRAST,NM PET Image Initial (PI) Skull Base To Thigh,MR Lumbar Spine W Wo Contrast ,MR SACRUM SI JOINTS W WO CONTRAST,MR BREAST BILATERAL W WO CONTRAST INC CAD,MM RT BREAST BX W LOC DEV 1ST LESION IMAGE BX SPEC STEREO GUIDE,US  RT BREAST BX W LOC DEV 1ST LESION IMG BX SPEC US  GUIDE,MM CLIP PLACEMENT RIGHT,US  LIMITED ULTRASOUND INCLUDING AXILLA RIGHT BREAST,MM 3D DIAGNOSTIC MAMMOGRAM UNILATERAL RIGHT BREAST,MM 3D SCREENING MAMMOGRAM BILATERAL BREAST ,DG Lumbar Spine Complete  Provider:Iruku, Ash, MD  Provider contact ; [phone]

## 2024-09-04 NOTE — Telephone Encounter (Signed)
 Left VM to inform patient that MR bx's will be cancelled for 11/19

## 2024-09-05 ENCOUNTER — Inpatient Hospital Stay: Admitting: Internal Medicine

## 2024-09-05 ENCOUNTER — Encounter: Payer: Self-pay | Admitting: *Deleted

## 2024-09-05 VITALS — BP 153/79 | HR 98 | Temp 98.0°F | Resp 16 | Ht 66.0 in | Wt 121.0 lb

## 2024-09-05 DIAGNOSIS — C679 Malignant neoplasm of bladder, unspecified: Secondary | ICD-10-CM | POA: Diagnosis not present

## 2024-09-05 DIAGNOSIS — C50919 Malignant neoplasm of unspecified site of unspecified female breast: Secondary | ICD-10-CM | POA: Diagnosis not present

## 2024-09-05 DIAGNOSIS — C7931 Secondary malignant neoplasm of brain: Secondary | ICD-10-CM

## 2024-09-05 DIAGNOSIS — G96198 Other disorders of meninges, not elsewhere classified: Secondary | ICD-10-CM | POA: Insufficient documentation

## 2024-09-05 MED ORDER — PREGABALIN 75 MG PO CAPS: 75.0000 mg | ORAL_CAPSULE | Freq: Two times a day (BID) | ORAL | 2 refills | Status: DC

## 2024-09-05 NOTE — Progress Notes (Signed)
 [facility] [address]  [place], [place] [address] [phone]   New Patient Evaluation  Date of Service: 09/05/24 Patient Name: Emily Hayes Patient MRN: [id number] Patient DOB: 1951/04/05 Provider: Arthea MARLA Manns, MD  Identifying Statement:  Emily Hayes is a 73 y.o. 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    Referring Provider: Izell Domino, MD [address] [place],  [place] [address]  Primary Cancer:  Oncologic History: Oncology History  Malignant neoplasm of upper-outer quadrant of right breast in female, estrogen receptor positive (HCC)  07/07/2024 Mammogram   Highly suspicious 1.9 cm irregular mass in the right breast at 10 o'clock, 6 cm the nipple, with associated mammographic architectural distortion. 2. Suspicious calcifications in the lateral and upper outer right breast spanning at least 5 cm from anterior to posterior. 3. Likely benign 1 mm group of calcifications in the medial right breast, only visualized discretely on the cc view.   07/26/2024 Pathology Results   1. Breast, right, needle core biopsy, 10:00 6 cmfn (clip heart) :      INVASIVE MODERATELY DIFFERENTIATED DUCTAL ADENOCARCINOMA, GRADE 2 (3+2+1)      TUBULE FORMATION: SCORE 3      NUCLEAR PLEOMORPHISM: SCORE 2      MITOTIC COUNT: SCORE 1      TOTAL SCORE: 6      OVERALL GRADE: GRADE 2 (6/9)      NEGATIVE FOR ANGIOLYMPHATIC INVASION      TUMOR MEASURES 13 MM IN GREATEST LINEAR EXTENT      ADJACENT FIBROCYSTIC CHANGES INCLUDING STROMAL FIBROSIS AND ADENOSIS      MICROCALCIFICATIONS PRESENT WITHIN BENIGN GNOSIS       2. Breast, right, needle core biopsy, calcifications, central, (clip x) :      FOCAL DUCTAL CARCINOMA IN  SITU, INTERMEDIATE NUCLEAR GRADE, CRIBRIFORM AND SOLID      TYPES WITHOUT NECROSIS      NEGATIVE FOR INVASIVE CARCINOMA      DCIS MEASURES 4 MM IN GREATEST LINEAR EXTENT      EXTENSIVE STROMAL FIBROSIS WITH FOCAL USUAL DUCT HYPERPLASIA      MICROCALCIFICATIONS PRESENT WITHIN DCIS AND USUAL DUCT HYPERPLASIA    07/28/2024 Initial Diagnosis   Malignant neoplasm of upper-outer quadrant of right breast in female, estrogen receptor positive (HCC)   08/02/2024 Cancer Staging   Staging form: Breast, AJCC 8th Edition - Clinical: Stage IA (cT1c, cN0, cM0, G2, ER+, PR-, HER2-) - Signed by Loretha Ash, MD on 08/02/2024 Histologic grading system: 3 grade system    Genetic Testing   Negative [facility] BRCAPlus + RNA (13 gene) panel. Report date 08/11/2024. 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 There is currently no evidence to suggest a significantly increased cancer risk for carriers of MUTYH pathogenic variants. Report date 08/14/2024.     History of Present Illness: The patient's records from the referring physician were obtained and reviewed and the patient interviewed to confirm this HPI.  Emily Hayes presents today to review recent CNS findings.  She describes several weeks history of midline low back pain, with radiating pain down the back of her left leg.  Overall burden of symptoms is progressive in nature.  Pain is more severe when she coughs or strains.  She  is able to walk normally, no weakness described.  Very little numbness as well.  Denies any focal weakness, speech difficulty, imbalance, seizures or headaches.  Continues to follow with Dr. Loretha for breast cancer treatments and workup.     Medications: Current Outpatient Medications on File Prior to Visit  Medication Sig Dispense Refill   meloxicam (MOBIC) 15 MG tablet Take 15 mg by mouth daily.     traMADol (ULTRAM) 50 MG tablet Take 50 mg by mouth every 6 (six) hours as  needed.     acetaminophen  (TYLENOL ) 500 MG tablet Take 500 mg by mouth daily as needed. (Patient not taking: Reported on 09/04/2024)     B Complex-Folic Acid (B COMPLEX-VITAMIN B12 PO) Take 1 tablet by mouth daily.     Calcium  Citrate-Vitamin D (CALCIUM  CITRATE + D PO) Take 1 tablet by mouth 2 (two) times daily.     diphenhydrAMINE (BENADRYL) 25 MG tablet Take 25 mg by mouth as needed.      losartan  (COZAAR ) 25 MG tablet Take 1 tablet (25 mg total) by mouth daily. 90 tablet 1   Magnesium Carbonate, Antacid, (MAGNESIUM CARBONATE PO) Take 240 mg by mouth 2 (two) times daily.     Misc Natural Products (COLON CLEANSE) CAPS Take by mouth as needed. As needed     rosuvastatin  (CRESTOR ) 40 MG tablet Take 1 tablet (40 mg total) by mouth daily. 90 tablet 1   Simethicone (GAS RELIEF EXTRA STRENGTH PO) Take 1 capsule by mouth as needed.     vitamin E 400 UNIT capsule Take 1,200 Units by mouth daily.      No current facility-administered medications on file prior to visit.    Allergies: No Known Allergies Past Medical History:  Past Medical History:  Diagnosis Date   Cervical radiculopathy    Family history of cancer    Family history of lung cancer    Fatigue    Generalized osteoarthrosis    HTN (hypertension)    Hyperlipidemia    Lateral epicondylitis of left elbow 06/28/2017   Left knee pain    Menopause    Neck pain    Ovarian cancer (HCC) 1983   Pneumonia    Pre-diabetes    Sciatica    Swelling of left knee joint    Past Surgical History:  Past Surgical History:  Procedure Laterality Date   ABCESS DRAINAGE Left 09/15/2017   behind tonsil   ABDOMINAL HYSTERECTOMY  1983   APPENDECTOMY     BREAST BIOPSY Right 07/26/2024   US  RT BREAST BX W LOC DEV 1ST LESION IMG BX SPEC US  GUIDE 07/26/2024 [facility]   BREAST BIOPSY Right 07/26/2024   MM RT BREAST BX W LOC DEV 1ST LESION IMAGE BX SPEC STEREO GUIDE 07/26/2024 [facility]   EXCISION METACARPAL MASS Right 12/29/2023    Procedure: EXCISION METACARPAL MASS;  Surgeon: Jerri Kay HERO, MD;  Location: [facility];  Service: Orthopedics;  Laterality: Right;  right thumb and long finger cyst removal   Social History:  Social History   Socioeconomic History   Marital status: Divorced    Spouse name: Not on file   Number of children: 2   Years of education: Not on file   Highest education level: Associate degree: academic program  Occupational History   Occupation: retired  Tobacco Use   Smoking status: Every Day    Current packs/day: 0.50    Average packs/day: 0.5 packs/day for 50.0 years (25.0 ttl pk-yrs)  Types: Cigarettes   Smokeless tobacco: Never  Vaping Use   Vaping status: Never Used  Substance and Sexual Activity   Alcohol use: No   Drug use: No   Sexual activity: Not Currently    Birth control/protection: Surgical    Comment: Hyst  Other Topics Concern   Not on file  Social History Narrative   Social History      Diet? none      Do you drink/eat things with caffeine? Chocolate- occasionally      Marital status?        divorced                            What year were you married? 1982      Do you live in a house, apartment, assisted living, condo, trailer, etc.? apartment      Is it one or more stories? 1      How many persons live in your home? 1       Do you have any pets in your home? (please list) yes, dog      Highest level of education completed? Associate degree- college      Current or past profession: retired      Do you exercise?           yes                           Type & how often? Walk- daily      Advanced Directives      Do you have a living will? yes      Do you have a DNR form?     yes                             If not, do you want to discuss one? yes      Do you have signed POA/HPOA for forms? no      Functional Status      Do you have difficulty bathing or dressing yourself?      Do you have difficulty preparing food or eating?        Do you have difficulty managing your medications?      Do you have difficulty managing your finances?      Do you have difficulty affording your medications?   Social Drivers of Corporate Investment Banker Strain: Low Risk  (05/29/2024)   Overall Financial Resource Strain (CARDIA)    Difficulty of Paying Living Expenses: Not hard at all  Food Insecurity: No Food Insecurity (09/01/2024)   Hunger Vital Sign    Worried About Running Out of Food in the Last Year: Never true    Ran Out of Food in the Last Year: Never true  Transportation Needs: No Transportation Needs (09/01/2024)   PRAPARE - Administrator, Civil Service (Medical): No    Lack of Transportation (Non-Medical): No  Physical Activity: Sufficiently Active (10/01/2023)   Exercise Vital Sign    Days of Exercise per Week: 7 days    Minutes of Exercise per Session: 40 min  Stress: No Stress Concern Present (10/01/2023)   [employer] of Occupational Health - Occupational Stress Questionnaire    Feeling of Stress : Not at all  Social Connections: Socially Isolated (05/29/2024)   Social Connection and Isolation Panel  Frequency of Communication with Friends and Family: Three times a week    Frequency of Social Gatherings with Friends and Family: Twice a week    Attends Religious Services: Patient declined    Database Administrator or Organizations: No    Attends Engineer, Structural: Not on file    Marital Status: Divorced  Intimate Partner Violence: Not At Risk (01/05/2018)   Humiliation, Afraid, Rape, and Kick questionnaire    Fear of Current or Ex-Partner: No    Emotionally Abused: No    Physically Abused: No    Sexually Abused: No   Family History:  Family History  Problem Relation Age of Onset   Stroke Mother    Diabetes Mother    Dementia Mother    Alzheimer's disease Mother    Cancer Sister        unknown   Cancer Sister        unknown   Cancer Sister        unknown    Cancer Sister        brain tumor   Colon polyps Brother    Hypertension Brother    Hyperlipidemia Brother    Kidney failure Brother    Lung cancer Brother    Colon cancer Neg Hx    Esophageal cancer Neg Hx    Rectal cancer Neg Hx    Stomach cancer Neg Hx     Review of Systems: Constitutional: Doesn't report fevers, chills or abnormal weight loss Eyes: Doesn't report blurriness of vision Ears, nose, mouth, throat, and face: Doesn't report sore throat Respiratory: Doesn't report cough, dyspnea or wheezes Cardiovascular: Doesn't report palpitation, chest discomfort  Gastrointestinal:  Doesn't report nausea, constipation, diarrhea GU: Doesn't report incontinence Skin: Doesn't report skin rashes Neurological: Per HPI Musculoskeletal: Doesn't report joint pain Behavioral/Psych: Doesn't report anxiety  Physical Exam: Vitals:   09/05/24 1418  BP: (!) 153/79  Pulse: 98  Resp: 16  Temp: 98 F (36.7 C)  SpO2: 98%   KPS: 80. General: Alert, cooperative, pleasant, in no acute distress Head: Normal EENT: No conjunctival injection or scleral icterus.  Lungs: Resp effort normal Cardiac: Regular rate Abdomen: Non-distended abdomen Skin: No rashes cyanosis or petechiae. Extremities: No clubbing or edema  Neurologic Exam: Mental Status: Awake, alert, attentive to examiner. Oriented to self and environment. Language is fluent with intact comprehension.  Cranial Nerves: Visual acuity is grossly normal. Visual fields are full. Extra-ocular movements intact. No ptosis. Face is symmetric Motor: Tone and bulk are normal. Power is full in both arms and legs. Reflexes are symmetric, no pathologic reflexes present.  Sensory: Intact to light touch Gait: Normal.   Labs: I have reviewed the data as listed    Component Value Date/Time   NA 140 08/02/2024 1255   NA 141 01/19/2017 0000   NA 141 01/19/2017 0000   K 4.3 08/02/2024 1255   CL 103 08/02/2024 1255   CO2 32 08/02/2024 1255    GLUCOSE 128 (H) 08/02/2024 1255   BUN 15 08/02/2024 1255   BUN 17 01/19/2017 0000   BUN 17 01/19/2017 0000   CREATININE 0.73 08/02/2024 1255   CREATININE 0.81 05/30/2024 0840   CALCIUM  10.1 08/02/2024 1255   PROT 7.0 08/02/2024 1255   ALBUMIN 4.5 08/02/2024 1255   AST 22 08/02/2024 1255   ALT 15 08/02/2024 1255   ALKPHOS 54 08/02/2024 1255   BILITOT 0.3 08/02/2024 1255   GFRNONAA >60 08/02/2024 1255   GFRNONAA 80 12/13/2020 0000  GFRAA 93 12/13/2020 0000   Lab Results  Component Value Date   WBC 6.6 08/02/2024   NEUTROABS 3.3 08/02/2024   HGB 11.2 (L) 08/02/2024   HCT 34.9 (L) 08/02/2024   MCV 83.9 08/02/2024   PLT 239 08/02/2024    Imaging:  MR THORACIC SPINE W WO CONTRAST Result Date: 08/31/2024 EXAM: MRI THORACIC SPINE WITH AND WITHOUT INTRAVENOUS CONTRAST 08/30/2024 07:53:07 PM TECHNIQUE: Multiplanar multisequence MRI of the thoracic spine was performed with and without the administration of intravenous contrast. COMPARISON: PET CT 08/29/2024. CLINICAL HISTORY: Metastatic disease evaluation. History of breast cancer with enhancing intradural masses in the lumbar spine. FINDINGS: BONES AND ALIGNMENT: Normal alignment. No fracture, suspicious marrow lesion, or significant marrow edema. No abnormal enhancement. SPINAL CORD: Normal spinal cord signal and morphology. No abnormal intradural enhancement. SOFT TISSUES: Bilateral lung nodules, more fully evaluated on the recent PET CT. 1 cm T2 hyperintense focus in the liver in segment 8, incompletely evaluated on this spine MRI though with low density and no appreciable abnormal FDG uptake on PET CT most compatible with a cyst. DEGENERATIVE CHANGES: Mild spondylosis with mild disc bulging in the lower thoracic spine. No significant stenosis. IMPRESSION: 1. No evidence of metastatic disease in the thoracic spine. Electronically signed by: Dasie Hamburg MD 08/31/2024 12:29 PM EST RP Workstation: [id number]   MR BRAIN W WO CONTRAST Result  Date: 08/31/2024 EXAM: MRI BRAIN WITH AND WITHOUT CONTRAST 08/30/2024 07:52:52 PM TECHNIQUE: Multiplanar multisequence MRI of the head/brain was performed with and without the administration of intravenous contrast. CONTRAST: 5.5 mL Gadobutrol. COMPARISON: None available. CLINICAL HISTORY: Metastatic disease evaluation. 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 There are extra-axial collections over the bilateral parietal lobes which measure up to 1.3 cm in thickness on the right and 0.9 cm on the left, suggestive of subdural hygromas versus chronic subdural hematomas. There is associated mass effect on the parietal parenchyma. No midline shift. No acute intracranial hemorrhage. No hydrocephalus. The sella is unremarkable. Normal flow voids. There is a 3.1 x 1.8 x 2.2 cm lobulated enhancing mass in the medial and slightly posterior aspect of the right temporal lobe involving the periventricular white matter and also involving the body of the right hippocampus. There is associated vasogenic edema throughout the right temporal lobe. Additional FLAIR hyperintensity and thin curvilinear enhancement along the margin of the posterior right temporal horn and atrium of the left lateral ventricle concerning for ependymal involvement of the metastatic lesion versus reactive changes. There is an additional 2.2 x 1.9 x 1.7 cm peripherally enhancing lesion in the anterior left temporal lobe with associated vasogenic edema. There is a 0.9 x 0.6 x 0.4 cm lesion within the inferior aspect of the left temporooccipital lobes seen on series 16 image 22 and series 17 image 9. There is a 1.0 x 0.6 x 0.4 cm lesion within the inferior aspect of the left temporooccipital lobes seen on series 16 image 22 and series 17 image 9. There is a 1.0 x 0.8 x 0.9 cm enhancing lesion in the superior left frontal lobe involving the left superior frontal gyrus seen on series 16 image 49 and series 17 image 18.  There is a small focus of enhancement involving the right lentiform nucleus which could reflect an additional lesion seen on series 16 image 31. Minimal supratentorial white matter signal abnormality suggestive of chronic microvascular ischemic changes. ORBITS: No acute abnormality. SINUSES: No acute abnormality. BONES AND SOFT TISSUES: Normal bone marrow signal and  enhancement. No acute soft tissue abnormality. IMPRESSION: 1. Multiple enhancing intracranial lesions consistent with metastatic disease, including a dominant 3.1 x 1.8 x 2.2 cm right temporal lobe mass with suspected ependymal involvement creating potential from drop metastasis. Additional 2.2 cm anterior left temporal lobe lesion, 1.0 cm left superior frontal gyrus lesion, and small lesions in the left temporooccipital region and right lentiform nucleus. 2. Bilateral extra-axial parietal collections suggestive of subdural hygromas versus chronic subdural hematomas with associated parietal mass effect. No midline shift. 3. 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 No acute infarct. Electronically signed by: Donnice Mania MD 08/31/2024 12:21 PM EST RP Workstation: [id number]   MR CERVICAL SPINE W WO CONTRAST Result Date: 08/31/2024 EXAM: MRI CERVICAL SPINE WITH AND WITHOUT CONTRAST 08/30/2024 07:52:26 PM TECHNIQUE: Multiplanar multisequence MRI of the cervical spine was performed without and with the administration of 5.5 mL of gadobutrol (GADAVIST) 1 MMOL/ML injection. COMPARISON: PET CT 08/29/2024. CLINICAL HISTORY: Metastatic disease evaluation. History of breast cancer with enhancing intradural masses in the lumbar spine. FINDINGS: BONES AND ALIGNMENT: Cervical spine straightening. Trace anterolisthesis of C3 on C4 and C4 on C5. No fracture, suspicious marrow lesion, or significant marrow edema. No abnormal enhancement. SPINAL CORD: Normal spinal cord signal and morphology. No abnormal intradural enhancement. SOFT TISSUES: No paraspinal  mass. DISC LEVELS: Mild cervical spondylosis with minor multilevel disc bulging and preserved disc heights. Focally severe right facet arthrosis at C3-C4 with prominent sclerosis and resultant moderate right neural foraminal stenosis. No spinal stenosis. IMPRESSION: 1. No evidence of metastatic disease in the cervical spine. 2. Mild cervical spondylosis without spinal stenosis. 3. Severe right facet arthrosis at C3-4. Electronically signed by: Dasie Hamburg MD 08/31/2024 12:19 PM EST RP Workstation: [id number]   NM PET Image Initial (PI) Skull Base To Thigh Result Date: 08/30/2024 EXAM: PET AND CT SKULL BASE TO MID THIGH 08/29/2024 05:15:09 PM TECHNIQUE: RADIOPHARMACEUTICAL: 5.98 mCi F-18 FDG Uptake time 60 minutes. Glucose level 142 mg/dl. PET imaging was acquired from the base of the skull to the mid thighs. Non-contrast enhanced computed tomography was obtained for attenuation correction and anatomic localization. COMPARISON: MRI lumbar spine 08/21/2024. CLINICAL HISTORY: Breast cancer, invasive, stage I/II/III, initial workup; new breast cancer/recent MRI's concerning for metastatic disease. FINDINGS: HEAD AND NECK: No metabolically active cervical lymphadenopathy. CHEST: Within the left upper lobe, a nodule measuring 15 mm on image 75 demonstrates hypermetabolic activity with an SUV max of 6.6. Hypermetabolic left hilar activity on image 71 suggests a metastatic hilar node. Within the right upper lobe, an irregular nodule measuring 18 mm on image 66 has mild metabolic activity with an SUV max of 2.7. ABDOMEN AND PELVIS: No metabolically active lymphadenopathy. Physiologic activity within the gastrointestinal and genitourinary systems. BONES AND SOFT TISSUE: A focus of intense metabolic activity is present within the spinal canal posterior to the L2 vertebral body. This lesion corresponds to a lesion on the comparison MRI. Smaller lesions within the canal at L5 also correspond to MRI metastatic lesions. These  lesions are difficult to place on the CT portion of the exam. Within the posterior right breast, a solid nodule measuring 15 mm demonstrates hypermetabolic activity on image 72. A biopsy clip is associated with this right breast mass. No musculoskeletal metastases. IMPRESSION: 1. Hypermetabolic right breast mass with biopsy clip, consistent with primary breast carcinoma. 2. Hypermetabolic left upper lobe nodule and hypermetabolic left hilar node, consistent with pulmonary metastases. 3. Mildly hypermetabolic right upper lobe nodule, consistent with pulmonary metastasis. 4. Hypermetabolic spinal canal  lesions at L2 and L5, consistent with spinal metastases. Electronically signed by: Norleen Boxer MD 08/30/2024 09:56 AM EST RP Workstation: [id number]   MR Lumbar Spine W Wo Contrast Result Date: 08/25/2024 EXAM: MRI LUMBAR SPINE 08/21/2024 11:02:57 AM TECHNIQUE: Multiplanar multisequence MRI of the lumbar spine was performed without and with the administration of intravenous contrast. CONTRAST: 10 mL of Vueway. COMPARISON: Lumbar spine series dated 06/05/2024. CLINICAL HISTORY: Breast cancer, invasive, initial workup, rule out malignancy, and back pain. FINDINGS: BONES AND ALIGNMENT: Normal alignment. Normal vertebral body heights. Bone marrow signal is unremarkable. SPINAL CORD: The conus medullaris terminates at T12-L1. The lower spinal cord appears normal. SOFT TISSUES: No paraspinal mass. L1-L2: No significant disc herniation. No spinal canal stenosis or neural foraminal narrowing. L2-L3: There is a focal area of nodular enhancement within the thecal sac, concerning for drop metastases. This lesion extends approximately 2 cm in craniocaudad length. There is mild diffuse disc bulging, causing mild central spinal canal stenosis. No neural foraminal narrowing. L3-L4: There is a focal area of nodular enhancement within the thecal sac, concerning for drop metastases. No significant disc herniation. No spinal canal  stenosis or neural foraminal narrowing. L4-L5: There is a focal area of nodular enhancement within the thecal sac, concerning for drop metastases. There is mild diffuse disc bulging and facet hypertrophy, causing mild-to-moderate central spinal canal stenosis and bilateral lateral recess stenosis. No neural foraminal narrowing. L5-S1: There is mild bilateral facet arthrosis. No significant disc herniation. No spinal canal stenosis or neural foraminal narrowing. IMPRESSION: 1. Three intradural, nodular enhancing lesions at L2-3, L3-4, and L4-5, most concerning for drop metastases in the setting of breast cancer; largest at L2-3 measuring approximately 2 cm craniocaudally. Recommend neuro-oncology consultation and contrast-enhanced MRI of the entire neuraxis (brain and full spine) for staging. 2. Mild diffuse disc bulge and facet hypertrophy at L4-5 causing mild-to-moderate central canal stenosis and bilateral lateral recess stenosis. 3. Mild diffuse disc bulge at L2-3 causing mild central canal stenosis. 4. Mild bilateral facet arthrosis at L5-S1. Electronically signed by: Evalene Coho MD 08/25/2024 10:00 AM EST RP Workstation: [id number]   MR SACRUM SI JOINTS W WO CONTRAST Result Date: 08/23/2024 CLINICAL DATA:  History of breast cancer.  Back pain. EXAM: MRI LUMBAR SPINE WITHOUT AND WITH CONTRAST TECHNIQUE: Multiplanar and multiecho pulse sequences of the lumbar spine were obtained without and with intravenous contrast. CONTRAST:  5.5 mL Vueway, 3 mL wasted COMPARISON:  None Available. FINDINGS: Bones/Joint/Cartilage No fracture or dislocation. Normal alignment. No joint effusion. No marrow signal abnormality. No SI joint widening or erosive changes. No subchondral reactive marrow changes. No SI joint effusion. Minimal grade 1 anterolisthesis of L4 on L5 secondary to mild facet arthropathy. At L4-5 there is moderate bilateral facet arthropathy with ligamentum flavum thickening, left lateral recess stenosis,  and mild central canal stenosis. Ligaments, Muscles and Tendons Muscles are normal. No muscle atrophy. No muscle edema. Piriformis muscles are normal bilaterally without signal abnormality. Soft tissue No fluid collection or hematoma. No soft tissue mass. Normal neurovascular bundles. IMPRESSION: 1. No acute osseous injury of the sacrum and coccyx. 2. No evidence of metastatic disease of the sacrum and coccyx. 3. At L4-5 there is moderate bilateral facet arthropathy with ligamentum flavum thickening, left lateral recess stenosis, and mild central canal stenosis. Electronically Signed   By: Julaine Blanch M.D.   On: 08/23/2024 07:41   MR BREAST BILATERAL W WO CONTRAST INC CAD Result Date: 08/21/2024 CLINICAL DATA:  Staging exam. Patient had 2 recent  biopsies of the right breast demonstrating invasive ductal carcinoma and DCIS. EXAM: BILATERAL BREAST MRI WITH AND WITHOUT CONTRAST TECHNIQUE: Multiplanar, multisequence MR images of both breasts were obtained prior to and following the intravenous administration of 6 ml of Vueway Three-dimensional MR images were rendered by post-processing of the original MR data on an independent workstation. The three-dimensional MR images were interpreted, and findings are reported in the following complete MRI report for this study. Three dimensional images were evaluated at the independent interpreting workstation using the DynaCAD thin client. COMPARISON:  None available. FINDINGS: Breast composition: d. Extreme fibroglandular tissue. Background parenchymal enhancement: Mild Right breast: At the site of biopsy-proven invasive carcinoma in the upper outer posterior right breast there is an irregular enhancing mass measuring 2.1 x 1.8 x 2.3 cm (series 11, image 55). In the upper central right breast there is a large area of suspicious clumped non mass enhancement measuring 6.3 cm in AP dimension (series 11, image 33). At the site of biopsy proven DCIS in the central right breast  there is additional clumped non mass enhancement which extends anterior and inferior to the biopsy site overall spanning at least 2.9 cm and possibly up to 4.2 cm. There is additional non mass enhancement extending laterally which is likely related to the biopsy tract. Left breast: There is a small enhancing mass in the upper central posterior left breast measuring 0.8 cm (series 11, image 66). Lymph nodes: No abnormal appearing lymph nodes. Ancillary findings:  None. IMPRESSION: 1. Abnormal findings throughout much of the right breast involving the upper inner, upper outer and lower outer right breast. At the site of biopsy proven invasive malignancy in the upper outer right breast there is an enhancing mass measuring 2.3 cm. 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 There is a large area of suspicious clumped non mass enhancement in the upper central right breast measuring up to 6.3 cm. 2. Indeterminate small enhancing mass in the upper central left breast measuring 0.8 cm. RECOMMENDATION: 1. If breast conservation/neoadjuvant is being considered for the right breast recommend several additional MRI guided biopsies (x3) including the anterior and posterior extent of the suspicious non mass enhancement in the upper central right breast (series 11, image 33), which has not been biopsied, as well as the more anterior portion of the non mass enhancement in the central right breast targeting the largest clumped area (series 11, image 78). 2.  MRI guided core needle biopsy x1 of the left breast. BI-RADS CATEGORY  4: Suspicious. Electronically Signed   By: Inocente Ast M.D.   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  CNS staging demonstrated 3 nodular lesions within L2-L4 nerve roots, as well as larger metastases within left anterior temporal lobe and right mesial temporal lobe.  The brain lesions are not currently causing focal deficits at this time.  Case was discussed in detail in CNS tumor board this week as well as last week.  Recommended localized radiosurgery to both lumbosacral and brain regions.  These treatments have the potential to be both palliative and disease modifying.  She understands that LMD treatments  are not curative, and for some patients radiation is not able to control progression and extent of disease burden.  She is currently dosing Tramodol for her neuropathic pain, after gabapentin  was ineffective.  Recommended trial of Lyrica 75mg  BID; she may continue the Tramadol or discontinue this as it has caused some nausea.  She should remain off the gabapentin .  We will follow up with her towards the end of her radiation treatments to continue to address neuropathic pain and medications.  We are hopeful that the radiation itself will help modulate the symptoms.    We spent twenty additional minutes teaching regarding the natural history, biology, and historical experience in the treatment of neurologic complications of cancer.   We appreciate the opportunity to participate in the care of Emily Hayes.  We will see her next month for med adjusetments, and in 2-3 months following radiation with L-spine and 3T brain studies for review.    All questions were answered. The patient knows to call the clinic with any problems, questions or concerns. No barriers to learning were detected.  The total time spent in the encounter was 40 minutes and more than 50% was on counseling and review of test results   Arthea MARLA Manns, MD Medical Director of  Neuro-Oncology [facility] 09/05/24 2:19 PM

## 2024-09-06 ENCOUNTER — Encounter

## 2024-09-06 ENCOUNTER — Other Ambulatory Visit

## 2024-09-06 ENCOUNTER — Ambulatory Visit (HOSPITAL_COMMUNITY)
Admission: RE | Admit: 2024-09-06 | Discharge: 2024-09-06 | Disposition: A | Discharge status: Home / Self Care | Source: Ambulatory Visit | Attending: Radiation Oncology | Admitting: Radiation Oncology

## 2024-09-06 ENCOUNTER — Telehealth: Payer: Self-pay | Admitting: Internal Medicine

## 2024-09-06 DIAGNOSIS — C7931 Secondary malignant neoplasm of brain: Secondary | ICD-10-CM | POA: Insufficient documentation

## 2024-09-06 MED ORDER — GADOBUTROL 1 MMOL/ML IV SOLN
5.5000 mL | Freq: Once | INTRAVENOUS | Status: AC | PRN
  Administered 2024-09-06: 5.5 mL via INTRAVENOUS

## 2024-09-06 NOTE — Progress Notes (Unsigned)
 Emily Ash, MD  [name], RN; [name], MD; [name] BURNETT SHAUNNA Ir Procedure Requests; Shelah Lamar RAMAN, MD Cc: Daralene Ferol FALCON, RT Thanks Dr Shelah, we would really appreciate ur help.       Previous Messages    ----- Message ----- From: [name], RN Sent: 09/05/2024   4:58 PM EST To: Sharper Vanice, MD; Lamar RAMAN Shelah, MD; Harl*  I already placed referral and looks like she was scheduled for 12/4 to see Dr. Shelah.  Dr.Byrum - is it possible to get her scheduled sooner?  Thanks, Nanetta ----- Message ----- From: Emily Ash, MD Sent: 09/05/2024   4:55 PM EST To: Sharper Vanice, MD; Nanetta SAILOR Tyree, RN; Koreen Nanetta  Can we request a pulmonary consult and get this biopsied please?  Thanks, ----- Message ----- From: [name], MD Sent: 09/04/2024   9:32 AM EST To: Channing LITTIE Baldwin; Ferol FALCON Daralene, RT; Praveen*  Dr. Loretha, best bx target is central LUL nodule which should be amenable to bronch bx as safest approach.  Rec referral to Pulm for bx eval.  Thanks Frederic Vanice ----- Message ----- From: [name] LITTIE Sent: 09/04/2024   8:58 AM EST To: Channing LITTIE Baldwin; Taryn F Rigney, RT; Ir Proc*  Procedure :CT Biopsy  Reason :Can we evaluate and see if she can have biopsy of lung nodule Dx: Malignant neoplasm of upper-outer quadrant of right breast in female, estrogen receptor positive (HCC) [C50.411, Z17.0 (ICD-10-CM)]; Lung nodule [R91.1 (ICD-10-CM)]; Metastasis to brain (HCC) [C79.31 (ICD-10-CM)]    History :MM CLIP PLACEMENT RIGHT,MM CLIP PLACEMENT LEFT,MR LT BREAST BX W LOC DEV 1ST LESION IMAGE BX SPEC MR GUIDE ,MR RT BREAST BX W LOC DEV EA ADD LESION IMAGE BX SPEC MR GUIDE,MR RT BREAST BX W LOC DEV EA ADD LESION IMAGE BX SPEC MR GUIDE,MR RT BREAST BX W LOC DEV 1ST LESION IMAGE BX SPEC MR GUIDE,MR THORACIC SPINE W WO CONTRAST,MR BRAIN W WO CONTRAST,MR CERVICAL SPINE W WO CONTRAST,NM PET Image Initial (PI) Skull Base To Thigh,MR  Lumbar Spine W Wo Contrast ,MR SACRUM SI JOINTS W WO CONTRAST,MR BREAST BILATERAL W WO CONTRAST INC CAD,MM RT BREAST BX W LOC DEV 1ST LESION IMAGE BX SPEC STEREO GUIDE,US  RT BREAST BX W LOC DEV 1ST LESION IMG BX SPEC US  GUIDE,MM CLIP PLACEMENT RIGHT,US  LIMITED ULTRASOUND INCLUDING AXILLA RIGHT BREAST,MM 3D DIAGNOSTIC MAMMOGRAM UNILATERAL RIGHT BREAST,MM 3D SCREENING MAMMOGRAM BILATERAL BREAST ,DG Lumbar Spine Complete  Provider:Iruku, Ash, MD  Provider contact ; [phone]

## 2024-09-06 NOTE — Progress Notes (Unsigned)
 Loretha Ash, MD  [name], MD; [name] BURNETT SHAUNNA Ir Procedure Requests; [name], RN Cc: Daralene Ferol FALCON, RT Nanetta  Can we request a pulmonary consult and get this biopsied please?  Thanks,       Previous Messages    ----- Message ----- From: [name], MD Sent: 09/04/2024   9:32 AM EST To: Channing LITTIE Baldwin; Ferol FALCON Daralene, RT; Praveen*  Dr. Loretha, best bx target is central LUL nodule which should be amenable to bronch bx as safest approach.  Rec referral to Pulm for bx eval.  Thanks Frederic Vanice ----- Message ----- From: [name] LITTIE Sent: 09/04/2024   8:58 AM EST To: Channing LITTIE Baldwin; Taryn F Rigney, RT; Ir Proc*  Procedure :CT Biopsy  Reason :Can we evaluate and see if she can have biopsy of lung nodule Dx: Malignant neoplasm of upper-outer quadrant of right breast in female, estrogen receptor positive (HCC) [C50.411, Z17.0 (ICD-10-CM)]; Lung nodule [R91.1 (ICD-10-CM)]; Metastasis to brain (HCC) [C79.31 (ICD-10-CM)]    History :MM CLIP PLACEMENT RIGHT,MM CLIP PLACEMENT LEFT,MR LT BREAST BX W LOC DEV 1ST LESION IMAGE BX SPEC MR GUIDE ,MR RT BREAST BX W LOC DEV EA ADD LESION IMAGE BX SPEC MR GUIDE,MR RT BREAST BX W LOC DEV EA ADD LESION IMAGE BX SPEC MR GUIDE,MR RT BREAST BX W LOC DEV 1ST LESION IMAGE BX SPEC MR GUIDE,MR THORACIC SPINE W WO CONTRAST,MR BRAIN W WO CONTRAST,MR CERVICAL SPINE W WO CONTRAST,NM PET Image Initial (PI) Skull Base To Thigh,MR Lumbar Spine W Wo Contrast ,MR SACRUM SI JOINTS W WO CONTRAST,MR BREAST BILATERAL W WO CONTRAST INC CAD,MM RT BREAST BX W LOC DEV 1ST LESION IMAGE BX SPEC STEREO GUIDE,US  RT BREAST BX W LOC DEV 1ST LESION IMG BX SPEC US  GUIDE,MM CLIP PLACEMENT RIGHT,US  LIMITED ULTRASOUND INCLUDING AXILLA RIGHT BREAST,MM 3D DIAGNOSTIC MAMMOGRAM UNILATERAL RIGHT BREAST,MM 3D SCREENING MAMMOGRAM BILATERAL BREAST ,DG Lumbar Spine Complete  Provider:Iruku, Ash, MD  Provider contact ; [phone]

## 2024-09-06 NOTE — Telephone Encounter (Signed)
 Scheduled patient for next appointments. Tried to call the patient but it would not go through.

## 2024-09-06 NOTE — Progress Notes (Unsigned)
 [name], RN  Loretha Ash, MD; [name], MD; [name] BURNETT SHAUNNA Ir Procedure Requests; Shelah Lamar RAMAN, MD Cc: Daralene Ferol FALCON, RT I already placed referral and looks like she was scheduled for 12/4 to see Dr. Shelah.  Dr.Byrum - is it possible to get her scheduled sooner?  Thanks, Nanetta       Previous Messages    ----- Message ----- From: Loretha Ash, MD Sent: 09/05/2024   4:55 PM EST To: Sharper Vanice, MD; Nanetta SAILOR Tyree, RN; Koreen Nanetta  Can we request a pulmonary consult and get this biopsied please?  Thanks, ----- Message ----- From: [name], MD Sent: 09/04/2024   9:32 AM EST To: Channing LITTIE Baldwin; Ferol FALCON Daralene, RT; Praveen*  Dr. Loretha, best bx target is central LUL nodule which should be amenable to bronch bx as safest approach.  Rec referral to Pulm for bx eval.  Thanks Frederic Vanice ----- Message ----- From: [name] LITTIE Sent: 09/04/2024   8:58 AM EST To: Channing LITTIE Baldwin; Taryn F Rigney, RT; Ir Proc*  Procedure :CT Biopsy  Reason :Can we evaluate and see if she can have biopsy of lung nodule Dx: Malignant neoplasm of upper-outer quadrant of right breast in female, estrogen receptor positive (HCC) [C50.411, Z17.0 (ICD-10-CM)]; Lung nodule [R91.1 (ICD-10-CM)]; Metastasis to brain (HCC) [C79.31 (ICD-10-CM)]    History :MM CLIP PLACEMENT RIGHT,MM CLIP PLACEMENT LEFT,MR LT BREAST BX W LOC DEV 1ST LESION IMAGE BX SPEC MR GUIDE ,MR RT BREAST BX W LOC DEV EA ADD LESION IMAGE BX SPEC MR GUIDE,MR RT BREAST BX W LOC DEV EA ADD LESION IMAGE BX SPEC MR GUIDE,MR RT BREAST BX W LOC DEV 1ST LESION IMAGE BX SPEC MR GUIDE,MR THORACIC SPINE W WO CONTRAST,MR BRAIN W WO CONTRAST,MR CERVICAL SPINE W WO CONTRAST,NM PET Image Initial (PI) Skull Base To Thigh,MR Lumbar Spine W Wo Contrast ,MR SACRUM SI JOINTS W WO CONTRAST,MR BREAST BILATERAL W WO CONTRAST INC CAD,MM RT BREAST BX W LOC DEV 1ST LESION IMAGE BX SPEC STEREO GUIDE,US  RT BREAST BX W LOC  DEV 1ST LESION IMG BX SPEC US  GUIDE,MM CLIP PLACEMENT RIGHT,US  LIMITED ULTRASOUND INCLUDING AXILLA RIGHT BREAST,MM 3D DIAGNOSTIC MAMMOGRAM UNILATERAL RIGHT BREAST,MM 3D SCREENING MAMMOGRAM BILATERAL BREAST ,DG Lumbar Spine Complete  Provider:Iruku, Ash, MD  Provider contact ; [phone]

## 2024-09-06 NOTE — Progress Notes (Unsigned)
 Emily Nanetta SAILOR, RN  Loretha Ash, MD; [name], MD; [name] BURNETT SHAUNNA Ir Procedure Requests; Shelah Lamar RAMAN, MD Cc: Daralene Ferol FALCON, RT She's been moved up to 11/25.       Previous Messages    ----- Message ----- From: Loretha Ash, MD Sent: 09/05/2024   9:28 PM EST To: Sharper Vanice, MD; Nanetta Hayes Tyree, RN; Rob*  Thanks Dr Shelah, we would really appreciate ur help. ----- Message ----- From: Emily Nanetta SAILOR, RN Sent: 09/05/2024   4:58 PM EST To: Sharper Vanice, MD; Lamar RAMAN Shelah, MD; Harl*  I already placed referral and looks like she was scheduled for 12/4 to see Dr. Shelah.  Dr.Byrum - is it possible to get her scheduled sooner?  Thanks, Nanetta ----- Message ----- From: Loretha Ash, MD Sent: 09/05/2024   4:55 PM EST To: Sharper Vanice, MD; Nanetta Hayes Tyree, RN; Koreen Nanetta  Can we request a pulmonary consult and get this biopsied please?  Thanks, ----- Message ----- From: [name], MD Sent: 09/04/2024   9:32 AM EST To: Channing LITTIE Baldwin; Ferol FALCON Daralene, RT; Praveen*  Dr. Loretha, best bx target is central LUL nodule which should be amenable to bronch bx as safest approach.  Rec referral to Pulm for bx eval.  Thanks Frederic Vanice ----- Message ----- From: [name] LITTIE Sent: 09/04/2024   8:58 AM EST To: Channing LITTIE Baldwin; Taryn F Rigney, RT; Ir Proc*  Procedure :CT Biopsy  Reason :Can we evaluate and see if she can have biopsy of lung nodule Dx: Malignant neoplasm of upper-outer quadrant of right breast in female, estrogen receptor positive (HCC) [C50.411, Z17.0 (ICD-10-CM)]; Lung nodule [R91.1 (ICD-10-CM)]; Metastasis to brain (HCC) [C79.31 (ICD-10-CM)]    History :MM CLIP PLACEMENT RIGHT,MM CLIP PLACEMENT LEFT,MR LT BREAST BX W LOC DEV 1ST LESION IMAGE BX SPEC MR GUIDE ,MR RT BREAST BX W LOC DEV EA ADD LESION IMAGE BX SPEC MR GUIDE,MR RT BREAST BX W LOC DEV EA ADD LESION IMAGE BX SPEC MR GUIDE,MR RT BREAST BX W LOC DEV 1ST  LESION IMAGE BX SPEC MR GUIDE,MR THORACIC SPINE W WO CONTRAST,MR BRAIN W WO CONTRAST,MR CERVICAL SPINE W WO CONTRAST,NM PET Image Initial (PI) Skull Base To Thigh,MR Lumbar Spine W Wo Contrast ,MR SACRUM SI JOINTS W WO CONTRAST,MR BREAST BILATERAL W WO CONTRAST INC CAD,MM RT BREAST BX W LOC DEV 1ST LESION IMAGE BX SPEC STEREO GUIDE,US  RT BREAST BX W LOC DEV 1ST LESION IMG BX SPEC US  GUIDE,MM CLIP PLACEMENT RIGHT,US  LIMITED ULTRASOUND INCLUDING AXILLA RIGHT BREAST,MM 3D DIAGNOSTIC MAMMOGRAM UNILATERAL RIGHT BREAST,MM 3D SCREENING MAMMOGRAM BILATERAL BREAST ,DG Lumbar Spine Complete  Provider:Iruku, Ash, MD  Provider contact ; [phone]

## 2024-09-07 ENCOUNTER — Ambulatory Visit
Admission: RE | Admit: 2024-09-07 | Discharge: 2024-09-07 | Attending: Radiation Oncology | Admitting: Radiation Oncology

## 2024-09-07 ENCOUNTER — Ambulatory Visit: Admission: RE | Admit: 2024-09-07 | Source: Ambulatory Visit

## 2024-09-07 ENCOUNTER — Telehealth: Payer: Self-pay | Admitting: *Deleted

## 2024-09-07 ENCOUNTER — Encounter: Admitting: Internal Medicine

## 2024-09-07 ENCOUNTER — Ambulatory Visit
Admission: RE | Admit: 2024-09-07 | Discharge: 2024-09-07 | Disposition: A | Discharge status: Home / Self Care | Source: Ambulatory Visit | Attending: Radiation Oncology | Admitting: Radiation Oncology

## 2024-09-07 ENCOUNTER — Ambulatory Visit

## 2024-09-07 ENCOUNTER — Other Ambulatory Visit: Payer: Self-pay | Admitting: Radiation Therapy

## 2024-09-07 DIAGNOSIS — C50411 Malignant neoplasm of upper-outer quadrant of right female breast: Secondary | ICD-10-CM | POA: Diagnosis present

## 2024-09-07 DIAGNOSIS — C7951 Secondary malignant neoplasm of bone: Secondary | ICD-10-CM | POA: Diagnosis not present

## 2024-09-07 DIAGNOSIS — C7931 Secondary malignant neoplasm of brain: Secondary | ICD-10-CM | POA: Insufficient documentation

## 2024-09-07 DIAGNOSIS — Z510 Encounter for antineoplastic radiation therapy: Secondary | ICD-10-CM | POA: Insufficient documentation

## 2024-09-07 NOTE — Telephone Encounter (Signed)
 PC received from patient -  she took her first dose of Lyrica last night, she was able to sleep much better, her pain in her L leg & hip was much improved & her pain is less today as well.  However, she said when she got up this morning, she felt very shaky, dizzy, felt out of control, something felt off.  These side effects subsided after she ate breakfast but she did not take a morning dose because of the way she felt.  She would prefer to take the Lyrica only at bedtime if Dr Buckley is agreeable.    Routed to Dr Buckley - please advise if you have other recommendations.

## 2024-09-11 ENCOUNTER — Other Ambulatory Visit: Payer: Self-pay

## 2024-09-11 ENCOUNTER — Telehealth: Payer: Self-pay

## 2024-09-11 ENCOUNTER — Ambulatory Visit

## 2024-09-11 VITALS — BP 130/80 | HR 91 | Temp 98.5°F | Ht 66.0 in | Wt 121.0 lb

## 2024-09-11 DIAGNOSIS — C7951 Secondary malignant neoplasm of bone: Secondary | ICD-10-CM | POA: Diagnosis not present

## 2024-09-11 DIAGNOSIS — F17200 Nicotine dependence, unspecified, uncomplicated: Secondary | ICD-10-CM

## 2024-09-11 DIAGNOSIS — F17210 Nicotine dependence, cigarettes, uncomplicated: Secondary | ICD-10-CM

## 2024-09-11 DIAGNOSIS — C50919 Malignant neoplasm of unspecified site of unspecified female breast: Secondary | ICD-10-CM

## 2024-09-11 DIAGNOSIS — C7931 Secondary malignant neoplasm of brain: Secondary | ICD-10-CM

## 2024-09-11 DIAGNOSIS — R918 Other nonspecific abnormal finding of lung field: Secondary | ICD-10-CM | POA: Insufficient documentation

## 2024-09-11 NOTE — Patient Instructions (Signed)
 It was a pleasure to see you today. Work on quitting smoking altogether You will be alerted of bronchoscopy date and time Don't eat anything after midnight on the date of bronchoscopy

## 2024-09-11 NOTE — Assessment & Plan Note (Signed)
  Orders:   CT SUPER D CHEST WO CONTRAST; Future

## 2024-09-11 NOTE — Telephone Encounter (Signed)
 Please schedule the following:  Provider performing procedure:Dr BYRUM (or others) Diagnosis: lung nodules, likely metastatic Which side if for nodule / mass? Right and left Procedure: navigational bronchoscopy  Has patient been spoken to by Provider and given informed consent? yes Anesthesia: GA Do you need Fluro? yes Duration of procedure: 1-.15 hr Date: next available after Dec 12 Alternate Date: if earlier dates available, please reach out to Dr Pleas first- need to co-ordinate with her radiation doctors  Time: AM/ PM Location: [facility] Does patient have OSA? no DM? no Or Latex allergy? no Medication Restriction/ Anticoagulate/Antiplatelet: none Pre-op Labs Ordered:determined by Anesthesia Imaging request: super d ordered  (If, SuperDimension CT Chest, please have STAT courier sent to ENDO)

## 2024-09-11 NOTE — Telephone Encounter (Signed)
 Letter is given to patient by [facility]. Routing to [employer] for [employer]

## 2024-09-11 NOTE — Progress Notes (Signed)
 New Patient Pulmonology Office Visit   Subjective:  Patient ID: Emily Hayes, female    DOB: [birth year]  MRN: [id number]  Referred by: Loretha Ash, MD  CC:  Chief Complaint  Patient presents with   Consult    Lung nodule. Patient denies any breathing issues.    HPI Emily Hayes is a 73 y.o. female who is referred for possible bronchoscopic biopsy.  Discussed the use of AI scribe software for clinical note transcription with the patient, who gave verbal consent to proceed.  History of Present Illness Emily Hayes is a 73 year old female with metastatic breast cancer who presents for evaluation of a lung lesion. She is accompanied by her daughter, Emily Hayes, who is her primary caregiver. She was referred by her cancer doctor for evaluation of a lung lesion to determine the extent of her cancer.  She has a history of metastatic breast cancer with known brain involvement. A recent PET scan revealed multiple areas of concern in the lungs, including spots on both the right and left sides, as well as lymph node involvement.  No prior CT chest for comparison  PET/CT also showed hypermetabolic spinal canal lesions consistent with spinal metastasis and patient is currently undergoing radiation therapy for her spine and brain mets  She has a significant smoking history, having smoked for fifty years, but has cut down significantly. She has attempted to quit using patches, but frequent MRIs and upcoming radiation treatments have made continuous use difficult. She has also tried gums and lozenges in the past, which caused mouth soreness.  Her past medical history includes ovarian cancer 42 years ago, which was treated surgically without further treatment. She has hyperlipidemia and pre-diabetes. She reports being generally healthy and active, with no recent colds or flu. She has dense, fibrous breasts, making self-exams challenging. Genetic testing showed she is BRCA1 and  BRCA2 negative, with a family history of colorectal cancer.  She is currently on Lyrica , which provides significant pain relief, although she experiences pain when the medication wears off. No symptoms from the lung lesions, such as chest pain or hemoptysis, and no neurological symptoms from the brain lesions, such as headaches or vision problems.       ROS Review of symptoms negative except mentioned above   Allergies: Patient has no known allergies.  Current Outpatient Medications:    acetaminophen  (TYLENOL ) 500 MG tablet, Take 500 mg by mouth daily as needed., Disp: , Rfl:    B Complex-Folic Acid (B COMPLEX-VITAMIN B12 PO), Take 1 tablet by mouth daily., Disp: , Rfl:    Calcium  Citrate-Vitamin D (CALCIUM  CITRATE + D PO), Take 1 tablet by mouth 2 (two) times daily., Disp: , Rfl:    diphenhydrAMINE (BENADRYL) 25 MG tablet, Take 25 mg by mouth as needed. , Disp: , Rfl:    losartan  (COZAAR ) 25 MG tablet, Take 1 tablet (25 mg total) by mouth daily., Disp: 90 tablet, Rfl: 1   Magnesium Carbonate, Antacid, (MAGNESIUM CARBONATE PO), Take 240 mg by mouth 2 (two) times daily., Disp: , Rfl:    Misc Natural Products (COLON CLEANSE) CAPS, Take by mouth as needed. As needed, Disp: , Rfl:    naproxen sodium (ALEVE) 220 MG tablet, Take 220 mg by mouth 2 (two) times daily as needed (pain)., Disp: , Rfl:    pregabalin  (LYRICA ) 75 MG capsule, Take 1 capsule (75 mg total) by mouth 2 (two) times daily., Disp: 60 capsule, Rfl: 2   rosuvastatin  (CRESTOR ) 40  MG tablet, Take 1 tablet (40 mg total) by mouth daily., Disp: 90 tablet, Rfl: 1   vitamin E 400 UNIT capsule, Take 1,200 Units by mouth daily. , Disp: , Rfl:    meloxicam (MOBIC) 15 MG tablet, Take 15 mg by mouth daily. (Patient not taking: Reported on 09/11/2024), Disp: , Rfl:    Simethicone (GAS RELIEF EXTRA STRENGTH PO), Take 1 capsule by mouth as needed. (Patient not taking: Reported on 09/11/2024), Disp: , Rfl:    traMADol (ULTRAM) 50 MG tablet, Take  50 mg by mouth every 6 (six) hours as needed. (Patient not taking: Reported on 09/11/2024), Disp: , Rfl:  Past Medical History:  Diagnosis Date   Cervical radiculopathy    Family history of cancer    Family history of lung cancer    Fatigue    Generalized osteoarthrosis    HTN (hypertension)    Hyperlipidemia    Lateral epicondylitis of left elbow 06/28/2017   Left knee pain    Menopause    Neck pain    Ovarian cancer (HCC) 1983   Pneumonia    Pre-diabetes    Sciatica    Swelling of left knee joint    Past Surgical History:  Procedure Laterality Date   ABCESS DRAINAGE Left 09/15/2017   behind tonsil   ABDOMINAL HYSTERECTOMY  1983   APPENDECTOMY     BREAST BIOPSY Right 07/26/2024   US  RT BREAST BX W LOC DEV 1ST LESION IMG BX SPEC US  GUIDE 07/26/2024 [facility]   BREAST BIOPSY Right 07/26/2024   MM RT BREAST BX W LOC DEV 1ST LESION IMAGE BX SPEC STEREO GUIDE 07/26/2024 [facility]   EXCISION METACARPAL MASS Right 12/29/2023   Procedure: EXCISION METACARPAL MASS;  Surgeon: Jerri Kay HERO, MD;  Location: [facility];  Service: Orthopedics;  Laterality: Right;  right thumb and long finger cyst removal   Family History  Problem Relation Age of Onset   Stroke Mother    Diabetes Mother    Dementia Mother    Alzheimer's disease Mother    Cancer Sister        unknown   Cancer Sister        unknown   Cancer Sister        unknown   Cancer Sister        brain tumor   Colon polyps Brother    Hypertension Brother    Hyperlipidemia Brother    Kidney failure Brother    Lung cancer Brother    Colon cancer Neg Hx    Esophageal cancer Neg Hx    Rectal cancer Neg Hx    Stomach cancer Neg Hx    Social History   Socioeconomic History   Marital status: Divorced    Spouse name: Not on file   Number of children: 2   Years of education: Not on file   Highest education level: Associate degree: academic program  Occupational History   Occupation: retired   Tobacco Use   Smoking status: Every Day    Current packs/day: 0.50    Average packs/day: 0.5 packs/day for 50.0 years (25.0 ttl pk-yrs)    Types: Cigarettes   Smokeless tobacco: Never   Tobacco comments:    Started smoking at age 65.  6 cigarettes a day. 09/11/2024.  Vaping Use   Vaping status: Never Used  Substance and Sexual Activity   Alcohol use: No   Drug use: No   Sexual activity: Not Currently  Birth control/protection: Surgical    Comment: Hyst  Other Topics Concern   Not on file  Social History Narrative   Social History      Diet? none      Do you drink/eat things with caffeine? Chocolate- occasionally      Marital status?        divorced                            What year were you married? 1982      Do you live in a house, apartment, assisted living, condo, trailer, etc.? apartment      Is it one or more stories? 1      How many persons live in your home? 1       Do you have any pets in your home? (please list) yes, dog      Highest level of education completed? Associate degree- college      Current or past profession: retired      Do you exercise?           yes                           Type & how often? Walk- daily      Advanced Directives      Do you have a living will? yes      Do you have a DNR form?     yes                             If not, do you want to discuss one? yes      Do you have signed POA/HPOA for forms? no      Functional Status      Do you have difficulty bathing or dressing yourself?      Do you have difficulty preparing food or eating?       Do you have difficulty managing your medications?      Do you have difficulty managing your finances?      Do you have difficulty affording your medications?   Social Drivers of Corporate Investment Banker Strain: Low Risk  (05/29/2024)   Overall Financial Resource Strain (CARDIA)    Difficulty of Paying Living Expenses: Not hard at all  Food Insecurity: No Food Insecurity  (09/05/2024)   Hunger Vital Sign    Worried About Running Out of Food in the Last Year: Never true    Ran Out of Food in the Last Year: Never true  Transportation Needs: No Transportation Needs (09/05/2024)   PRAPARE - Administrator, Civil Service (Medical): No    Lack of Transportation (Non-Medical): No  Physical Activity: Sufficiently Active (10/01/2023)   Exercise Vital Sign    Days of Exercise per Week: 7 days    Minutes of Exercise per Session: 40 min  Stress: No Stress Concern Present (10/01/2023)   [employer] of Occupational Health - Occupational Stress Questionnaire    Feeling of Stress : Not at all  Social Connections: Socially Isolated (05/29/2024)   Social Connection and Isolation Panel    Frequency of Communication with Friends and Family: Three times a week    Frequency of Social Gatherings with Friends and Family: Twice a week    Attends Religious Services: Patient declined    Active Member of  Clubs or Organizations: No    Attends Banker Meetings: Not on file    Marital Status: Divorced  Intimate Partner Violence: Not At Risk (09/05/2024)   Humiliation, Afraid, Rape, and Kick questionnaire    Fear of Current or Ex-Partner: No    Emotionally Abused: No    Physically Abused: No    Sexually Abused: No         Objective:  BP 130/80   Pulse 91   Temp 98.5 F (36.9 C) (Oral)   Ht 5' 6 (1.676 m)   Wt 121 lb (54.9 kg)   SpO2 97%   BMI 19.53 kg/m    Physical Exam  Diagnostic Review:    Pft     No data to display               Results RADIOLOGY personally reviewed  PET CT: 1. Hypermetabolic right breast mass with biopsy clip, consistent with primary breast carcinoma. 2. Hypermetabolic left upper lobe nodule and hypermetabolic left hilar node, consistent with pulmonary metastases. 3. Mildly hypermetabolic right upper lobe nodule, consistent with pulmonary metastasis. 4. Hypermetabolic spinal canal lesions at L2  and L5, consistent with spinal metastases.       Assessment & Plan:   Assessment & Plan Lung nodules  Orders:   CT SUPER D CHEST WO CONTRAST; Future  Malignant neoplasm of breast metastatic to brain, unspecified laterality (HCC)  Orders:   CT SUPER D CHEST WO CONTRAST; Future  Cancer, metastatic to bone [facility])  Orders:   CT SUPER D CHEST WO CONTRAST; Future  Smoker    73 year old female with recently diagnosed metastatic brain cancer with metastasis to brain, spine and concerning lung nodules and lymph node uptake Undergoing radiation therapy for her brain mets and spinal mets Referred to this clinic for bronchoscopic biopsy of her lung nodule Radiographically is consistent with metastatic disease in the lung.  However patient does have significant smoking history and is at risk for lung cancer as well.  Other differentials include unusual infections.  Patient is undergoing radiation treatment from December 2 till December 12 Will set up a super D CT chest prior to bronchoscopy planning  Patient will likely need bronchoscopy with biopsy of at least 2 nodules unless onsite exam is consistent with metastatic breast lesion  Reviewed PET scan with patient and her daughter in clinic today.   I explained the bronchoscopic biopsy procedure at depth.  We discussed about other potential options including continued follow up, CT guided biopsy, surgical biopsy. Risks and benefits of bronchoscopy +/- biopsy discussed with the patient and his family in details and all the questions were answered. They understand the risk of bleeding, pneumothorax, injury to blood vessels and would like to proceed for the bronchoscopy with biopsy.    Thank you for the opportunity to take part in the care of Emily Hayes   Return in about 4 weeks (around 10/09/2024).   Jaedynn Bohlken Pleas, MD [facility] Office: [phone]   60-minute time devoted to history taking, review of  images, review of prior radiation oncology and oncology notes and communication notes, review of PET/CT scan, discussion of risks and benefits of bronchoscopic biopsy with the patient and her daughter, scheduling bronchoscopy and care coordination

## 2024-09-11 NOTE — Assessment & Plan Note (Addendum)
    73 year old female with recently diagnosed metastatic brain cancer with metastasis to brain, spine and concerning lung nodules and lymph node uptake Undergoing radiation therapy for her brain mets and spinal mets Referred to this clinic for bronchoscopic biopsy of her lung nodule Radiographically is consistent with metastatic disease in the lung.  However patient does have significant smoking history and is at risk for lung cancer as well.  Other differentials include unusual infections.  Patient is undergoing radiation treatment from December 2 till December 12 Will set up a super D CT chest prior to bronchoscopy planning  Patient will likely need bronchoscopy with biopsy of at least 2 nodules unless onsite exam is consistent with metastatic breast lesion  Reviewed PET scan with patient and her daughter in clinic today.   I explained the bronchoscopic biopsy procedure at depth.  We discussed about other potential options including continued follow up, CT guided biopsy, surgical biopsy. Risks and benefits of bronchoscopy +/- biopsy discussed with the patient and his family in details and all the questions were answered. They understand the risk of bleeding, pneumothorax, injury to blood vessels and would like to proceed for the bronchoscopy with biopsy.

## 2024-09-11 NOTE — Assessment & Plan Note (Addendum)
  Orders:   CT SUPER D CHEST WO CONTRAST; Future

## 2024-09-11 NOTE — H&P (View-Only) (Signed)
 New Patient Pulmonology Office Visit   Subjective:  Patient ID: Emily Hayes, female    DOB: [birth year]  MRN: [id number]  Referred by: Loretha Ash, MD  CC:  Chief Complaint  Patient presents with   Consult    Lung nodule. Patient denies any breathing issues.    HPI Emily Hayes is a 73 y.o. female who is referred for possible bronchoscopic biopsy.  Discussed the use of AI scribe software for clinical note transcription with the patient, who gave verbal consent to proceed.  History of Present Illness Emily MORRISSETTE is a 73 year old female with metastatic breast cancer who presents for evaluation of a lung lesion. She is accompanied by her daughter, Dorothyann, who is her primary caregiver. She was referred by her cancer doctor for evaluation of a lung lesion to determine the extent of her cancer.  She has a history of metastatic breast cancer with known brain involvement. A recent PET scan revealed multiple areas of concern in the lungs, including spots on both the right and left sides, as well as lymph node involvement.  No prior CT chest for comparison  PET/CT also showed hypermetabolic spinal canal lesions consistent with spinal metastasis and patient is currently undergoing radiation therapy for her spine and brain mets  She has a significant smoking history, having smoked for fifty years, but has cut down significantly. She has attempted to quit using patches, but frequent MRIs and upcoming radiation treatments have made continuous use difficult. She has also tried gums and lozenges in the past, which caused mouth soreness.  Her past medical history includes ovarian cancer 42 years ago, which was treated surgically without further treatment. She has hyperlipidemia and pre-diabetes. She reports being generally healthy and active, with no recent colds or flu. She has dense, fibrous breasts, making self-exams challenging. Genetic testing showed she is BRCA1 and  BRCA2 negative, with a family history of colorectal cancer.  She is currently on Lyrica , which provides significant pain relief, although she experiences pain when the medication wears off. No symptoms from the lung lesions, such as chest pain or hemoptysis, and no neurological symptoms from the brain lesions, such as headaches or vision problems.       ROS Review of symptoms negative except mentioned above   Allergies: Patient has no known allergies.  Current Outpatient Medications:    acetaminophen  (TYLENOL ) 500 MG tablet, Take 500 mg by mouth daily as needed., Disp: , Rfl:    B Complex-Folic Acid (B COMPLEX-VITAMIN B12 PO), Take 1 tablet by mouth daily., Disp: , Rfl:    Calcium  Citrate-Vitamin D (CALCIUM  CITRATE + D PO), Take 1 tablet by mouth 2 (two) times daily., Disp: , Rfl:    diphenhydrAMINE (BENADRYL) 25 MG tablet, Take 25 mg by mouth as needed. , Disp: , Rfl:    losartan  (COZAAR ) 25 MG tablet, Take 1 tablet (25 mg total) by mouth daily., Disp: 90 tablet, Rfl: 1   Magnesium Carbonate, Antacid, (MAGNESIUM CARBONATE PO), Take 240 mg by mouth 2 (two) times daily., Disp: , Rfl:    Misc Natural Products (COLON CLEANSE) CAPS, Take by mouth as needed. As needed, Disp: , Rfl:    naproxen sodium (ALEVE) 220 MG tablet, Take 220 mg by mouth 2 (two) times daily as needed (pain)., Disp: , Rfl:    pregabalin  (LYRICA ) 75 MG capsule, Take 1 capsule (75 mg total) by mouth 2 (two) times daily., Disp: 60 capsule, Rfl: 2   rosuvastatin  (CRESTOR ) 40  MG tablet, Take 1 tablet (40 mg total) by mouth daily., Disp: 90 tablet, Rfl: 1   vitamin E 400 UNIT capsule, Take 1,200 Units by mouth daily. , Disp: , Rfl:    meloxicam (MOBIC) 15 MG tablet, Take 15 mg by mouth daily. (Patient not taking: Reported on 09/11/2024), Disp: , Rfl:    Simethicone (GAS RELIEF EXTRA STRENGTH PO), Take 1 capsule by mouth as needed. (Patient not taking: Reported on 09/11/2024), Disp: , Rfl:    traMADol (ULTRAM) 50 MG tablet, Take  50 mg by mouth every 6 (six) hours as needed. (Patient not taking: Reported on 09/11/2024), Disp: , Rfl:  Past Medical History:  Diagnosis Date   Cervical radiculopathy    Family history of cancer    Family history of lung cancer    Fatigue    Generalized osteoarthrosis    HTN (hypertension)    Hyperlipidemia    Lateral epicondylitis of left elbow 06/28/2017   Left knee pain    Menopause    Neck pain    Ovarian cancer (HCC) 1983   Pneumonia    Pre-diabetes    Sciatica    Swelling of left knee joint    Past Surgical History:  Procedure Laterality Date   ABCESS DRAINAGE Left 09/15/2017   behind tonsil   ABDOMINAL HYSTERECTOMY  1983   APPENDECTOMY     BREAST BIOPSY Right 07/26/2024   US  RT BREAST BX W LOC DEV 1ST LESION IMG BX SPEC US  GUIDE 07/26/2024 [facility]   BREAST BIOPSY Right 07/26/2024   MM RT BREAST BX W LOC DEV 1ST LESION IMAGE BX SPEC STEREO GUIDE 07/26/2024 [facility]   EXCISION METACARPAL MASS Right 12/29/2023   Procedure: EXCISION METACARPAL MASS;  Surgeon: Jerri Kay HERO, MD;  Location: [facility];  Service: Orthopedics;  Laterality: Right;  right thumb and long finger cyst removal   Family History  Problem Relation Age of Onset   Stroke Mother    Diabetes Mother    Dementia Mother    Alzheimer's disease Mother    Cancer Sister        unknown   Cancer Sister        unknown   Cancer Sister        unknown   Cancer Sister        brain tumor   Colon polyps Brother    Hypertension Brother    Hyperlipidemia Brother    Kidney failure Brother    Lung cancer Brother    Colon cancer Neg Hx    Esophageal cancer Neg Hx    Rectal cancer Neg Hx    Stomach cancer Neg Hx    Social History   Socioeconomic History   Marital status: Divorced    Spouse name: Not on file   Number of children: 2   Years of education: Not on file   Highest education level: Associate degree: academic program  Occupational History   Occupation: retired   Tobacco Use   Smoking status: Every Day    Current packs/day: 0.50    Average packs/day: 0.5 packs/day for 50.0 years (25.0 ttl pk-yrs)    Types: Cigarettes   Smokeless tobacco: Never   Tobacco comments:    Started smoking at age 65.  6 cigarettes a day. 09/11/2024.  Vaping Use   Vaping status: Never Used  Substance and Sexual Activity   Alcohol use: No   Drug use: No   Sexual activity: Not Currently  Birth control/protection: Surgical    Comment: Hyst  Other Topics Concern   Not on file  Social History Narrative   Social History      Diet? none      Do you drink/eat things with caffeine? Chocolate- occasionally      Marital status?        divorced                            What year were you married? 1982      Do you live in a house, apartment, assisted living, condo, trailer, etc.? apartment      Is it one or more stories? 1      How many persons live in your home? 1       Do you have any pets in your home? (please list) yes, dog      Highest level of education completed? Associate degree- college      Current or past profession: retired      Do you exercise?           yes                           Type & how often? Walk- daily      Advanced Directives      Do you have a living will? yes      Do you have a DNR form?     yes                             If not, do you want to discuss one? yes      Do you have signed POA/HPOA for forms? no      Functional Status      Do you have difficulty bathing or dressing yourself?      Do you have difficulty preparing food or eating?       Do you have difficulty managing your medications?      Do you have difficulty managing your finances?      Do you have difficulty affording your medications?   Social Drivers of Corporate Investment Banker Strain: Low Risk  (05/29/2024)   Overall Financial Resource Strain (CARDIA)    Difficulty of Paying Living Expenses: Not hard at all  Food Insecurity: No Food Insecurity  (09/05/2024)   Hunger Vital Sign    Worried About Running Out of Food in the Last Year: Never true    Ran Out of Food in the Last Year: Never true  Transportation Needs: No Transportation Needs (09/05/2024)   PRAPARE - Administrator, Civil Service (Medical): No    Lack of Transportation (Non-Medical): No  Physical Activity: Sufficiently Active (10/01/2023)   Exercise Vital Sign    Days of Exercise per Week: 7 days    Minutes of Exercise per Session: 40 min  Stress: No Stress Concern Present (10/01/2023)   [employer] of Occupational Health - Occupational Stress Questionnaire    Feeling of Stress : Not at all  Social Connections: Socially Isolated (05/29/2024)   Social Connection and Isolation Panel    Frequency of Communication with Friends and Family: Three times a week    Frequency of Social Gatherings with Friends and Family: Twice a week    Attends Religious Services: Patient declined    Active Member of  Clubs or Organizations: No    Attends Banker Meetings: Not on file    Marital Status: Divorced  Intimate Partner Violence: Not At Risk (09/05/2024)   Humiliation, Afraid, Rape, and Kick questionnaire    Fear of Current or Ex-Partner: No    Emotionally Abused: No    Physically Abused: No    Sexually Abused: No         Objective:  BP 130/80   Pulse 91   Temp 98.5 F (36.9 C) (Oral)   Ht 5' 6 (1.676 m)   Wt 121 lb (54.9 kg)   SpO2 97%   BMI 19.53 kg/m    Physical Exam  Diagnostic Review:    Pft     No data to display               Results RADIOLOGY personally reviewed  PET CT: 1. Hypermetabolic right breast mass with biopsy clip, consistent with primary breast carcinoma. 2. Hypermetabolic left upper lobe nodule and hypermetabolic left hilar node, consistent with pulmonary metastases. 3. Mildly hypermetabolic right upper lobe nodule, consistent with pulmonary metastasis. 4. Hypermetabolic spinal canal lesions at L2  and L5, consistent with spinal metastases.       Assessment & Plan:   Assessment & Plan Lung nodules  Orders:   CT SUPER D CHEST WO CONTRAST; Future  Malignant neoplasm of breast metastatic to brain, unspecified laterality (HCC)  Orders:   CT SUPER D CHEST WO CONTRAST; Future  Cancer, metastatic to bone [facility])  Orders:   CT SUPER D CHEST WO CONTRAST; Future  Smoker    73 year old female with recently diagnosed metastatic brain cancer with metastasis to brain, spine and concerning lung nodules and lymph node uptake Undergoing radiation therapy for her brain mets and spinal mets Referred to this clinic for bronchoscopic biopsy of her lung nodule Radiographically is consistent with metastatic disease in the lung.  However patient does have significant smoking history and is at risk for lung cancer as well.  Other differentials include unusual infections.  Patient is undergoing radiation treatment from December 2 till December 12 Will set up a super D CT chest prior to bronchoscopy planning  Patient will likely need bronchoscopy with biopsy of at least 2 nodules unless onsite exam is consistent with metastatic breast lesion  Reviewed PET scan with patient and her daughter in clinic today.   I explained the bronchoscopic biopsy procedure at depth.  We discussed about other potential options including continued follow up, CT guided biopsy, surgical biopsy. Risks and benefits of bronchoscopy +/- biopsy discussed with the patient and his family in details and all the questions were answered. They understand the risk of bleeding, pneumothorax, injury to blood vessels and would like to proceed for the bronchoscopy with biopsy.    Thank you for the opportunity to take part in the care of JOYLEEN HASELTON   Return in about 4 weeks (around 10/09/2024).   Jaedynn Bohlken Pleas, MD [facility] Office: [phone]   60-minute time devoted to history taking, review of  images, review of prior radiation oncology and oncology notes and communication notes, review of PET/CT scan, discussion of risks and benefits of bronchoscopic biopsy with the patient and her daughter, scheduling bronchoscopy and care coordination

## 2024-09-11 NOTE — Telephone Encounter (Signed)
 [facility] informed us  that patient wants the ct chest done 09/15/2024 around 11am-1pm. Please can reschedule this around this time..  I called [facility] and they are closed on 11/28. I have attempted to call patient twice. Left voicemail letting patient know that they are not open on 11/28 for CT scans. I provided the number to reschedule the CT scan to the patient incase the would still like to reschedule. [phone].

## 2024-09-12 ENCOUNTER — Encounter: Payer: Self-pay | Admitting: Hematology and Oncology

## 2024-09-12 ENCOUNTER — Telehealth: Payer: Self-pay

## 2024-09-12 ENCOUNTER — Ambulatory Visit: Admitting: Pulmonary Disease

## 2024-09-12 ENCOUNTER — Ambulatory Visit
Admission: RE | Admit: 2024-09-12 | Discharge: 2024-09-12 | Disposition: A | Discharge status: Home / Self Care | Source: Ambulatory Visit

## 2024-09-12 DIAGNOSIS — C50919 Malignant neoplasm of unspecified site of unspecified female breast: Secondary | ICD-10-CM

## 2024-09-12 DIAGNOSIS — Z510 Encounter for antineoplastic radiation therapy: Secondary | ICD-10-CM | POA: Diagnosis not present

## 2024-09-12 DIAGNOSIS — R918 Other nonspecific abnormal finding of lung field: Secondary | ICD-10-CM

## 2024-09-12 DIAGNOSIS — C7951 Secondary malignant neoplasm of bone: Secondary | ICD-10-CM

## 2024-09-12 LAB — GUARDANT REVEAL

## 2024-09-12 NOTE — Telephone Encounter (Signed)
 Patient requested to move the CT to 11/28, which is a holiday. I attempted to contact the patient twice to inform her that we are unable to schedule the CT on 11/28. I left a voicemail advising that the CT cannot be completed on that date and provided the scheduling number for her to call and reschedule. The patient was already aware of the appointment, as it was included in the bronchoscopy letter provided to her by the CMA. Patient is currently at her CT appointment that was rescheduled by her. Nothing further needed

## 2024-09-12 NOTE — Telephone Encounter (Signed)
 Patient called and LM. I called back to inform that Yes, Dr. Debby was referred to you by our office. I left two messages and provided a call back number for more questions if they arise.

## 2024-09-13 ENCOUNTER — Other Ambulatory Visit

## 2024-09-13 NOTE — Progress Notes (Signed)
 Nurse monitoring complete status post SRS treatments. Patient without complaints. Patient denies new or worsening neurologic symptoms. Vitals stable. Instructed patient to avoid strenuous activity for the next 24 hours. (Instructed patient to not miss any of her decadron doses). Instructed patient to call [phone] with needs related to treatment after hours or over the weekend. Patient verbalized understanding   Emily Hayes rested with us  for 15 minutes following SRS treatments.  Patient denies headache, dizziness, nausea, diplopia or ringing in the ears. Denies fatigue. Patient without complaints. Understands to avoid strenuous activity for the next 24 hours and call [phone] with needs.

## 2024-09-15 ENCOUNTER — Inpatient Hospital Stay

## 2024-09-15 ENCOUNTER — Inpatient Hospital Stay: Admitting: Hematology and Oncology

## 2024-09-15 ENCOUNTER — Encounter: Payer: Self-pay | Admitting: *Deleted

## 2024-09-15 VITALS — BP 148/84 | HR 90 | Temp 97.9°F | Resp 18 | Ht 66.0 in | Wt 122.8 lb

## 2024-09-15 DIAGNOSIS — C7931 Secondary malignant neoplasm of brain: Secondary | ICD-10-CM

## 2024-09-15 DIAGNOSIS — G96198 Other disorders of meninges, not elsewhere classified: Secondary | ICD-10-CM

## 2024-09-15 DIAGNOSIS — C679 Malignant neoplasm of bladder, unspecified: Secondary | ICD-10-CM | POA: Diagnosis not present

## 2024-09-15 DIAGNOSIS — C50919 Malignant neoplasm of unspecified site of unspecified female breast: Secondary | ICD-10-CM

## 2024-09-15 NOTE — Progress Notes (Signed)
 [facility] CONSULT NOTE  Patient Care Team: [name] LABOR, MD as PCP - General (Family Medicine) Joshua Rush, OD (Optometry) Gerome, Devere HERO, RN as Oncology Nurse Navigator Tyree, Nanetta SAILOR, RN as Oncology Nurse Navigator Aron Shoulders, MD as Consulting Physician (General Surgery) Loretha Ash, MD as Consulting Physician (Hematology and Oncology) Izell Domino, MD as Attending Physician (Radiation Oncology)  CHIEF COMPLAINTS/PURPOSE OF CONSULTATION:  New diagnosis of right sided breast cancer  ASSESSMENT & PLAN:   Assessment and Plan Assessment & Plan Invasive ductal carcinoma and ductal carcinoma in situ (DCIS) of right breast, estrogen receptor positive, progesterone receptor negative, intermediate grade When we last saw her, original plan was lumpectomy, oncotype testing, adj rad and antiestrogen therapy However she complained of excessive back pain, hence had additional imaging and she is here to discuss this.  Metastatic brain lesions  Multiple brain lesions on MRI,  No significant edema. 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  Pulmonary nodules and hilar lymphadenopathy, suspicious for metastatic disease Two lung nodules and hilar lymphadenopathy on PET scan. Differential includes metastatic breast cancer or primary lung cancer. She is a long term smoker. - Schedule biopsy of lung nodule or hilar lymph node. - This is scheduled with Dr Shelah for Dec 15 th.  Neuropathic pain Lyrica  preferred for better symptom control. - Continue Lyrica  for neuropathic pain management.  Constipation Managed with stool softeners. Dietary adjustments discussed  due to radiation effects. - Continue stool softeners. - Adjust diet to reduce fiber intake, including white pasta, white rice, and white bread. - Consider laxatives like Miralax if stool softeners are ineffective.  Cognitive symptoms (memory loss, word-finding difficulty) Symptoms possibly related to Lyrica  or brain metastases. Not daily, may be medication-related. - Monitor cognitive symptoms for progression. - Evaluate if symptoms worsen to determine if related to Lyrica  or brain metastases.  HISTORY OF PRESENTING ILLNESS:  Emily Hayes 73 y.o. female is here because of new diagnosis of breast cancer  Oncology History  Malignant neoplasm of upper-outer quadrant of right breast in female, estrogen receptor positive (HCC)  07/07/2024 Mammogram   Highly suspicious 1.9 cm irregular mass in the right breast at 10 o'clock, 6 cm the nipple, with associated mammographic architectural distortion. 2. Suspicious calcifications in the lateral and upper outer right breast spanning at least 5 cm from anterior to posterior. 3. Likely benign 1 mm group of calcifications in the medial right breast, only visualized discretely on the cc view.   07/26/2024 Pathology Results   1. Breast, right, needle core biopsy, 10:00 6 cmfn (clip heart) :      INVASIVE MODERATELY DIFFERENTIATED DUCTAL ADENOCARCINOMA, GRADE 2 (3+2+1)      TUBULE FORMATION: SCORE 3      NUCLEAR PLEOMORPHISM: SCORE 2      MITOTIC COUNT: SCORE 1      TOTAL SCORE: 6      OVERALL GRADE: GRADE 2 (6/9)      NEGATIVE FOR ANGIOLYMPHATIC INVASION      TUMOR MEASURES 13 MM IN GREATEST LINEAR EXTENT      ADJACENT FIBROCYSTIC CHANGES INCLUDING STROMAL FIBROSIS AND ADENOSIS      MICROCALCIFICATIONS PRESENT WITHIN BENIGN GNOSIS  2. Breast, right, needle core biopsy, calcifications, central, (clip x) :      FOCAL DUCTAL CARCINOMA IN SITU, INTERMEDIATE NUCLEAR GRADE, CRIBRIFORM AND SOLID      TYPES WITHOUT NECROSIS      NEGATIVE FOR  INVASIVE CARCINOMA      DCIS MEASURES 4 MM IN GREATEST LINEAR EXTENT      EXTENSIVE STROMAL FIBROSIS WITH FOCAL USUAL DUCT HYPERPLASIA      MICROCALCIFICATIONS PRESENT WITHIN DCIS AND USUAL DUCT HYPERPLASIA    07/28/2024 Initial Diagnosis   Malignant neoplasm of upper-outer quadrant of right breast in female, estrogen receptor positive (HCC)   08/02/2024 Cancer Staging   Staging form: Breast, AJCC 8th Edition - Clinical: Stage IA (cT1c, cN0, cM0, G2, ER+, PR-, HER2-) - Signed by Loretha Ash, MD on 08/02/2024 Histologic grading system: 3 grade system    Genetic Testing   Negative [facility] BRCAPlus + RNA (13 gene) panel. Report date 08/11/2024. 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 There is currently no evidence to suggest a significantly increased cancer risk for carriers of MUTYH pathogenic variants. Report date 08/14/2024.     Discussed the use of AI scribe software for clinical note transcription with the patient, who gave verbal consent to proceed.  History of Present Illness  Emily Hayes is a 73 year old female with a history of ovarian cancer many yrs ago and most recent diagnosis of breast cancerwho presents with concerns about metastatic disease following recent imaging studies. She is accompanied by her daughter and daughter's girlfriend.  She has undergone multiple imaging studies, including a PET scan and MRI of the brain, cervical spine, and thoracic spine, following the onset of back pain. The imaging revealed a breast mass on the right side, lesions in the brain, findings in the spinal canal, lung nodules, and hilar lymph nodes.  She is scheduled to begin stereotactic radiosurgery [facility]) for brain metastases on Monday, with a total of twelve sessions planned. She hopes the treatment will provide relief.  She experiences ongoing neuropathic pain, initially managed with gabapentin  without success. Tramadol was  prescribed but not taken consistently. She has since transitioned to Lyrica , which she finds effective despite initial side effects of dizziness and numbness. She experiences numbness in her hip, leg, and foot, particularly when lying down.  A lung biopsy is scheduled for December 15th, following the completion of her radiation therapy on December 12th. The current date was the earliest available for the biopsy, according to the patient.  She has been taking a stool softener to manage constipation-related pain. She experiences some forgetfulness and word-finding difficulties, which she attributes to Lyrica , though she is monitoring these symptoms closely.  No new symptoms are reported, but she notes numbness in her hip, leg, and foot, as well as some forgetfulness and word-finding difficulties, which she associates with her medication.  All other systems were reviewed with the patient and are negative.  MEDICAL HISTORY:  Past Medical History:  Diagnosis Date   Cervical radiculopathy    Family history of cancer    Family history of lung cancer    Fatigue    Generalized osteoarthrosis    HTN (hypertension)    Hyperlipidemia    Lateral epicondylitis of left elbow 06/28/2017   Left knee pain    Menopause    Neck pain    Ovarian cancer (HCC) 1983   Pneumonia    Pre-diabetes    Sciatica    Swelling  of left knee joint     SURGICAL HISTORY: Past Surgical History:  Procedure Laterality Date   ABCESS DRAINAGE Left 09/15/2017   behind tonsil   ABDOMINAL HYSTERECTOMY  1983   APPENDECTOMY     BREAST BIOPSY Right 07/26/2024   US  RT BREAST BX W LOC DEV 1ST LESION IMG BX SPEC US  GUIDE 07/26/2024 [facility]   BREAST BIOPSY Right 07/26/2024   MM RT BREAST BX W LOC DEV 1ST LESION IMAGE BX SPEC STEREO GUIDE 07/26/2024 [facility]   EXCISION METACARPAL MASS Right 12/29/2023   Procedure: EXCISION METACARPAL MASS;  Surgeon: Jerri Kay HERO, MD;  Location: [facility];   Service: Orthopedics;  Laterality: Right;  right thumb and long finger cyst removal    SOCIAL HISTORY: Social History   Socioeconomic History   Marital status: Divorced    Spouse name: Not on file   Number of children: 2   Years of education: Not on file   Highest education level: Associate degree: academic program  Occupational History   Occupation: retired  Tobacco Use   Smoking status: Every Day    Current packs/day: 0.50    Average packs/day: 0.5 packs/day for 50.0 years (25.0 ttl pk-yrs)    Types: Cigarettes   Smokeless tobacco: Never   Tobacco comments:    Started smoking at age 5.  6 cigarettes a day. 09/11/2024.  Vaping Use   Vaping status: Never Used  Substance and Sexual Activity   Alcohol use: No   Drug use: No   Sexual activity: Not Currently    Birth control/protection: Surgical    Comment: Hyst  Other Topics Concern   Not on file  Social History Narrative   Social History      Diet? none      Do you drink/eat things with caffeine? Chocolate- occasionally      Marital status?        divorced                            What year were you married? 1982      Do you live in a house, apartment, assisted living, condo, trailer, etc.? apartment      Is it one or more stories? 1      How many persons live in your home? 1       Do you have any pets in your home? (please list) yes, dog      Highest level of education completed? Associate degree- college      Current or past profession: retired      Do you exercise?           yes                           Type & how often? Walk- daily      Advanced Directives      Do you have a living will? yes      Do you have a DNR form?     yes                             If not, do you want to discuss one? yes      Do you have signed POA/HPOA for forms? no      Functional Status      Do you have difficulty  bathing or dressing yourself?      Do you have difficulty preparing food or eating?       Do you have  difficulty managing your medications?      Do you have difficulty managing your finances?      Do you have difficulty affording your medications?   Social Drivers of Corporate Investment Banker Strain: Low Risk  (05/29/2024)   Overall Financial Resource Strain (CARDIA)    Difficulty of Paying Living Expenses: Not hard at all  Food Insecurity: No Food Insecurity (09/05/2024)   Hunger Vital Sign    Worried About Running Out of Food in the Last Year: Never true    Ran Out of Food in the Last Year: Never true  Transportation Needs: No Transportation Needs (09/05/2024)   PRAPARE - Administrator, Civil Service (Medical): No    Lack of Transportation (Non-Medical): No  Physical Activity: Sufficiently Active (10/01/2023)   Exercise Vital Sign    Days of Exercise per Week: 7 days    Minutes of Exercise per Session: 40 min  Stress: No Stress Concern Present (10/01/2023)   [employer] of Occupational Health - Occupational Stress Questionnaire    Feeling of Stress : Not at all  Social Connections: Socially Isolated (05/29/2024)   Social Connection and Isolation Panel    Frequency of Communication with Friends and Family: Three times a week    Frequency of Social Gatherings with Friends and Family: Twice a week    Attends Religious Services: Patient declined    Database Administrator or Organizations: No    Attends Engineer, Structural: Not on file    Marital Status: Divorced  Intimate Partner Violence: Not At Risk (09/05/2024)   Humiliation, Afraid, Rape, and Kick questionnaire    Fear of Current or Ex-Partner: No    Emotionally Abused: No    Physically Abused: No    Sexually Abused: No    FAMILY HISTORY: Family History  Problem Relation Age of Onset   Stroke Mother    Diabetes Mother    Dementia Mother    Alzheimer's disease Mother    Cancer Sister        unknown   Cancer Sister        unknown   Cancer Sister        unknown   Cancer Sister         brain tumor   Colon polyps Brother    Hypertension Brother    Hyperlipidemia Brother    Kidney failure Brother    Lung cancer Brother    Colon cancer Neg Hx    Esophageal cancer Neg Hx    Rectal cancer Neg Hx    Stomach cancer Neg Hx     ALLERGIES:  has no known allergies.  MEDICATIONS:  Current Outpatient Medications  Medication Sig Dispense Refill   B Complex-Folic Acid (B COMPLEX-VITAMIN B12 PO) Take 1 tablet by mouth daily.     Calcium  Citrate-Vitamin D (CALCIUM  CITRATE + D PO) Take 1 tablet by mouth 2 (two) times daily.     diphenhydrAMINE (BENADRYL) 25 MG tablet Take 25 mg by mouth as needed.      losartan  (COZAAR ) 25 MG tablet Take 1 tablet (25 mg total) by mouth daily. 90 tablet 1   Magnesium Carbonate, Antacid, (MAGNESIUM CARBONATE PO) Take 240 mg by mouth 2 (two) times daily.     pregabalin  (LYRICA ) 75 MG capsule Take 1 capsule (75 mg  total) by mouth 2 (two) times daily. 60 capsule 2   rosuvastatin  (CRESTOR ) 40 MG tablet Take 1 tablet (40 mg total) by mouth daily. 90 tablet 1   Simethicone (GAS RELIEF EXTRA STRENGTH PO) Take 1 capsule by mouth as needed.     vitamin E 400 UNIT capsule Take 1,200 Units by mouth daily.      No current facility-administered medications for this visit.     PHYSICAL EXAMINATION: ECOG PERFORMANCE STATUS: 0 - Asymptomatic  Vitals:   09/15/24 1005 09/15/24 1013  BP: (!) 179/77 (!) 148/84  Pulse: 90   Resp: 18   Temp: 97.9 F (36.6 C)   SpO2: 97%    Filed Weights   09/15/24 1005  Weight: 122 lb 12.8 oz (55.7 kg)    GENERAL:alert, no distress and comfortable   LABORATORY DATA:  I have reviewed the data as listed Lab Results  Component Value Date   WBC 6.6 08/02/2024   HGB 11.2 (L) 08/02/2024   HCT 34.9 (L) 08/02/2024   MCV 83.9 08/02/2024   PLT 239 08/02/2024     Chemistry      Component Value Date/Time   NA 140 08/02/2024 1255   NA 141 01/19/2017 0000   NA 141 01/19/2017 0000   K 4.3 08/02/2024 1255   CL 103  08/02/2024 1255   CO2 32 08/02/2024 1255   BUN 15 08/02/2024 1255   BUN 17 01/19/2017 0000   BUN 17 01/19/2017 0000   CREATININE 0.73 08/02/2024 1255   CREATININE 0.81 05/30/2024 0840   GLU 102 01/19/2017 0000   GLU 102 01/19/2017 0000      Component Value Date/Time   CALCIUM  10.1 08/02/2024 1255   ALKPHOS 54 08/02/2024 1255   AST 22 08/02/2024 1255   ALT 15 08/02/2024 1255   BILITOT 0.3 08/02/2024 1255       RADIOGRAPHIC STUDIES: I have personally reviewed the radiological images as listed and agreed with the findings in the report.   All questions were answered. The patient knows to call the clinic with any problems, questions or concerns.      Amber Stalls, MD 09/15/2024 12:43 PM

## 2024-09-18 ENCOUNTER — Ambulatory Visit
Admission: RE | Admit: 2024-09-18 | Discharge: 2024-09-18 | Disposition: A | Source: Ambulatory Visit | Attending: Radiation Oncology

## 2024-09-18 ENCOUNTER — Ambulatory Visit
Admission: RE | Admit: 2024-09-18 | Discharge: 2024-09-18 | Disposition: A | Source: Ambulatory Visit | Attending: Radiation Oncology | Admitting: Radiation Oncology

## 2024-09-18 ENCOUNTER — Other Ambulatory Visit: Payer: Self-pay

## 2024-09-18 DIAGNOSIS — F17210 Nicotine dependence, cigarettes, uncomplicated: Secondary | ICD-10-CM | POA: Insufficient documentation

## 2024-09-18 DIAGNOSIS — C50411 Malignant neoplasm of upper-outer quadrant of right female breast: Secondary | ICD-10-CM | POA: Insufficient documentation

## 2024-09-18 DIAGNOSIS — C7931 Secondary malignant neoplasm of brain: Secondary | ICD-10-CM | POA: Insufficient documentation

## 2024-09-18 DIAGNOSIS — Z510 Encounter for antineoplastic radiation therapy: Secondary | ICD-10-CM | POA: Diagnosis present

## 2024-09-18 DIAGNOSIS — Z923 Personal history of irradiation: Secondary | ICD-10-CM | POA: Diagnosis not present

## 2024-09-18 DIAGNOSIS — C3490 Malignant neoplasm of unspecified part of unspecified bronchus or lung: Secondary | ICD-10-CM | POA: Insufficient documentation

## 2024-09-18 DIAGNOSIS — Z79899 Other long term (current) drug therapy: Secondary | ICD-10-CM | POA: Insufficient documentation

## 2024-09-18 DIAGNOSIS — Z1722 Progesterone receptor negative status: Secondary | ICD-10-CM | POA: Diagnosis not present

## 2024-09-18 DIAGNOSIS — Z1732 Human epidermal growth factor receptor 2 negative status: Secondary | ICD-10-CM | POA: Insufficient documentation

## 2024-09-18 DIAGNOSIS — Z801 Family history of malignant neoplasm of trachea, bronchus and lung: Secondary | ICD-10-CM | POA: Diagnosis not present

## 2024-09-18 DIAGNOSIS — Z170 Estrogen receptor positive status [ER+]: Secondary | ICD-10-CM | POA: Insufficient documentation

## 2024-09-18 DIAGNOSIS — C50919 Malignant neoplasm of unspecified site of unspecified female breast: Secondary | ICD-10-CM

## 2024-09-18 DIAGNOSIS — Z79811 Long term (current) use of aromatase inhibitors: Secondary | ICD-10-CM | POA: Insufficient documentation

## 2024-09-18 LAB — RAD ONC ARIA SESSION SUMMARY
Course Elapsed Days: 0
Plan Fractions Treated to Date: 1
Plan Prescribed Dose Per Fraction: 3 Gy
Plan Total Fractions Prescribed: 10
Plan Total Prescribed Dose: 30 Gy
Reference Point Dosage Given to Date: 3 Gy
Reference Point Session Dosage Given: 3 Gy
Session Number: 1

## 2024-09-18 MED ORDER — RADIAPLEXRX EX GEL: Freq: Once | CUTANEOUS | Status: AC

## 2024-09-18 MED ORDER — ONDANSETRON HCL 8 MG PO TABS: 8.0000 mg | ORAL_TABLET | Freq: Three times a day (TID) | ORAL | 3 refills | Status: DC | PRN

## 2024-09-18 NOTE — Procedures (Signed)
 Name: Emily Hayes    MRN: [id number]   Date: 09/18/2024    DOB: 1951/04/03   STEREOTACTIC RADIOSURGERY OPERATIVE NOTE  PRE-OPERATIVE DIAGNOSIS:  Metastatic brain disease  POST-OPERATIVE DIAGNOSIS:  Metastatic brain disease  PROCEDURE:   Stereotactic Radiosurgery to brain metastasis, using TrueBeam Linac device Stereotactic Radiosurgery to brain metastasis, using TrueBeam Linac device, additional lesion Stereotactic Radiosurgery to brain metastasis, using TrueBeam Linac device, additional lesion Stereotactic Radiosurgery to brain metastasis, using TrueBeam Linac device, additional lesion Stereotactic Radiosurgery to brain metastasis, using TrueBeam Linac device, additional lesion Stereotactic Radiosurgery to brain metastasis, using TrueBeam Linac device, additional lesion  SURGEON:  Dorn Ned, MD  RADIATION ONCOLOGIST: Lauraine Golden, MD  TECHNIQUE:  The patient underwent a radiation treatment planning session in the radiation oncology simulation suite under the care of the radiation oncology physician and physicist.  I participated closely in the radiation treatment planning afterwards. The patient underwent planning CT which was fused to 3T high resolution MRI with 1 mm axial slices.  These images were fused on the planning system.  We contoured the gross target volumes and subsequently expanded this to yield the Planning Target Volumes. I actively participated in the planning process.  I helped to define and review the target contours and also the contours of the optic pathway, eyes, brainstem and selected nearby organs at risk.  All the dose constraints for critical structures were reviewed and compared to AAPM Task Group 101.  The prescription dose conformity was reviewed.  I approved the plan electronically.    Accordingly, Emily Hayes  was brought to the TrueBeam stereotactic radiation treatment linac and placed in the custom immobilization mask.  The patient was  aligned according to the IR fiducial markers with BrainLab Exactrac, then orthogonal x-rays were used in ExacTrac with the 6DOF robotic table and the shifts were made to align the patient  Emily Hayes received the first fraction of stereotactic radiosurgery to a prescription dose of 9 Gy uneventfully.  Plan is for total of 3 fractions for total of 27 Gy.  The detailed description of the procedure is recorded in the radiation oncology procedure note.  I was present for the duration of the procedure.  DISPOSITION:   Following delivery, the patient was transported to nursing in stable condition and monitored for possible acute effects to be discharged to home in stable condition with follow-up in one month.  Dorn Ned, MD [facility]

## 2024-09-19 ENCOUNTER — Other Ambulatory Visit: Payer: Self-pay

## 2024-09-19 ENCOUNTER — Ambulatory Visit
Admission: RE | Admit: 2024-09-19 | Discharge: 2024-09-19 | Disposition: A | Source: Ambulatory Visit | Attending: Radiation Oncology

## 2024-09-19 DIAGNOSIS — Z510 Encounter for antineoplastic radiation therapy: Secondary | ICD-10-CM | POA: Diagnosis not present

## 2024-09-19 LAB — RAD ONC ARIA SESSION SUMMARY
Course Elapsed Days: 1
Plan Fractions Treated to Date: 2
Plan Prescribed Dose Per Fraction: 3 Gy
Plan Total Fractions Prescribed: 10
Plan Total Prescribed Dose: 30 Gy
Reference Point Dosage Given to Date: 6 Gy
Reference Point Session Dosage Given: 3 Gy
Session Number: 2

## 2024-09-19 NOTE — Telephone Encounter (Signed)
 Good morning, Patient is anxious for the results of the CT super D.  Please advise.  Thank you.

## 2024-09-19 NOTE — Telephone Encounter (Signed)
 It  shows the known lung nodules, similar size and location. Remain concerning for metastasis.Bronchoscopy will be performed as scheduled, we can't use PET scans for bronchoscopic planning/mapping that day. Hence the CT chest was performed, I dont see any new changes there compared to recent PET scan

## 2024-09-20 ENCOUNTER — Other Ambulatory Visit: Payer: Self-pay

## 2024-09-20 ENCOUNTER — Ambulatory Visit
Admission: RE | Admit: 2024-09-20 | Discharge: 2024-09-20 | Disposition: A | Source: Ambulatory Visit | Attending: Radiation Oncology

## 2024-09-20 ENCOUNTER — Ambulatory Visit
Admission: RE | Admit: 2024-09-20 | Discharge: 2024-09-20 | Disposition: A | Source: Ambulatory Visit | Attending: Radiation Oncology | Admitting: Radiation Oncology

## 2024-09-20 DIAGNOSIS — Z510 Encounter for antineoplastic radiation therapy: Secondary | ICD-10-CM | POA: Diagnosis not present

## 2024-09-20 LAB — RAD ONC ARIA SESSION SUMMARY
Course Elapsed Days: 2
Plan Fractions Treated to Date: 2
Plan Prescribed Dose Per Fraction: 9 Gy
Plan Total Fractions Prescribed: 3
Plan Total Prescribed Dose: 27 Gy
Reference Point Dosage Given to Date: 18 Gy
Reference Point Session Dosage Given: 9 Gy
Session Number: 3

## 2024-09-21 ENCOUNTER — Ambulatory Visit: Admitting: Emergency Medicine

## 2024-09-21 ENCOUNTER — Ambulatory Visit
Admission: RE | Admit: 2024-09-21 | Discharge: 2024-09-21 | Disposition: A | Source: Ambulatory Visit | Attending: Radiation Oncology

## 2024-09-21 ENCOUNTER — Other Ambulatory Visit: Payer: Self-pay

## 2024-09-21 ENCOUNTER — Telehealth: Payer: Self-pay | Admitting: Internal Medicine

## 2024-09-21 ENCOUNTER — Inpatient Hospital Stay: Admitting: Internal Medicine

## 2024-09-21 DIAGNOSIS — Z1732 Human epidermal growth factor receptor 2 negative status: Secondary | ICD-10-CM | POA: Insufficient documentation

## 2024-09-21 DIAGNOSIS — C7931 Secondary malignant neoplasm of brain: Secondary | ICD-10-CM | POA: Diagnosis not present

## 2024-09-21 DIAGNOSIS — Z170 Estrogen receptor positive status [ER+]: Secondary | ICD-10-CM | POA: Insufficient documentation

## 2024-09-21 DIAGNOSIS — G96198 Other disorders of meninges, not elsewhere classified: Secondary | ICD-10-CM

## 2024-09-21 DIAGNOSIS — C50919 Malignant neoplasm of unspecified site of unspecified female breast: Secondary | ICD-10-CM | POA: Diagnosis not present

## 2024-09-21 DIAGNOSIS — C50411 Malignant neoplasm of upper-outer quadrant of right female breast: Secondary | ICD-10-CM | POA: Insufficient documentation

## 2024-09-21 DIAGNOSIS — Z801 Family history of malignant neoplasm of trachea, bronchus and lung: Secondary | ICD-10-CM | POA: Insufficient documentation

## 2024-09-21 DIAGNOSIS — Z510 Encounter for antineoplastic radiation therapy: Secondary | ICD-10-CM | POA: Diagnosis not present

## 2024-09-21 DIAGNOSIS — Z79899 Other long term (current) drug therapy: Secondary | ICD-10-CM | POA: Insufficient documentation

## 2024-09-21 DIAGNOSIS — C3490 Malignant neoplasm of unspecified part of unspecified bronchus or lung: Secondary | ICD-10-CM | POA: Insufficient documentation

## 2024-09-21 DIAGNOSIS — Z79811 Long term (current) use of aromatase inhibitors: Secondary | ICD-10-CM | POA: Insufficient documentation

## 2024-09-21 DIAGNOSIS — Z1722 Progesterone receptor negative status: Secondary | ICD-10-CM | POA: Insufficient documentation

## 2024-09-21 DIAGNOSIS — Z923 Personal history of irradiation: Secondary | ICD-10-CM | POA: Insufficient documentation

## 2024-09-21 DIAGNOSIS — F17210 Nicotine dependence, cigarettes, uncomplicated: Secondary | ICD-10-CM | POA: Insufficient documentation

## 2024-09-21 LAB — RAD ONC ARIA SESSION SUMMARY
Course Elapsed Days: 3
Plan Fractions Treated to Date: 4
Plan Prescribed Dose Per Fraction: 3 Gy
Plan Total Fractions Prescribed: 10
Plan Total Prescribed Dose: 30 Gy
Reference Point Dosage Given to Date: 12 Gy
Reference Point Session Dosage Given: 3 Gy
Session Number: 4

## 2024-09-21 LAB — GUARDANT 360

## 2024-09-21 MED ORDER — PREGABALIN 150 MG PO CAPS: 150.0000 mg | ORAL_CAPSULE | Freq: Two times a day (BID) | ORAL | 2 refills | Status: DC

## 2024-09-21 NOTE — Telephone Encounter (Signed)
 Scheduled patient for phone visit today. Called and left a voicemail with the details.

## 2024-09-21 NOTE — Progress Notes (Signed)
 I connected with [name] on 09/21/24 at  2:00 PM EST by telephone visit and verified that I am speaking with the correct person using two identifiers.  I discussed the limitations, risks, security and privacy concerns of performing an evaluation and management service by telemedicine and the availability of in-person appointments. I also discussed with the patient that there may be a patient responsible charge related to this service. The patient expressed understanding and agreed to proceed.  Other persons participating in the visit and their role in the encounter:  daughter  Patient's location:  Home Provider's location:  Office Chief Complaint:  Malignant neoplasm of breast metastatic to brain, unspecified laterality (HCC)  Leptomeningeal disease  History of Present Ilness: [name] reports worsening clinical symptoms.  She describes electric schock pain running down the back of her left upper leg.  This was happening previously but is worse now.  There is also some fluctuating numbness along the foot and lower leg on the left.  She has nearly completed the brain portion of radiation, the spine will be treated through next Friday.  Observations: Language and cognition at baseline  Assessment and Plan: Malignant neoplasm of breast metastatic to brain, unspecified laterality (HCC)  Leptomeningeal disease  Pain likely secondary to ongoing LMD burden within spine and nerve roots.  Lyrica  had helped somewhat.  Recommended trial of increased dose Lyrica  to 150mg  BID.  If ineffective, will recommend referral to palliative care for more aggressive/refractory pain management.  Follow Up Instructions: RTC next week as scheduled  I discussed the assessment and treatment plan with the patient.  The patient was provided an opportunity to ask questions and all were answered.  The patient agreed with the plan and demonstrated understanding of the instructions.    The patient was  advised to call back or seek an in-person evaluation if the symptoms worsen or if the condition fails to improve as anticipated.    Landis Dowdy K Daaiyah Baumert, MD   I provided 20 minutes of non face-to-face telephone visit time during this encounter, and > 50% was spent counseling as documented under my assessment & plan.

## 2024-09-22 ENCOUNTER — Other Ambulatory Visit: Payer: Self-pay

## 2024-09-22 ENCOUNTER — Ambulatory Visit
Admission: RE | Admit: 2024-09-22 | Discharge: 2024-09-22 | Disposition: A | Source: Ambulatory Visit | Attending: Radiation Oncology | Admitting: Radiation Oncology

## 2024-09-22 ENCOUNTER — Ambulatory Visit
Admission: RE | Admit: 2024-09-22 | Discharge: 2024-09-22 | Disposition: A | Source: Ambulatory Visit | Attending: Radiation Oncology

## 2024-09-22 DIAGNOSIS — Z510 Encounter for antineoplastic radiation therapy: Secondary | ICD-10-CM | POA: Diagnosis not present

## 2024-09-22 LAB — RAD ONC ARIA SESSION SUMMARY
Course Elapsed Days: 4
Plan Fractions Treated to Date: 5
Plan Prescribed Dose Per Fraction: 3 Gy
Plan Total Fractions Prescribed: 10
Plan Total Prescribed Dose: 30 Gy
Reference Point Dosage Given to Date: 15 Gy
Reference Point Session Dosage Given: 3 Gy
Session Number: 5

## 2024-09-25 ENCOUNTER — Other Ambulatory Visit: Payer: Self-pay

## 2024-09-25 ENCOUNTER — Ambulatory Visit: Admission: RE | Admit: 2024-09-25 | Discharge: 2024-09-25 | Attending: Radiation Oncology

## 2024-09-25 ENCOUNTER — Ambulatory Visit
Admission: RE | Admit: 2024-09-25 | Discharge: 2024-09-25 | Disposition: A | Source: Ambulatory Visit | Attending: Radiation Oncology

## 2024-09-25 DIAGNOSIS — Z510 Encounter for antineoplastic radiation therapy: Secondary | ICD-10-CM | POA: Diagnosis not present

## 2024-09-25 LAB — RAD ONC ARIA SESSION SUMMARY
Course Elapsed Days: 7
Plan Fractions Treated to Date: 6
Plan Prescribed Dose Per Fraction: 3 Gy
Plan Total Fractions Prescribed: 10
Plan Total Prescribed Dose: 30 Gy
Reference Point Dosage Given to Date: 18 Gy
Reference Point Session Dosage Given: 3 Gy
Session Number: 6

## 2024-09-26 ENCOUNTER — Ambulatory Visit
Admission: RE | Admit: 2024-09-26 | Discharge: 2024-09-26 | Disposition: A | Source: Ambulatory Visit | Attending: Radiation Oncology

## 2024-09-26 ENCOUNTER — Other Ambulatory Visit: Payer: Self-pay

## 2024-09-26 DIAGNOSIS — Z510 Encounter for antineoplastic radiation therapy: Secondary | ICD-10-CM | POA: Diagnosis not present

## 2024-09-26 LAB — RAD ONC ARIA SESSION SUMMARY
Course Elapsed Days: 8
Plan Fractions Treated to Date: 7
Plan Prescribed Dose Per Fraction: 3 Gy
Plan Total Fractions Prescribed: 10
Plan Total Prescribed Dose: 30 Gy
Reference Point Dosage Given to Date: 21 Gy
Reference Point Session Dosage Given: 3 Gy
Session Number: 7

## 2024-09-27 ENCOUNTER — Ambulatory Visit
Admission: RE | Admit: 2024-09-27 | Discharge: 2024-09-27 | Disposition: A | Discharge status: Home / Self Care | Source: Ambulatory Visit | Attending: Radiation Oncology | Admitting: Radiation Oncology

## 2024-09-27 ENCOUNTER — Other Ambulatory Visit: Payer: Self-pay

## 2024-09-27 DIAGNOSIS — Z510 Encounter for antineoplastic radiation therapy: Secondary | ICD-10-CM | POA: Diagnosis not present

## 2024-09-27 LAB — RAD ONC ARIA SESSION SUMMARY
Course Elapsed Days: 9
Plan Fractions Treated to Date: 8
Plan Prescribed Dose Per Fraction: 3 Gy
Plan Total Fractions Prescribed: 10
Plan Total Prescribed Dose: 30 Gy
Reference Point Dosage Given to Date: 24 Gy
Reference Point Session Dosage Given: 3 Gy
Session Number: 8

## 2024-09-28 ENCOUNTER — Inpatient Hospital Stay: Admitting: Internal Medicine

## 2024-09-28 ENCOUNTER — Other Ambulatory Visit: Payer: Self-pay

## 2024-09-28 ENCOUNTER — Ambulatory Visit
Admission: RE | Admit: 2024-09-28 | Discharge: 2024-09-28 | Disposition: A | Discharge status: Home / Self Care | Source: Ambulatory Visit | Attending: Radiation Oncology | Admitting: Radiation Oncology

## 2024-09-28 VITALS — BP 137/92 | HR 81 | Temp 98.8°F | Resp 17 | Ht 66.0 in | Wt 121.0 lb

## 2024-09-28 DIAGNOSIS — C7931 Secondary malignant neoplasm of brain: Secondary | ICD-10-CM | POA: Diagnosis not present

## 2024-09-28 DIAGNOSIS — G96198 Other disorders of meninges, not elsewhere classified: Secondary | ICD-10-CM

## 2024-09-28 DIAGNOSIS — C50919 Malignant neoplasm of unspecified site of unspecified female breast: Secondary | ICD-10-CM | POA: Diagnosis not present

## 2024-09-28 DIAGNOSIS — Z510 Encounter for antineoplastic radiation therapy: Secondary | ICD-10-CM | POA: Diagnosis not present

## 2024-09-28 LAB — RAD ONC ARIA SESSION SUMMARY
Course Elapsed Days: 10
Plan Fractions Treated to Date: 9
Plan Prescribed Dose Per Fraction: 3 Gy
Plan Total Fractions Prescribed: 10
Plan Total Prescribed Dose: 30 Gy
Reference Point Dosage Given to Date: 27 Gy
Reference Point Session Dosage Given: 3 Gy
Session Number: 9

## 2024-09-28 MED ORDER — DULOXETINE HCL 30 MG PO CPEP: 30.0000 mg | ORAL_CAPSULE | Freq: Every day | ORAL | 2 refills | Status: DC

## 2024-09-28 NOTE — Progress Notes (Signed)
 [facility] at [facility] [address]  [place], [place] [address] [phone]   Interval Evaluation  Date of Service: 09/28/2024 Patient Name: Emily Hayes Patient MRN: [id number] Patient DOB: [birth year] Provider: Arthea MARLA Manns, MD  Identifying Statement:  Emily Hayes is a 73 y.o. female with Malignant neoplasm of breast metastatic to brain, unspecified laterality [facility])  Leptomeningeal disease   Primary Cancer:  Oncologic History: Oncology History  Malignant neoplasm of upper-outer quadrant of right breast in female, estrogen receptor positive (HCC)  07/07/2024 Mammogram   Highly suspicious 1.9 cm irregular mass in the right breast at 10 o'clock, 6 cm the nipple, with associated mammographic architectural distortion. 2. Suspicious calcifications in the lateral and upper outer right breast spanning at least 5 cm from anterior to posterior. 3. Likely benign 1 mm group of calcifications in the medial right breast, only visualized discretely on the cc view.   07/26/2024 Pathology Results   1. Breast, right, needle core biopsy, 10:00 6 cmfn (clip heart) :      INVASIVE MODERATELY DIFFERENTIATED DUCTAL ADENOCARCINOMA, GRADE 2 (3+2+1)      TUBULE FORMATION: SCORE 3      NUCLEAR PLEOMORPHISM: SCORE 2      MITOTIC COUNT: SCORE 1      TOTAL SCORE: 6      OVERALL GRADE: GRADE 2 (6/9)      NEGATIVE FOR ANGIOLYMPHATIC INVASION      TUMOR MEASURES 13 MM IN GREATEST LINEAR EXTENT      ADJACENT FIBROCYSTIC CHANGES INCLUDING STROMAL FIBROSIS AND ADENOSIS      MICROCALCIFICATIONS PRESENT WITHIN BENIGN GNOSIS       2. Breast, right, needle core biopsy, calcifications, central, (clip x) :      FOCAL DUCTAL CARCINOMA IN SITU, INTERMEDIATE NUCLEAR GRADE, CRIBRIFORM AND SOLID      TYPES WITHOUT NECROSIS      NEGATIVE FOR INVASIVE CARCINOMA      DCIS MEASURES 4 MM IN GREATEST LINEAR EXTENT      EXTENSIVE STROMAL FIBROSIS WITH FOCAL USUAL DUCT  HYPERPLASIA      MICROCALCIFICATIONS PRESENT WITHIN DCIS AND USUAL DUCT HYPERPLASIA    07/28/2024 Initial Diagnosis   Malignant neoplasm of upper-outer quadrant of right breast in female, estrogen receptor positive (HCC)   08/02/2024 Cancer Staging   Staging form: Breast, AJCC 8th Edition - Clinical: Stage IA (cT1c, cN0, cM0, G2, ER+, PR-, HER2-) - Signed by Loretha Ash, MD on 08/02/2024 Histologic grading system: 3 grade system    Genetic Testing   Negative [facility] BRCAPlus + RNA (13 gene) panel. Report date 08/11/2024. 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 There is currently no evidence to suggest a significantly increased cancer risk for carriers of MUTYH pathogenic variants. Report date 08/14/2024.    CNS Oncologic History 09/22/24: Completes SRS to 6 brain metastases [facility]) 09/29/24: Completes 2 week external beam RT to lumbar spine disease [facility])  Interval History: Emily Hayes presents today for follow up, now having nearly completed spine radiation.  Her brain treatments were completed last week.  She has done well overall with all radiation.  Still has pain in her lower back and radiating down the back of her left leg.  This is improved modestly with Lyrica , although she feels better on the 75mg  dose, did not tolerate the 150mg  dose level.  Has bronchoscopy scheduled for next week, then follow up with Dr. Loretha for systemic therapy  recommendations.  H+P (09/05/24) Patient presents today to review recent CNS findings.  She describes several weeks history of midline low back pain, with radiating pain down the back of her left leg.  Overall burden of symptoms is progressive in nature.  Pain is more severe when she coughs or strains.  She is able to walk normally, no weakness described.  Very little numbness as well.  Denies any focal weakness, speech difficulty, imbalance, seizures or headaches.  Continues to follow  with Dr. Loretha for breast cancer treatments and workup.     Medications: Current Outpatient Medications on File Prior to Visit  Medication Sig Dispense Refill   B Complex-Folic Acid (B COMPLEX-VITAMIN B12 PO) Take 1 tablet by mouth daily.     Calcium  Citrate-Vitamin D (CALCIUM  CITRATE + D PO) Take 1 tablet by mouth 2 (two) times daily.     diphenhydrAMINE (BENADRYL) 25 MG tablet Take 25 mg by mouth as needed.      losartan  (COZAAR ) 25 MG tablet Take 1 tablet (25 mg total) by mouth daily. 90 tablet 1   Magnesium Carbonate, Antacid, (MAGNESIUM CARBONATE PO) Take 240 mg by mouth 2 (two) times daily.     ondansetron  (ZOFRAN ) 8 MG tablet Take 1 tablet (8 mg total) by mouth every 8 (eight) hours as needed for nausea or vomiting. 20 tablet 3   pregabalin  (LYRICA ) 150 MG capsule Take 1 capsule (150 mg total) by mouth 2 (two) times daily. 60 capsule 2   rosuvastatin  (CRESTOR ) 40 MG tablet Take 1 tablet (40 mg total) by mouth daily. 90 tablet 1   Simethicone (GAS RELIEF EXTRA STRENGTH PO) Take 1 capsule by mouth as needed.     vitamin E 400 UNIT capsule Take 1,200 Units by mouth daily.      No current facility-administered medications on file prior to visit.    Allergies: No Known Allergies Past Medical History:  Past Medical History:  Diagnosis Date   Cervical radiculopathy    Family history of cancer    Family history of lung cancer    Fatigue    Generalized osteoarthrosis    HTN (hypertension)    Hyperlipidemia    Lateral epicondylitis of left elbow 06/28/2017   Left knee pain    Menopause    Neck pain    Ovarian cancer (HCC) 1983   Pneumonia    Pre-diabetes    Sciatica    Swelling of left knee joint    Past Surgical History:  Past Surgical History:  Procedure Laterality Date   ABCESS DRAINAGE Left 09/15/2017   behind tonsil   ABDOMINAL HYSTERECTOMY  1983   APPENDECTOMY     BREAST BIOPSY Right 07/26/2024   US  RT BREAST BX W LOC DEV 1ST LESION IMG BX SPEC US  GUIDE 07/26/2024  [facility]   BREAST BIOPSY Right 07/26/2024   MM RT BREAST BX W LOC DEV 1ST LESION IMAGE BX SPEC STEREO GUIDE 07/26/2024 [facility]   EXCISION METACARPAL MASS Right 12/29/2023   Procedure: EXCISION METACARPAL MASS;  Surgeon: Jerri Kay HERO, MD;  Location: [facility];  Service: Orthopedics;  Laterality: Right;  right thumb and long finger cyst removal   Social History:  Social History   Socioeconomic History   Marital status: Divorced    Spouse name: Not on file   Number of children: 2   Years of education: Not on file   Highest education level: Associate degree: academic program  Occupational History   Occupation: retired  Tobacco Use   Smoking status: Every Day    Current packs/day: 0.50    Average packs/day: 0.5 packs/day for 50.0 years (25.0 ttl pk-yrs)    Types: Cigarettes   Smokeless tobacco: Never   Tobacco comments:    Started smoking at age 89.  6 cigarettes a day. 09/11/2024.  Vaping Use   Vaping status: Never Used  Substance and Sexual Activity   Alcohol use: No   Drug use: No   Sexual activity: Not Currently    Birth control/protection: Surgical    Comment: Hyst  Other Topics Concern   Not on file  Social History Narrative   Social History      Diet? none      Do you drink/eat things with caffeine? Chocolate- occasionally      Marital status?        divorced                            What year were you married? 1982      Do you live in a house, apartment, assisted living, condo, trailer, etc.? apartment      Is it one or more stories? 1      How many persons live in your home? 1       Do you have any pets in your home? (please list) yes, dog      Highest level of education completed? Associate degree- college      Current or past profession: retired      Do you exercise?           yes                           Type & how often? Walk- daily      Advanced Directives      Do you have a living will? yes      Do you  have a DNR form?     yes                             If not, do you want to discuss one? yes      Do you have signed POA/HPOA for forms? no      Functional Status      Do you have difficulty bathing or dressing yourself?      Do you have difficulty preparing food or eating?       Do you have difficulty managing your medications?      Do you have difficulty managing your finances?      Do you have difficulty affording your medications?   Social Drivers of Health   Tobacco Use: High Risk (09/11/2024)   Patient History    Smoking Tobacco Use: Every Day    Smokeless Tobacco Use: Never    Passive Exposure: Not on file  Financial Resource Strain: Low Risk (05/29/2024)   Overall Financial Resource Strain (CARDIA)    Difficulty of Paying Living Expenses: Not hard at all  Food Insecurity: No Food Insecurity (09/05/2024)   Epic    Worried About Programme Researcher, Broadcasting/film/video in the Last Year: Never true    Ran Out of Food in the Last Year: Never true  Transportation Needs: No Transportation Needs (09/05/2024)   Epic    Lack of Transportation (Medical): No    Lack of Transportation (  Non-Medical): No  Physical Activity: Sufficiently Active (10/01/2023)   Exercise Vital Sign    Days of Exercise per Week: 7 days    Minutes of Exercise per Session: 40 min  Stress: No Stress Concern Present (10/01/2023)   [employer] of Occupational Health - Occupational Stress Questionnaire    Feeling of Stress : Not at all  Social Connections: Socially Isolated (05/29/2024)   Social Connection and Isolation Panel    Frequency of Communication with Friends and Family: Three times a week    Frequency of Social Gatherings with Friends and Family: Twice a week    Attends Religious Services: Patient declined    Active Member of Clubs or Organizations: No    Attends Engineer, Structural: Not on file    Marital Status: Divorced  Intimate Partner Violence: Not At Risk (09/05/2024)   Epic    Fear of  Current or Ex-Partner: No    Emotionally Abused: No    Physically Abused: No    Sexually Abused: No  Depression (PHQ2-9): Low Risk (09/05/2024)   Depression (PHQ2-9)    PHQ-2 Score: 0  Alcohol Screen: Low Risk (05/29/2024)   Alcohol Screen    Last Alcohol Screening Score (AUDIT): 1  Housing: Low Risk (09/05/2024)   Epic    Unable to Pay for Housing in the Last Year: No    Number of Times Moved in the Last Year: 0    Homeless in the Last Year: No  Recent Concern: Housing - High Risk (09/01/2024)   Epic    Unable to Pay for Housing in the Last Year: Yes    Number of Times Moved in the Last Year: 0    Homeless in the Last Year: No  Utilities: Not At Risk (09/05/2024)   Epic    Threatened with loss of utilities: No  Health Literacy: Not on file   Family History:  Family History  Problem Relation Age of Onset   Stroke Mother    Diabetes Mother    Dementia Mother    Alzheimer's disease Mother    Cancer Sister        unknown   Cancer Sister        unknown   Cancer Sister        unknown   Cancer Sister        brain tumor   Colon polyps Brother    Hypertension Brother    Hyperlipidemia Brother    Kidney failure Brother    Lung cancer Brother    Colon cancer Neg Hx    Esophageal cancer Neg Hx    Rectal cancer Neg Hx    Stomach cancer Neg Hx     Review of Systems: Constitutional: Doesn't report fevers, chills or abnormal weight loss Eyes: Doesn't report blurriness of vision Ears, nose, mouth, throat, and face: Doesn't report sore throat Respiratory: Doesn't report cough, dyspnea or wheezes Cardiovascular: Doesn't report palpitation, chest discomfort  Gastrointestinal:  Doesn't report nausea, constipation, diarrhea GU: Doesn't report incontinence Skin: Doesn't report skin rashes Neurological: Per HPI Musculoskeletal: Doesn't report joint pain Behavioral/Psych: Doesn't report anxiety  Physical Exam: Vitals:   09/28/24 1218  BP: (!) 137/92  Pulse: 81  Resp: 17   Temp: 98.8 F (37.1 C)  SpO2: 100%   KPS: 80. General: Alert, cooperative, pleasant, in no acute distress Head: Normal EENT: No conjunctival injection or scleral icterus.  Lungs: Resp effort normal Cardiac: Regular rate Abdomen: Non-distended abdomen Skin: No rashes cyanosis or petechiae.  Extremities: No clubbing or edema  Neurologic Exam: Mental Status: Awake, alert, attentive to examiner. Oriented to self and environment. Language is fluent with intact comprehension.  Cranial Nerves: Visual acuity is grossly normal. Visual fields are full. Extra-ocular movements intact. No ptosis. Face is symmetric Motor: Tone and bulk are normal. Power is full in both arms and legs. Reflexes are symmetric, no pathologic reflexes present.  Sensory: Intact to light touch Gait: Normal.   Labs: I have reviewed the data as listed    Component Value Date/Time   NA 140 08/02/2024 1255   NA 141 01/19/2017 0000   NA 141 01/19/2017 0000   K 4.3 08/02/2024 1255   CL 103 08/02/2024 1255   CO2 32 08/02/2024 1255   GLUCOSE 128 (H) 08/02/2024 1255   BUN 15 08/02/2024 1255   BUN 17 01/19/2017 0000   BUN 17 01/19/2017 0000   CREATININE 0.73 08/02/2024 1255   CREATININE 0.81 05/30/2024 0840   CALCIUM  10.1 08/02/2024 1255   PROT 7.0 08/02/2024 1255   ALBUMIN 4.5 08/02/2024 1255   AST 22 08/02/2024 1255   ALT 15 08/02/2024 1255   ALKPHOS 54 08/02/2024 1255   BILITOT 0.3 08/02/2024 1255   GFRNONAA >60 08/02/2024 1255   GFRNONAA 80 12/13/2020 0000   GFRAA 93 12/13/2020 0000   Lab Results  Component Value Date   WBC 6.6 08/02/2024   NEUTROABS 3.3 08/02/2024   HGB 11.2 (L) 08/02/2024   HCT 34.9 (L) 08/02/2024   MCV 83.9 08/02/2024   PLT 239 08/02/2024    Assessment/Plan Malignant neoplasm of breast metastatic to brain, unspecified laterality (HCC)  Leptomeningeal disease  Emily Hayes presents with clinical syndrome localizing to lumbosacral nerve roots; etiology is leptomeningeal  dissemination of suspected primary breast cancer.  CNS staging demonstrated 3 nodular lesions within L2-L4 nerve roots, as well as larger metastases within left anterior temporal lobe and right mesial temporal lobe.  The brain lesions are not currently causing focal deficits at this time.  Lumbar spine RT is scheduled to complete tomorrow.  For neuropathic pain, agreeable with continuing Lyrica  75mg  BID, 150mg  dose was poorly tolerated.  Given some refractory symptoms, she is interested in trial of Cymbalta 30mg  daily as well, we will order this.  We appreciate the opportunity to participate in the care of Emily Hayes.    We ask that Emily Hayes return to clinic in 3 months following next brain and lumbar MRI, or sooner as needed.  All questions were answered. The patient knows to call the clinic with any problems, questions or concerns. No barriers to learning were detected.  The total time spent in the encounter was 40 minutes and more than 50% was on counseling and review of test results   Arthea MARLA Manns, MD Medical Director of Neuro-Oncology [facility] at [facility] 09/28/2024 12:25 PM

## 2024-09-29 ENCOUNTER — Encounter (HOSPITAL_COMMUNITY): Payer: Self-pay | Admitting: Emergency Medicine

## 2024-09-29 ENCOUNTER — Other Ambulatory Visit: Payer: Self-pay

## 2024-09-29 ENCOUNTER — Ambulatory Visit
Admission: RE | Admit: 2024-09-29 | Discharge: 2024-09-29 | Disposition: A | Source: Ambulatory Visit | Attending: Radiation Oncology

## 2024-09-29 ENCOUNTER — Encounter: Payer: Self-pay | Admitting: Hematology and Oncology

## 2024-09-29 DIAGNOSIS — Z510 Encounter for antineoplastic radiation therapy: Secondary | ICD-10-CM | POA: Diagnosis not present

## 2024-09-29 LAB — RAD ONC ARIA SESSION SUMMARY
Course Elapsed Days: 11
Plan Fractions Treated to Date: 10
Plan Prescribed Dose Per Fraction: 3 Gy
Plan Total Fractions Prescribed: 10
Plan Total Prescribed Dose: 30 Gy
Reference Point Dosage Given to Date: 30 Gy
Reference Point Session Dosage Given: 3 Gy
Session Number: 10

## 2024-09-29 NOTE — Progress Notes (Signed)
 SDW CALL  Patient was given pre-op instructions over the phone. The opportunity was given for the patient to ask questions. No further questions asked. Patient verbalized understanding of instructions given.   PCP - Elveria Kaiser Cardiologist - denies  PPM/ICD - denies Device Orders - n/a Rep Notified - n/a  Chest x-ray -  EKG - 12/27/23 Stress Test - denies ECHO - denies Cardiac Cath - denies  Sleep Study - denies   Pre-diabetes but does not check blood sugar.   Last dose of GLP1 agonist-  n/a GLP1 instructions:  n/a  Blood Thinner Instructions: n/a Aspirin  Instructions: n/a  ERAS Protcol - NPO PRE-SURGERY Ensure or G2- n/a  COVID TEST- n/a   Anesthesia review: no  Patient denies shortness of breath, fever, cough and chest pain over the phone call   All instructions explained to the patient, with a verbal understanding of the material. Patient agrees to go over the instructions while at home for a better understanding.

## 2024-10-02 ENCOUNTER — Ambulatory Visit (HOSPITAL_COMMUNITY)

## 2024-10-02 ENCOUNTER — Ambulatory Visit (HOSPITAL_COMMUNITY)
Admission: RE | Admit: 2024-10-02 | Discharge: 2024-10-02 | Disposition: A | Attending: Emergency Medicine | Admitting: Emergency Medicine

## 2024-10-02 ENCOUNTER — Ambulatory Visit (HOSPITAL_COMMUNITY): Payer: Self-pay | Admitting: Anesthesiology

## 2024-10-02 ENCOUNTER — Encounter (HOSPITAL_COMMUNITY): Admission: RE | Disposition: A | Payer: Self-pay | Source: Home / Self Care | Attending: Emergency Medicine

## 2024-10-02 ENCOUNTER — Encounter: Payer: Self-pay | Admitting: *Deleted

## 2024-10-02 ENCOUNTER — Encounter (HOSPITAL_COMMUNITY): Payer: Self-pay | Admitting: Emergency Medicine

## 2024-10-02 ENCOUNTER — Other Ambulatory Visit: Payer: Self-pay

## 2024-10-02 DIAGNOSIS — Z833 Family history of diabetes mellitus: Secondary | ICD-10-CM | POA: Diagnosis not present

## 2024-10-02 DIAGNOSIS — C3411 Malignant neoplasm of upper lobe, right bronchus or lung: Secondary | ICD-10-CM | POA: Insufficient documentation

## 2024-10-02 DIAGNOSIS — R918 Other nonspecific abnormal finding of lung field: Secondary | ICD-10-CM | POA: Diagnosis present

## 2024-10-02 DIAGNOSIS — F17210 Nicotine dependence, cigarettes, uncomplicated: Secondary | ICD-10-CM | POA: Insufficient documentation

## 2024-10-02 DIAGNOSIS — M1990 Unspecified osteoarthritis, unspecified site: Secondary | ICD-10-CM | POA: Insufficient documentation

## 2024-10-02 DIAGNOSIS — C3432 Malignant neoplasm of lower lobe, left bronchus or lung: Secondary | ICD-10-CM | POA: Diagnosis not present

## 2024-10-02 DIAGNOSIS — Z8543 Personal history of malignant neoplasm of ovary: Secondary | ICD-10-CM | POA: Diagnosis not present

## 2024-10-02 DIAGNOSIS — Z79899 Other long term (current) drug therapy: Secondary | ICD-10-CM | POA: Diagnosis not present

## 2024-10-02 DIAGNOSIS — C7951 Secondary malignant neoplasm of bone: Secondary | ICD-10-CM | POA: Diagnosis not present

## 2024-10-02 DIAGNOSIS — Z800 Family history of malignant neoplasm of digestive organs: Secondary | ICD-10-CM | POA: Insufficient documentation

## 2024-10-02 DIAGNOSIS — C50911 Malignant neoplasm of unspecified site of right female breast: Secondary | ICD-10-CM | POA: Insufficient documentation

## 2024-10-02 DIAGNOSIS — R911 Solitary pulmonary nodule: Secondary | ICD-10-CM | POA: Diagnosis not present

## 2024-10-02 DIAGNOSIS — Z791 Long term (current) use of non-steroidal anti-inflammatories (NSAID): Secondary | ICD-10-CM | POA: Insufficient documentation

## 2024-10-02 DIAGNOSIS — I10 Essential (primary) hypertension: Secondary | ICD-10-CM | POA: Insufficient documentation

## 2024-10-02 DIAGNOSIS — E785 Hyperlipidemia, unspecified: Secondary | ICD-10-CM | POA: Insufficient documentation

## 2024-10-02 DIAGNOSIS — R7303 Prediabetes: Secondary | ICD-10-CM | POA: Insufficient documentation

## 2024-10-02 DIAGNOSIS — C3431 Malignant neoplasm of lower lobe, right bronchus or lung: Secondary | ICD-10-CM | POA: Insufficient documentation

## 2024-10-02 DIAGNOSIS — C3412 Malignant neoplasm of upper lobe, left bronchus or lung: Secondary | ICD-10-CM | POA: Insufficient documentation

## 2024-10-02 DIAGNOSIS — C7931 Secondary malignant neoplasm of brain: Secondary | ICD-10-CM | POA: Insufficient documentation

## 2024-10-02 DIAGNOSIS — Z01818 Encounter for other preprocedural examination: Secondary | ICD-10-CM

## 2024-10-02 DIAGNOSIS — Z801 Family history of malignant neoplasm of trachea, bronchus and lung: Secondary | ICD-10-CM | POA: Diagnosis not present

## 2024-10-02 DIAGNOSIS — E1169 Type 2 diabetes mellitus with other specified complication: Secondary | ICD-10-CM

## 2024-10-02 HISTORY — PX: BRONCHIAL BRUSHINGS: SHX5108

## 2024-10-02 HISTORY — PX: CRYOTHERAPY: SHX6894

## 2024-10-02 HISTORY — PX: BRONCHIAL NEEDLE ASPIRATION BIOPSY: SHX5106

## 2024-10-02 HISTORY — PX: VIDEO BRONCHOSCOPY WITH ENDOBRONCHIAL NAVIGATION: SHX6175

## 2024-10-02 LAB — BASIC METABOLIC PANEL WITH GFR
Anion gap: 8 (ref 5–15)
BUN: 12 mg/dL (ref 8–23)
CO2: 28 mmol/L (ref 22–32)
Calcium: 9.5 mg/dL (ref 8.9–10.3)
Chloride: 103 mmol/L (ref 98–111)
Creatinine, Ser: 0.72 mg/dL (ref 0.44–1.00)
GFR, Estimated: >60 mL/min (ref >60)
Glucose, Bld: 101 mg/dL — ABNORMAL HIGH (ref 70–99)
Potassium: 4 mmol/L (ref 3.5–5.1)
Sodium: 139 mmol/L (ref 135–145)

## 2024-10-02 LAB — CBC
HCT: 37.8 % (ref 36.0–46.0)
Hemoglobin: 11.9 g/dL — ABNORMAL LOW (ref 12.0–15.0)
MCH: 27.4 pg (ref 26.0–34.0)
MCHC: 31.5 g/dL (ref 30.0–36.0)
MCV: 86.9 fL (ref 80.0–100.0)
Platelets: 204 K/uL (ref 150–400)
RBC: 4.35 MIL/uL (ref 3.87–5.11)
RDW: 16.3 % — ABNORMAL HIGH (ref 11.5–15.5)
WBC: 3.5 K/uL — ABNORMAL LOW (ref 4.0–10.5)
nRBC: 0 % (ref 0.0–0.2)

## 2024-10-02 SURGERY — VIDEO BRONCHOSCOPY WITH ENDOBRONCHIAL NAVIGATION
Anesthesia: General | Laterality: Bilateral

## 2024-10-02 MED ORDER — PROPOFOL 10 MG/ML IV BOLUS
INTRAVENOUS | Status: DC | PRN
  Administered 2024-10-02: 13:00:00 25 mg via INTRAVENOUS
  Administered 2024-10-02: 11:00:00 100 ug/kg/min via INTRAVENOUS
  Administered 2024-10-02: 13:00:00 25 mg via INTRAVENOUS
  Administered 2024-10-02: 11:00:00 120 mg via INTRAVENOUS
  Administered 2024-10-02: 12:00:00 25 mg via INTRAVENOUS

## 2024-10-02 MED ORDER — ACETAMINOPHEN 500 MG PO TABS
1000.0000 mg | ORAL_TABLET | Freq: Once | ORAL | Status: AC
  Administered 2024-10-02: 09:00:00 1000 mg via ORAL
  Filled 2024-10-02: qty 2

## 2024-10-02 MED ORDER — FENTANYL CITRATE (PF) 250 MCG/5ML IJ SOLN
INTRAMUSCULAR | Status: DC | PRN
  Administered 2024-10-02 (×2): 50 ug via INTRAVENOUS

## 2024-10-02 MED ORDER — PREGABALIN 150 MG PO CAPS: 150.0000 mg | ORAL_CAPSULE | Freq: Two times a day (BID) | ORAL | Status: AC

## 2024-10-02 MED ORDER — PHENYLEPHRINE 80 MCG/ML (10ML) SYRINGE FOR IV PUSH (FOR BLOOD PRESSURE SUPPORT)
PREFILLED_SYRINGE | INTRAVENOUS | Status: DC | PRN
  Administered 2024-10-02 (×3): 160 ug via INTRAVENOUS

## 2024-10-02 MED ORDER — CHLORHEXIDINE GLUCONATE 0.12 % MT SOLN
OROMUCOSAL | Status: AC
  Filled 2024-10-02: qty 15

## 2024-10-02 MED ORDER — OXYCODONE HCL 5 MG PO TABS: 5.0000 mg | ORAL_TABLET | Freq: Once | ORAL | Status: DC | PRN

## 2024-10-02 MED ORDER — OXYCODONE HCL 5 MG/5ML PO SOLN: 5.0000 mg | Freq: Once | ORAL | Status: DC | PRN

## 2024-10-02 MED ORDER — ONDANSETRON HCL 4 MG/2ML IJ SOLN
INTRAMUSCULAR | Status: DC | PRN
  Administered 2024-10-02: 11:00:00 4 mg via INTRAVENOUS

## 2024-10-02 MED ORDER — LACTATED RINGERS IV SOLN: INTRAVENOUS | Status: DC

## 2024-10-02 MED ORDER — CHLORHEXIDINE GLUCONATE 0.12 % MT SOLN
15.0000 mL | Freq: Once | OROMUCOSAL | Status: AC
  Administered 2024-10-02: 09:00:00 15 mL via OROMUCOSAL

## 2024-10-02 MED ORDER — LIDOCAINE 2% (20 MG/ML) 5 ML SYRINGE
INTRAMUSCULAR | Status: DC | PRN
  Administered 2024-10-02: 11:00:00 60 mg via INTRAVENOUS

## 2024-10-02 MED ORDER — SUGAMMADEX SODIUM 200 MG/2ML IV SOLN
INTRAVENOUS | Status: DC | PRN
  Administered 2024-10-02: 13:00:00 200 mg via INTRAVENOUS

## 2024-10-02 MED ORDER — DEXAMETHASONE SOD PHOSPHATE PF 10 MG/ML IJ SOLN
INTRAMUSCULAR | Status: DC | PRN
  Administered 2024-10-02: 11:00:00 5 mg via INTRAVENOUS

## 2024-10-02 MED ORDER — ROCURONIUM BROMIDE 10 MG/ML (PF) SYRINGE
PREFILLED_SYRINGE | INTRAVENOUS | Status: DC | PRN
  Administered 2024-10-02: 11:00:00 50 mg via INTRAVENOUS
  Administered 2024-10-02: 12:00:00 20 mg via INTRAVENOUS
  Administered 2024-10-02 (×2): 10 mg via INTRAVENOUS

## 2024-10-02 MED ORDER — AMISULPRIDE (ANTIEMETIC) 5 MG/2ML IV SOLN: 10.0000 mg | Freq: Once | INTRAVENOUS | Status: DC | PRN

## 2024-10-02 MED ORDER — FENTANYL CITRATE (PF) 100 MCG/2ML IJ SOLN
INTRAMUSCULAR | Status: AC
  Filled 2024-10-02: qty 2

## 2024-10-02 MED ORDER — FENTANYL CITRATE (PF) 100 MCG/2ML IJ SOLN: 25.0000 ug | INTRAMUSCULAR | Status: DC | PRN

## 2024-10-02 SURGICAL SUPPLY — 36 items
ADAPTER BRONCHOSCOPE OLYMPUS (ADAPTER) ×2 IMPLANT
ADAPTER VALVE BIOPSY EBUS (MISCELLANEOUS) IMPLANT
BAG COUNTER SPONGE SURGICOUNT (BAG) ×2 IMPLANT
BRUSH CYTOL CELLEBRITY 1.5X140 (MISCELLANEOUS) ×2 IMPLANT
BRUSH SUPERTRAX BIOPSY (INSTRUMENTS) IMPLANT
BRUSH SUPERTRAX NDL-TIP CYTO (INSTRUMENTS) ×2 IMPLANT
CANISTER SUCTION 3000ML PPV (SUCTIONS) ×2 IMPLANT
CNTNR URN SCR LID CUP LEK RST (MISCELLANEOUS) ×2 IMPLANT
COVER BACK TABLE 60X90IN (DRAPES) ×2 IMPLANT
FILTER STRAW FLUID ASPIR (MISCELLANEOUS) IMPLANT
FORCEPS BIOP 1.5 SINGLE USE (MISCELLANEOUS) ×2 IMPLANT
FORCEPS BIOP SUPERTRX PREMAR (INSTRUMENTS) ×2 IMPLANT
GAUZE SPONGE 4X4 12PLY STRL (GAUZE/BANDAGES/DRESSINGS) ×2 IMPLANT
GLOVE BIO SURGEON STRL SZ7.5 (GLOVE) ×4 IMPLANT
GOWN STRL REUS W/ TWL LRG LVL3 (GOWN DISPOSABLE) ×4 IMPLANT
KIT CLEAN ENDO COMPLIANCE (KITS) ×2 IMPLANT
KIT LOCATABLE GUIDE (CANNULA) IMPLANT
KIT MARKER FIDUCIAL DELIVERY (KITS) IMPLANT
KIT TURNOVER KIT B (KITS) ×2 IMPLANT
MARKER SKIN DUAL TIP RULER LAB (MISCELLANEOUS) ×2 IMPLANT
NDL SUPERTRX PREMARK BIOPSY (NEEDLE) ×2 IMPLANT
NEEDLE SUPERTRX PREMARK BIOPSY (NEEDLE) ×2 IMPLANT
OIL SILICONE PENTAX (PARTS (SERVICE/REPAIRS)) ×2 IMPLANT
PAD ARMBOARD POSITIONER FOAM (MISCELLANEOUS) ×4 IMPLANT
PATCHES PATIENT (LABEL) ×6 IMPLANT
SOLN 0.9% NACL POUR BTL 1000ML (IV SOLUTION) ×2 IMPLANT
SOLN STERILE WATER BTL 1000 ML (IV SOLUTION) ×2 IMPLANT
SYR 20ML ECCENTRIC (SYRINGE) ×2 IMPLANT
SYR 20ML LL LF (SYRINGE) ×2 IMPLANT
SYR 50ML SLIP (SYRINGE) ×2 IMPLANT
TOWEL GREEN STERILE FF (TOWEL DISPOSABLE) ×2 IMPLANT
TRAP SPECIMEN MUCUS 40CC (MISCELLANEOUS) IMPLANT
TUBE CONNECTING 20X1/4 (TUBING) ×2 IMPLANT
UNDERPAD 30X36 HEAVY ABSORB (UNDERPADS AND DIAPERS) ×2 IMPLANT
VALVE BIOPSY SINGLE USE (MISCELLANEOUS) ×2 IMPLANT
VALVE SUCTION BRONCHIO DISP (MISCELLANEOUS) ×2 IMPLANT

## 2024-10-02 NOTE — Interval H&P Note (Signed)
 History and Physical Interval Note:  10/02/2024 10:45 AM  Emily Hayes  has presented today for surgery, with the diagnosis of metastatic breast cancer.  The various methods of treatment have been discussed with the patient and family. After consideration of risks, benefits and other options for treatment, the patient has consented to  Procedures: VIDEO BRONCHOSCOPY WITH ENDOBRONCHIAL NAVIGATION (Bilateral) as a surgical intervention.  The patient's history has been reviewed, patient examined, no change in status, stable for surgery.  I have reviewed the patient's chart and labs.  Questions were answered to the patient's satisfaction.     Lamar GORMAN Chris

## 2024-10-02 NOTE — Op Note (Signed)
 Video Bronchoscopy with Robotic Assisted Bronchoscopic Navigation   Date of Operation: 10/02/2024   Pre-op Diagnosis: Bilateral pulmonary nodules  Post-op Diagnosis: Same  Surgeon: Lamar Chris  Assistants: Dipti Baral  Anesthesia: General endotracheal anesthesia  Operation: Flexible video fiberoptic bronchoscopy with robotic assistance and biopsies.  Estimated Blood Loss: Minimal  Complications: None  Indications and History: Emily Hayes is a 73 y.o. female with history of tobacco use and new diagnosis of breast cancer she has bilateral pulmonary nodules on chest imaging some of which are solid, some mixed density.  Recommendation made to achieve a tissue diagnosis via robotic assisted navigational bronchoscopy.  The risks, benefits, complications, treatment options and expected outcomes were discussed with the patient.  The possibilities of pneumothorax, pneumonia, reaction to medication, pulmonary aspiration, perforation of a viscus, bleeding, failure to diagnose a condition and creating a complication requiring transfusion or operation were discussed with the patient who freely signed the consent.    Description of Procedure: The patient was seen in the Preoperative Area, was examined and was deemed appropriate to proceed.  The patient was taken to [facility] Endoscopy room 3, identified as Emily Hayes and the procedure verified as Flexible Video Fiberoptic Bronchoscopy.  A Time Out was held and the above information confirmed.   Prior to the date of the procedure a high-resolution CT scan of the chest was performed. Utilizing ION software program a virtual tracheobronchial tree was generated to allow the creation of distinct navigation pathways to the patient's parenchymal abnormalities. After being taken to the operating room general anesthesia was initiated and the patient  was orally intubated. The video fiberoptic bronchoscope was introduced via the endotracheal tube and a  general inspection was performed which showed normal right and left lung anatomy. Aspiration of the bilateral mainstems was completed to remove any remaining secretions. Robotic catheter inserted into patient's endotracheal tube.   Target #1 right upper lobe mixed density nodule: The distinct navigation pathways prepared prior to this procedure were then utilized to navigate to patient's lesion identified on CT scan. The robotic catheter was secured into place and the vision probe was withdrawn.  Lesion location was approximated using fluoroscopy.  Local registration and targeting was performed using [employer] Cios mobile C-arm three-dimensional imaging. Under fluoroscopic guidance transbronchial brushings, transbronchial needle biopsies, and transbronchial cryoprobe biopsies were performed to be sent for cytology and pathology.  Needle-in-lesion was confirmed using Cios mobile C-arm.    Target #2 left upper lobe pulmonary nodule: The distinct navigation pathways prepared prior to this procedure were then utilized to navigate to patient's lesion identified on CT scan. The robotic catheter was secured into place and the vision probe was withdrawn.  Lesion location was approximated using fluoroscopy.  Local registration and targeting was performed using [employer] Cios mobile C-arm three-dimensional imaging. Under fluoroscopic guidance transbronchial needle biopsies and transbronchial cryoprobe biopsies were performed to be sent for cytology and pathology.  Needle-in-lesion was confirmed using Cios mobile C-arm.    Target #3 right lower lobe superior segment nodule: The distinct navigation pathways prepared prior to this procedure were then utilized to navigate to patient's lesion identified on CT scan. The robotic catheter was secured into place and the vision probe was withdrawn.  Lesion location was approximated using fluoroscopy.  Local registration and targeting was performed using  [employer] Cios mobile C-arm three-dimensional imaging. Under fluoroscopic guidance transbronchial needle biopsies and transbronchial cryoprobe biopsies were performed to be sent for cytology and pathology.  Needle-in-lesion was  confirmed using Cios mobile C-arm.    At the end of the procedure a general airway inspection was performed and there was no evidence of active bleeding. The bronchoscope was removed.  The patient tolerated the procedure well. There was no significant blood loss and there were no obvious complications. A post-procedural chest x-ray is pending.  Samples Target #1: 1. Transbronchial brushings from right upper lobe nodule 2. Transbronchial Wang needle biopsies from right upper lobe nodule 3. Transbronchial cryoprobe biopsies from right upper lobe nodule  Samples Target #2: 1. Transbronchial Wang needle biopsies from left upper lobe nodule 2. Transbronchial forceps cryoprobe from left upper lobe nodule  Samples Target #3: 1. Transbronchial Wang needle biopsies from right lower lobe superior segment nodule 2. Transbronchial forceps biopsies from right lower lobe superior segment nodule   Plans:  The patient will be discharged from the PACU to home when recovered from anesthesia and after chest x-ray is reviewed. We will review the cytology, pathology and microbiology results with the patient when they become available. Outpatient followup will be with Dr Pleas.   Lamar Chris, MD, PhD 10/02/2024, 12:46 PM [facility] [phone] or if no answer before 7:00PM call [phone] For any issues after 7:00PM please call eLink [phone]

## 2024-10-02 NOTE — Discharge Instructions (Addendum)

## 2024-10-02 NOTE — Transfer of Care (Signed)
 Immediate Anesthesia Transfer of Care Note  Patient: Emily Hayes  Procedure(s) Performed: VIDEO BRONCHOSCOPY WITH ENDOBRONCHIAL NAVIGATION (Bilateral) BRONCHOSCOPY, WITH BRUSH BIOPSY BRONCHOSCOPY, WITH NEEDLE ASPIRATION BIOPSY CRYOTHERAPY  Patient Location: PACU  Anesthesia Type:General  Level of Consciousness: drowsy and patient cooperative  Airway & Oxygen Therapy: Patient Spontanous Breathing  Post-op Assessment: Report given to RN and Post -op Vital signs reviewed and stable  Post vital signs: Reviewed and stable  Last Vitals:  Vitals Value Taken Time  BP 156/60 10/02/24 12:47  Temp    Pulse 79 10/02/24 12:49  Resp 18 10/02/24 12:49  SpO2 100 % 10/02/24 12:49  Vitals shown include unfiled device data.  Last Pain:  Vitals:   10/02/24 0855  TempSrc:   PainSc: 4       Patients Stated Pain Goal: 1 (10/02/24 0845)  Complications: There were no known notable events for this encounter.

## 2024-10-02 NOTE — Anesthesia Preprocedure Evaluation (Addendum)
 Anesthesia Evaluation  Patient identified by MRN, date of birth, ID band Patient awake    Reviewed: Allergy & Precautions, NPO status , Patient's Chart, lab work & pertinent test results  History of Anesthesia Complications Negative for: history of anesthetic complications  Airway Mallampati: II  TM Distance: >3 FB Neck ROM: Full    Dental  (+) Edentulous Upper, Edentulous Lower, Dental Advisory Given   Pulmonary neg shortness of breath, neg sleep apnea, neg COPD, neg recent URI, Current Smoker and Patient abstained from smoking.   Pulmonary exam normal breath sounds clear to auscultation       Cardiovascular hypertension (losartan ), Pt. on medications (-) angina (-) Past MI, (-) Cardiac Stents and (-) CABG (-) dysrhythmias  Rhythm:Regular Rate:Normal  HLD   Neuro/Psych neg Seizures Malignant neoplasm of breast metastatic to brain  Neuromuscular disease (cervical radiculopathy, sciatica)    GI/Hepatic negative GI ROS, Neg liver ROS,,,  Endo/Other  diabetes (Hgb A1c 7.1), Poorly Controlled    Renal/GU negative Renal ROS     Musculoskeletal  (+) Arthritis ,    Abdominal   Peds  Hematology  (+) Blood dyscrasia, anemia Lab Results      Component                Value               Date                      WBC                      6.6                 08/02/2024                HGB                      11.2 (L)            08/02/2024                HCT                      34.9 (L)            08/02/2024                MCV                      83.9                08/02/2024                PLT                      239                 08/02/2024              Anesthesia Other Findings   Reproductive/Obstetrics Ovarian cancer 1983, metastatic breast cancer                              Anesthesia Physical Anesthesia Plan  ASA: 4  Anesthesia Plan: General   Post-op Pain Management: Tylenol  PO  (pre-op)*   Induction: Intravenous  PONV Risk Score and Plan: 2 and Ondansetron , Dexamethasone , Propofol  infusion, Midazolam  and  Treatment may vary due to age or medical condition  Airway Management Planned: Oral ETT  Additional Equipment:   Intra-op Plan:   Post-operative Plan: Extubation in OR  Informed Consent: I have reviewed the patients History and Physical, chart, labs and discussed the procedure including the risks, benefits and alternatives for the proposed anesthesia with the patient or authorized representative who has indicated his/her understanding and acceptance.     Dental advisory given  Plan Discussed with: CRNA and Anesthesiologist  Anesthesia Plan Comments: (Risks of general anesthesia discussed including, but not limited to, sore throat, hoarse voice, chipped/damaged teeth, injury to vocal cords, nausea and vomiting, allergic reactions, lung infection, heart attack, stroke, and death. All questions answered.  Patient okay with chest compressions, shocks, and medication. She just would not want a machine keeping her alive.)         Anesthesia Quick Evaluation

## 2024-10-02 NOTE — Op Note (Signed)
 Procedure Note  Patient: [name]  [employer] Cios mobile C-arm was utilized to identify and biopsy right upper lobe nodule, left upper lobe nodule, right lower lobe superior segmental nodule.  Needle-in-lesion was confirmed using real-time Cios imaging, and images were uploaded to PACS.    Right upper lobe nodule    Left upper lobe nodule    Right lower lobe superior segmental nodule  Lamar Chris, MD, PhD 10/02/2024, 2:17 PM [facility] [phone] or if no answer before 7:00PM call [phone] For any issues after 7:00PM please call eLink [phone]

## 2024-10-02 NOTE — Radiation Completion Notes (Signed)
 Patient Name: Emily Hayes, MEHL MRN: [id number] Date of Birth: 14-Feb-1951 Referring Physician: CAROLYN ALLMAN, M.D. Date of Service: 2024-10-02 Radiation Oncologist: Lauraine Golden, M.D. [facility]                             RADIATION ONCOLOGY END OF TREATMENT NOTE     Diagnosis: C79.31 Secondary malignant neoplasm of brain; C72.0 Malignant neoplasm of spinal cord Staging on 2024-08-02: Malignant neoplasm of upper-outer quadrant of right breast in female, estrogen receptor positive (HCC) T=cT1c, N=cN0, M=cM0 Intent: Palliative     ==========DELIVERED PLANS==========  First Treatment Date: 2024-09-18 Last Treatment Date: 2024-09-29   Plan Name: Brain_SRT Site: Brain Technique: SBRT/SRT-IMRT Mode: Photon Dose Per Fraction: 9 Gy Prescribed Dose (Delivered / Prescribed): 27 Gy / 27 Gy Prescribed Fxs (Delivered / Prescribed): 3 / 3   Plan Name: Spine Site: Lumbar Spine Technique: 3D Mode: Photon Dose Per Fraction: 3 Gy Prescribed Dose (Delivered / Prescribed): 30 Gy / 30 Gy Prescribed Fxs (Delivered / Prescribed): 10 / 10     ==========ON TREATMENT VISIT DATES========== 2024-09-18, 2024-09-18, 2024-09-20, 2024-09-22, 2024-09-25     ==========UPCOMING VISITS========== 12/05/2024 [facility] ONCOLOGY EST PT 30 Buckley Arthea POUR, MD  12/04/2024 [facility] ONCOLOGY TUMOR BOARD [facility]TUMOR BOARD CONFERENCE  11/30/2024 [facility]315 MRI MR LUMBAR SPINE WWO CONTRAS-GI GI-315 MR 3  11/30/2024 GI-315 MRI MR BRAIN WWO CONTRAST-GI GI-315 MR 3  10/31/2024 [facility]-RADIATION ONC FOLLOW UP 30 Wyatt Leeroy HERO, [place]  10/10/2024 [facility]PULMONARY CARE OFFICE VISIT - LINZIE Helling, Dipti, MD  10/05/2024 [facility] ONCOLOGY EST PT 15 Iruku, Amber, MD  10/02/2024 [facility]STIC RAD DG PORT [facility]-DG PORT 23  10/02/2024 [facility]-DIAGNOSTIC RAD DG ENDO [facility]-DG C-ARM 25 (ENDO CIOS 3D)        ==========APPENDIX - ON TREATMENT VISIT NOTES==========   See weekly On  Treatment Notes in Epic for details in the Media tab (listed as Progress notes on the On Treatment Visit Dates listed above).

## 2024-10-02 NOTE — Anesthesia Procedure Notes (Signed)
 Procedure Name: Intubation Date/Time: 10/02/2024 11:01 AM  Performed by: Christopher Comings, CRNAPre-anesthesia Checklist: Patient identified, Emergency Drugs available, Suction available and Patient being monitored Patient Re-evaluated:Patient Re-evaluated prior to induction Oxygen Delivery Method: Circle system utilized Preoxygenation: Pre-oxygenation with 100% oxygen Induction Type: IV induction Ventilation: Mask ventilation without difficulty Laryngoscope Size: Mac and 4 Grade View: Grade I Tube type: Oral Tube size: 8.5 mm Number of attempts: 1 Airway Equipment and Method: Stylet and Oral airway Placement Confirmation: ETT inserted through vocal cords under direct vision, positive ETCO2 and breath sounds checked- equal and bilateral Secured at: 22 cm Tube secured with: Tape Dental Injury: Teeth and Oropharynx as per pre-operative assessment

## 2024-10-02 NOTE — Anesthesia Postprocedure Evaluation (Signed)
 Anesthesia Post Note  Patient: Emily Hayes  Procedure(s) Performed: VIDEO BRONCHOSCOPY WITH ENDOBRONCHIAL NAVIGATION (Bilateral) BRONCHOSCOPY, WITH BRUSH BIOPSY BRONCHOSCOPY, WITH NEEDLE ASPIRATION BIOPSY CRYOTHERAPY     Patient location during evaluation: PACU Anesthesia Type: General Level of consciousness: awake Pain management: pain level controlled Vital Signs Assessment: post-procedure vital signs reviewed and stable Respiratory status: spontaneous breathing, nonlabored ventilation and respiratory function stable Cardiovascular status: blood pressure returned to baseline and stable Postop Assessment: no apparent nausea or vomiting Anesthetic complications: no   There were no known notable events for this encounter.  Last Vitals:  Vitals:   10/02/24 1300 10/02/24 1310  BP: (!) 126/52 127/67  Pulse: 71 83  Resp: 16 (!) 26  Temp:    SpO2: 98% 98%    Last Pain:  Vitals:   10/02/24 1310  TempSrc:   PainSc: 0-No pain                 Delon Aisha Arch

## 2024-10-03 ENCOUNTER — Ambulatory Visit

## 2024-10-04 ENCOUNTER — Encounter (HOSPITAL_COMMUNITY): Payer: Self-pay | Admitting: Emergency Medicine

## 2024-10-05 ENCOUNTER — Encounter: Payer: Self-pay | Admitting: Hematology and Oncology

## 2024-10-05 ENCOUNTER — Inpatient Hospital Stay: Admitting: Hematology and Oncology

## 2024-10-05 VITALS — BP 138/72 | HR 81 | Temp 97.8°F | Resp 16 | Wt 123.0 lb

## 2024-10-05 DIAGNOSIS — C3490 Malignant neoplasm of unspecified part of unspecified bronchus or lung: Secondary | ICD-10-CM | POA: Insufficient documentation

## 2024-10-05 DIAGNOSIS — C3491 Malignant neoplasm of unspecified part of right bronchus or lung: Secondary | ICD-10-CM | POA: Diagnosis not present

## 2024-10-05 DIAGNOSIS — Z510 Encounter for antineoplastic radiation therapy: Secondary | ICD-10-CM | POA: Diagnosis not present

## 2024-10-05 LAB — CYTOLOGY - NON PAP

## 2024-10-05 MED ORDER — LETROZOLE 2.5 MG PO TABS: 2.5000 mg | ORAL_TABLET | Freq: Every day | ORAL | 3 refills | Status: DC

## 2024-10-05 NOTE — Progress Notes (Signed)
 Email sent to [facility] path to request Foundation One with PDL1 on [id number].

## 2024-10-05 NOTE — Progress Notes (Unsigned)
 [facility] CONSULT NOTE  Patient Care Team: [name] LABOR, MD as PCP - General (Family Medicine) Joshua Rush, OD (Optometry) Gerome, Devere HERO, RN as Oncology Nurse Navigator [name], RN as Oncology Nurse Navigator Aron Shoulders, MD as Consulting Physician (General Surgery) Loretha Ash, MD as Consulting Physician (Hematology and Oncology) Izell Domino, MD as Attending Physician (Radiation Oncology)  CHIEF COMPLAINTS/PURPOSE OF CONSULTATION:  Right sided stage I breast cancer, metastatic adenocarcinoma of the lung Assessment & Plan  Primary lung adenocarcinoma with brain metastasis Primary lung adenocarcinoma with brain metastasis. FoundationOne testing underway for targetable mutations. Completed radiation therapy. Pain managed with medication. - Send FoundationOne test on tumor specimen and PDL1. 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 Radiation completed. - Evaluate FoundationOne test results for targeted therapies with CNS penetration.  Synchronous primary breast cancer (Stage I) Stage I breast cancer with non-mass enhancement, likely DCIS. Immediate surgery not prioritized. Letrozole  planned for management. - Prescribe letrozole  2.5 mg daily. - Monitor breast cancer while prioritizing lung cancer treatment.  Follow-up Ongoing monitoring and coordination required for lung and breast cancer management. - Plan follow-up appointment in a couple of weeks. - Coordinate care with Dr. Emery for lung cancer management. - Continue letrozole  therapy for breast cancer.  HISTORY OF PRESENTING ILLNESS:  Emily Hayes 73 y.o. female is here because of new diagnosis of breast cancer  Oncology History  Malignant neoplasm of upper-outer quadrant of right breast in female, estrogen receptor positive (HCC)  07/07/2024 Mammogram    Highly suspicious 1.9 cm irregular mass in the right breast at 10 o'clock, 6 cm the nipple, with associated mammographic architectural distortion. 2. Suspicious calcifications in the lateral and upper outer right breast spanning at least 5 cm from anterior to posterior. 3. Likely benign 1 mm group of calcifications in the medial right breast, only visualized discretely on the cc view.   07/26/2024 Pathology Results   1. Breast, right, needle core biopsy, 10:00 6 cmfn (clip heart) :      INVASIVE MODERATELY DIFFERENTIATED DUCTAL ADENOCARCINOMA, GRADE 2 (3+2+1)      TUBULE FORMATION: SCORE 3      NUCLEAR PLEOMORPHISM: SCORE 2      MITOTIC COUNT: SCORE 1      TOTAL SCORE: 6      OVERALL GRADE: GRADE 2 (6/9)      NEGATIVE FOR ANGIOLYMPHATIC INVASION      TUMOR MEASURES 13 MM IN GREATEST LINEAR EXTENT      ADJACENT FIBROCYSTIC CHANGES INCLUDING STROMAL FIBROSIS AND ADENOSIS      MICROCALCIFICATIONS PRESENT WITHIN BENIGN GNOSIS       2. Breast, right, needle core biopsy, calcifications, central, (clip x) :      FOCAL DUCTAL CARCINOMA IN SITU, INTERMEDIATE NUCLEAR GRADE, CRIBRIFORM AND SOLID      TYPES WITHOUT NECROSIS      NEGATIVE FOR INVASIVE CARCINOMA      DCIS MEASURES 4 MM IN GREATEST LINEAR EXTENT      EXTENSIVE STROMAL FIBROSIS WITH FOCAL USUAL DUCT HYPERPLASIA      MICROCALCIFICATIONS PRESENT WITHIN DCIS AND USUAL DUCT HYPERPLASIA    07/28/2024 Initial Diagnosis   Malignant neoplasm of upper-outer quadrant of right breast in female, estrogen receptor positive (HCC)   08/02/2024 Cancer Staging   Staging form: Breast, AJCC 8th Edition - Clinical: Stage IA (cT1c, cN0, cM0, G2, ER+, PR-, HER2-) - Signed by  Delainee Tramel, MD on 08/02/2024 Histologic grading system: 3 grade system    Genetic Testing   Negative [facility] BRCAPlus + RNA (13 gene) panel. Report date 08/11/2024. 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 There is  currently no evidence to suggest a significantly increased cancer risk for carriers of MUTYH pathogenic variants. Report date 08/14/2024.   Stage IV adenocarcinoma of lung (HCC)  10/05/2024 Initial Diagnosis   Stage IV adenocarcinoma of lung (HCC)   10/05/2024 Cancer Staging   Staging form: Lung, AJCC V9 - Clinical: cTX, cNX, cM1 - Signed by Loretha Ash, MD on 10/05/2024 Stage prefix: Initial diagnosis     Discussed the use of AI scribe software for clinical note transcription with the patient, who gave verbal consent to proceed.  History of Present Illness  Emily Hayes is a 72 year old female with primary lung adenocarcinoma who presents for follow-up after completing radiation therapy.  She has completed radiation therapy as of Friday, September 29, 2024.  Most recent lung biopsy showed adenocarcinoma, lung primary  She has a synchronous primary breast cancer and imaging has shown significant non-mass enhancement in the right breast. She has been prescribed letrozole , 2.5 mg once daily, to manage the breast cancer.  Her smoking history includes smoking for over 40 years, typically less than a pack a day, and she has recently quit smoking since Monday prior to the visit.  All other systems were reviewed with the patient and are negative.  MEDICAL HISTORY:  Past Medical History:  Diagnosis Date   Cervical radiculopathy    Family history of cancer    Family history of lung cancer    Fatigue    Generalized osteoarthrosis    HTN (hypertension)    Hyperlipidemia    Lateral epicondylitis of left elbow 06/28/2017   Left knee pain    Menopause    Neck pain    Ovarian cancer (HCC) 1983   Pneumonia    Pre-diabetes    Sciatica    Swelling of left knee joint     SURGICAL HISTORY: Past Surgical History:  Procedure Laterality Date   ABCESS DRAINAGE Left 09/15/2017   behind tonsil   ABDOMINAL HYSTERECTOMY  1983   APPENDECTOMY     BREAST BIOPSY Right 07/26/2024    US  RT BREAST BX W LOC DEV 1ST LESION IMG BX SPEC US  GUIDE 07/26/2024 [facility]   BREAST BIOPSY Right 07/26/2024   MM RT BREAST BX W LOC DEV 1ST LESION IMAGE BX SPEC STEREO GUIDE 07/26/2024 [facility]   BRONCHIAL BRUSHINGS  10/02/2024   Procedure: BRONCHOSCOPY, WITH BRUSH BIOPSY;  Surgeon: Shelah Lamar RAMAN, MD;  Location: [facility];  Service: Pulmonary;;   BRONCHIAL NEEDLE ASPIRATION BIOPSY  10/02/2024   Procedure: BRONCHOSCOPY, WITH NEEDLE ASPIRATION BIOPSY;  Surgeon: Shelah Lamar RAMAN, MD;  Location[facility] ENDOSCOPY;  Service: Pulmonary;;   CRYOTHERAPY  10/02/2024   Procedure: CRYOTHERAPY;  Surgeon: Shelah Lamar RAMAN, MD;  Location: [facility];  Service: Pulmonary;;   EXCISION METACARPAL MASS Right 12/29/2023   Procedure: EXCISION METACARPAL MASS;  Surgeon: Jerri Kay HERO, MD;  Location: [facility];  Service: Orthopedics;  Laterality: Right;  right thumb and long finger cyst removal   VIDEO BRONCHOSCOPY WITH ENDOBRONCHIAL NAVIGATION Bilateral 10/02/2024   Procedure: VIDEO BRONCHOSCOPY WITH ENDOBRONCHIAL NAVIGATION;  Surgeon: Shelah Lamar RAMAN, MD;  Location: [facility] ENDOSCOPY;  Service: Pulmonary;  Laterality: Bilateral;    SOCIAL HISTORY: Social History   Socioeconomic  History   Marital status: Divorced    Spouse name: Not on file   Number of children: 2   Years of education: Not on file   Highest education level: Associate degree: academic program  Occupational History   Occupation: retired  Tobacco Use   Smoking status: Every Day    Current packs/day: 0.50    Average packs/day: 0.5 packs/day for 50.0 years (25.0 ttl pk-yrs)    Types: Cigarettes   Smokeless tobacco: Never   Tobacco comments:    Started smoking at age 36.  6 cigarettes a day. 09/11/2024.  Vaping Use   Vaping status: Never Used  Substance and Sexual Activity   Alcohol use: No   Drug use: No   Sexual activity: Not Currently    Birth control/protection: Surgical    Comment: Hyst  Other  Topics Concern   Not on file  Social History Narrative   Social History      Diet? none      Do you drink/eat things with caffeine? Chocolate- occasionally      Marital status?        divorced                            What year were you married? 1982      Do you live in a house, apartment, assisted living, condo, trailer, etc.? apartment      Is it one or more stories? 1      How many persons live in your home? 1       Do you have any pets in your home? (please list) yes, dog      Highest level of education completed? Associate degree- college      Current or past profession: retired      Do you exercise?           yes                           Type & how often? Walk- daily      Advanced Directives      Do you have a living will? yes      Do you have a DNR form?     yes                             If not, do you want to discuss one? yes      Do you have signed POA/HPOA for forms? no      Functional Status      Do you have difficulty bathing or dressing yourself?      Do you have difficulty preparing food or eating?       Do you have difficulty managing your medications?      Do you have difficulty managing your finances?      Do you have difficulty affording your medications?   Social Drivers of Health   Tobacco Use: High Risk (10/05/2024)   Patient History    Smoking Tobacco Use: Every Day    Smokeless Tobacco Use: Never    Passive Exposure: Not on file  Financial Resource Strain: Low Risk (05/29/2024)   Overall Financial Resource Strain (CARDIA)    Difficulty of Paying Living Expenses: Not hard at all  Food Insecurity: No Food Insecurity (09/05/2024)   Epic    Worried About Running Out  of Food in the Last Year: Never true    Ran Out of Food in the Last Year: Never true  Transportation Needs: No Transportation Needs (09/05/2024)   Epic    Lack of Transportation (Medical): No    Lack of Transportation (Non-Medical): No  Physical Activity: Sufficiently  Active (10/01/2023)   Exercise Vital Sign    Days of Exercise per Week: 7 days    Minutes of Exercise per Session: 40 min  Stress: No Stress Concern Present (10/01/2023)   [employer] of Occupational Health - Occupational Stress Questionnaire    Feeling of Stress : Not at all  Social Connections: Socially Isolated (05/29/2024)   Social Connection and Isolation Panel    Frequency of Communication with Friends and Family: Three times a week    Frequency of Social Gatherings with Friends and Family: Twice a week    Attends Religious Services: Patient declined    Active Member of Clubs or Organizations: No    Attends Engineer, Structural: Not on file    Marital Status: Divorced  Intimate Partner Violence: Not At Risk (09/05/2024)   Epic    Fear of Current or Ex-Partner: No    Emotionally Abused: No    Physically Abused: No    Sexually Abused: No  Depression (PHQ2-9): Low Risk (09/28/2024)   Depression (PHQ2-9)    PHQ-2 Score: 0  Alcohol Screen: Low Risk (05/29/2024)   Alcohol Screen    Last Alcohol Screening Score (AUDIT): 1  Housing: Low Risk (09/05/2024)   Epic    Unable to Pay for Housing in the Last Year: No    Number of Times Moved in the Last Year: 0    Homeless in the Last Year: No  Recent Concern: Housing - High Risk (09/01/2024)   Epic    Unable to Pay for Housing in the Last Year: Yes    Number of Times Moved in the Last Year: 0    Homeless in the Last Year: No  Utilities: Not At Risk (09/05/2024)   Epic    Threatened with loss of utilities: No  Health Literacy: Not on file    FAMILY HISTORY: Family History  Problem Relation Age of Onset   Stroke Mother    Diabetes Mother    Dementia Mother    Alzheimer's disease Mother    Cancer Sister        unknown   Cancer Sister        unknown   Cancer Sister        unknown   Cancer Sister        brain tumor   Colon polyps Brother    Hypertension Brother    Hyperlipidemia Brother    Kidney failure  Brother    Lung cancer Brother    Colon cancer Neg Hx    Esophageal cancer Neg Hx    Rectal cancer Neg Hx    Stomach cancer Neg Hx     ALLERGIES:  has no known allergies.  MEDICATIONS:  Current Outpatient Medications  Medication Sig Dispense Refill   letrozole  (FEMARA ) 2.5 MG tablet Take 1 tablet (2.5 mg total) by mouth daily. 90 tablet 3   B Complex-Folic Acid (B COMPLEX-VITAMIN B12 PO) Take 1 tablet by mouth daily.     Calcium  Citrate-Vitamin D (CALCIUM  CITRATE + D PO) Take 1 tablet by mouth 2 (two) times daily.     diphenhydrAMINE (BENADRYL) 25 MG tablet Take 25 mg by mouth as needed.  DULoxetine  (CYMBALTA ) 30 MG capsule Take 1 capsule (30 mg total) by mouth daily. 30 capsule 2   losartan  (COZAAR ) 25 MG tablet Take 1 tablet (25 mg total) by mouth daily. 90 tablet 1   Magnesium Carbonate, Antacid, (MAGNESIUM CARBONATE PO) Take 240 mg by mouth 2 (two) times daily.     ondansetron  (ZOFRAN ) 8 MG tablet Take 1 tablet (8 mg total) by mouth every 8 (eight) hours as needed for nausea or vomiting. 20 tablet 3   pregabalin  (LYRICA ) 150 MG capsule Take 1 capsule (150 mg total) by mouth 2 (two) times daily. 75 mg in am, 150 mg in pm     rosuvastatin  (CRESTOR ) 40 MG tablet Take 1 tablet (40 mg total) by mouth daily. 90 tablet 1   Simethicone (GAS RELIEF EXTRA STRENGTH PO) Take 1 capsule by mouth as needed.     vitamin E 400 UNIT capsule Take 1,200 Units by mouth daily.      No current facility-administered medications for this visit.     PHYSICAL EXAMINATION: ECOG PERFORMANCE STATUS: 0 - Asymptomatic  Vitals:   10/05/24 1500  BP: 138/72  Pulse: 81  Resp: 16  Temp: 97.8 F (36.6 C)  SpO2: 98%   Filed Weights   10/05/24 1500  Weight: 123 lb (55.8 kg)    GENERAL:alert, no distress and comfortable   LABORATORY DATA:  I have reviewed the data as listed Lab Results  Component Value Date   WBC 3.5 (L) 10/02/2024   HGB 11.9 (L) 10/02/2024   HCT 37.8 10/02/2024   MCV 86.9  10/02/2024   PLT 204 10/02/2024     Chemistry      Component Value Date/Time   NA 139 10/02/2024 0840   NA 141 01/19/2017 0000   NA 141 01/19/2017 0000   K 4.0 10/02/2024 0840   CL 103 10/02/2024 0840   CO2 28 10/02/2024 0840   BUN 12 10/02/2024 0840   BUN 17 01/19/2017 0000   BUN 17 01/19/2017 0000   CREATININE 0.72 10/02/2024 0840   CREATININE 0.73 08/02/2024 1255   CREATININE 0.81 05/30/2024 0840   GLU 102 01/19/2017 0000   GLU 102 01/19/2017 0000      Component Value Date/Time   CALCIUM  9.5 10/02/2024 0840   ALKPHOS 54 08/02/2024 1255   AST 22 08/02/2024 1255   ALT 15 08/02/2024 1255   BILITOT 0.3 08/02/2024 1255       RADIOGRAPHIC STUDIES: I have personally reviewed the radiological images as listed and agreed with the findings in the report.   All questions were answered. The patient knows to call the clinic with any problems, questions or concerns.      Amber Stalls, MD 10/05/2024 3:59 PM

## 2024-10-06 ENCOUNTER — Other Ambulatory Visit: Payer: Self-pay

## 2024-10-06 MED ORDER — LETROZOLE 2.5 MG PO TABS: 2.5000 mg | ORAL_TABLET | Freq: Every day | ORAL | 3 refills | Status: DC

## 2024-10-06 MED ORDER — LETROZOLE 2.5 MG PO TABS: 2.5000 mg | ORAL_TABLET | Freq: Every day | ORAL | 3 refills | Status: AC

## 2024-10-10 ENCOUNTER — Ambulatory Visit

## 2024-10-10 ENCOUNTER — Other Ambulatory Visit

## 2024-10-10 VITALS — BP 127/62 | HR 94 | Temp 99.0°F | Ht 66.0 in | Wt 121.2 lb

## 2024-10-10 DIAGNOSIS — C3431 Malignant neoplasm of lower lobe, right bronchus or lung: Secondary | ICD-10-CM

## 2024-10-10 DIAGNOSIS — C3412 Malignant neoplasm of upper lobe, left bronchus or lung: Secondary | ICD-10-CM | POA: Diagnosis not present

## 2024-10-10 DIAGNOSIS — Z170 Estrogen receptor positive status [ER+]: Secondary | ICD-10-CM

## 2024-10-10 DIAGNOSIS — C3411 Malignant neoplasm of upper lobe, right bronchus or lung: Secondary | ICD-10-CM | POA: Diagnosis not present

## 2024-10-10 DIAGNOSIS — C3490 Malignant neoplasm of unspecified part of unspecified bronchus or lung: Secondary | ICD-10-CM

## 2024-10-10 DIAGNOSIS — C50411 Malignant neoplasm of upper-outer quadrant of right female breast: Secondary | ICD-10-CM

## 2024-10-10 DIAGNOSIS — Z720 Tobacco use: Secondary | ICD-10-CM

## 2024-10-10 DIAGNOSIS — R918 Other nonspecific abnormal finding of lung field: Secondary | ICD-10-CM

## 2024-10-10 MED ORDER — NICOTINE 7 MG/24HR TD PT24: 21.0000 mg | MEDICATED_PATCH | Freq: Every day | TRANSDERMAL | Status: AC

## 2024-10-10 MED ORDER — NICOTINE 7 MG/24HR TD PT24: 14.0000 mg | MEDICATED_PATCH | Freq: Every day | TRANSDERMAL | Status: AC

## 2024-10-10 MED ORDER — NICOTINE 7 MG/24HR TD PT24: 7.0000 mg | MEDICATED_PATCH | Freq: Every day | TRANSDERMAL | Status: AC

## 2024-10-10 NOTE — Patient Instructions (Signed)
 It was a pleasure to see you today. Your pulmonary function test will be scheduled at check out Nicotine  patches have been sent to your pharmacy, use them as instructed. Strongly recommend quitting smoking

## 2024-10-10 NOTE — Assessment & Plan Note (Signed)
 Currently on hormonal therapy. Stage I breast cancer with non-mass enhancement, likely DCIS. Immediate surgery not prioritized.

## 2024-10-10 NOTE — Progress Notes (Signed)
 "  New Patient Pulmonology Office Visit   Subjective:  Patient ID: Emily Hayes, female    DOB: 1951/05/14  MRN: [id number]  Referred by: [name] LABOR, MD  CC:  Chief Complaint  Patient presents with   Follow-up    Pt states since LOV breathing has been good, no issues at all    09/11/2024: HPI Emily Hayes is a 73 y.o. female who is referred for possible bronchoscopic biopsy.  Discussed the use of AI scribe software for clinical note transcription with the patient, who gave verbal consent to proceed.  History of Present Illness Emily Hayes is a 73 year old female with metastatic breast cancer who presents for evaluation of a lung lesion. She is accompanied by her daughter, Emily Hayes, who is her primary caregiver. She was referred by her cancer doctor for evaluation of a lung lesion to determine the extent of her cancer.  She has a history of metastatic breast cancer with known brain involvement. A recent PET scan revealed multiple areas of concern in the lungs, including spots on both the right and left sides, as well as lymph node involvement.  No prior CT chest for comparison  PET/CT also showed hypermetabolic spinal canal lesions consistent with spinal metastasis and patient is currently undergoing radiation therapy for her spine and brain mets  She has a significant smoking history, having smoked for fifty years, but has cut down significantly. She has attempted to quit using patches, but frequent MRIs and upcoming radiation treatments have made continuous use difficult. She has also tried gums and lozenges in the past, which caused mouth soreness.  Her past medical history includes ovarian cancer 42 years ago, which was treated surgically without further treatment. She has hyperlipidemia and pre-diabetes. She reports being generally healthy and active, with no recent colds or flu. She has dense, fibrous breasts, making self-exams challenging. Genetic testing  showed she is BRCA1 and BRCA2 negative, with a family history of colorectal cancer.  She is currently on Lyrica , which provides significant pain relief, although she experiences pain when the medication wears off. No symptoms from the lung lesions, such as chest pain or hemoptysis, and no neurological symptoms from the brain lesions, such as headaches or vision problems.   INTERVAL HX 08/2024: Patient underwent bronchoscopic biopsy of 3 lung nodules on December 15/2025.  She is here to review results. In the interim patient dates oncology and was made aware of the bronchoscopic result which revealed primary lung adenocarcinoma with metastasis to the lungs.  Most likely her brain mets and spinal mets are related to lung as well. At this point she is waiting results of further genetic testing/foundation 1 test for targeted therapies with CNS penetration. Patient has completed radiation therapy for her spinal mets and reports improved peripheral neuropathy.  Remains functionally active. Smokes 4 -6 cigarettes a day   ROS Review of symptoms negative except mentioned above   Allergies: Patient has no known allergies.  Current Outpatient Medications:    Calcium  Citrate-Vitamin D (CALCIUM  CITRATE + D PO), Take 1 tablet by mouth 2 (two) times daily., Disp: , Rfl:    DULoxetine  (CYMBALTA ) 30 MG capsule, Take 1 capsule (30 mg total) by mouth daily., Disp: 30 capsule, Rfl: 2   losartan  (COZAAR ) 25 MG tablet, Take 1 tablet (25 mg total) by mouth daily., Disp: 90 tablet, Rfl: 1   Magnesium Carbonate, Antacid, (MAGNESIUM CARBONATE PO), Take 240 mg by mouth 2 (two) times daily., Disp: , Rfl:  ondansetron  (ZOFRAN ) 8 MG tablet, Take 1 tablet (8 mg total) by mouth every 8 (eight) hours as needed for nausea or vomiting., Disp: 20 tablet, Rfl: 3   pregabalin  (LYRICA ) 150 MG capsule, Take 1 capsule (150 mg total) by mouth 2 (two) times daily. 75 mg in am, 150 mg in pm, Disp: , Rfl:    rosuvastatin  (CRESTOR )  40 MG tablet, Take 1 tablet (40 mg total) by mouth daily., Disp: 90 tablet, Rfl: 1   vitamin E 400 UNIT capsule, Take 1,200 Units by mouth daily. , Disp: , Rfl:    B Complex-Folic Acid (B COMPLEX-VITAMIN B12 PO), Take 1 tablet by mouth daily. (Patient not taking: Reported on 10/10/2024), Disp: , Rfl:    diphenhydrAMINE (BENADRYL) 25 MG tablet, Take 25 mg by mouth as needed.  (Patient not taking: Reported on 10/10/2024), Disp: , Rfl:    letrozole  (FEMARA ) 2.5 MG tablet, Take 1 tablet (2.5 mg total) by mouth daily. (Patient not taking: Reported on 10/10/2024), Disp: 90 tablet, Rfl: 3   Simethicone (GAS RELIEF EXTRA STRENGTH PO), Take 1 capsule by mouth as needed. (Patient not taking: Reported on 10/10/2024), Disp: , Rfl:   Current Facility-Administered Medications:    nicotine  (NICODERM CQ  - dosed in mg/24 hr) patch 14 mg, 14 mg, Transdermal, Daily,    nicotine  (NICODERM CQ  - dosed in mg/24 hr) patch 21 mg, 21 mg, Transdermal, Daily,    nicotine  (NICODERM CQ  - dosed in mg/24 hr) patch 7 mg, 7 mg, Transdermal, Daily,  Past Medical History:  Diagnosis Date   Breast cancer (HCC)    Cervical radiculopathy    Family history of cancer    Family history of lung cancer    Fatigue    Generalized osteoarthrosis    HTN (hypertension)    Hyperlipidemia    Lateral epicondylitis of left elbow 06/28/2017   Left knee pain    Menopause    Neck pain    Ovarian cancer (HCC) 1983   Pneumonia    Pre-diabetes    Sciatica    Swelling of left knee joint    Past Surgical History:  Procedure Laterality Date   ABCESS DRAINAGE Left 09/15/2017   behind tonsil   ABDOMINAL HYSTERECTOMY  1983   APPENDECTOMY  1983   BREAST BIOPSY Right 07/26/2024   US  RT BREAST BX W LOC DEV 1ST LESION IMG BX SPEC US  GUIDE 07/26/2024 [facility]   BREAST BIOPSY Right 07/26/2024   MM RT BREAST BX W LOC DEV 1ST LESION IMAGE BX SPEC STEREO GUIDE 07/26/2024 [facility]   BRONCHIAL BRUSHINGS  10/02/2024   Procedure:  BRONCHOSCOPY, WITH BRUSH BIOPSY;  Surgeon: Shelah Lamar RAMAN, MD;  Location: [facility];  Service: Pulmonary;;   BRONCHIAL NEEDLE ASPIRATION BIOPSY  10/02/2024   Procedure: BRONCHOSCOPY, WITH NEEDLE ASPIRATION BIOPSY;  Surgeon: Shelah Lamar RAMAN, MD;  Location: [facility];  Service: Pulmonary;;   CRYOTHERAPY  10/02/2024   Procedure: CRYOTHERAPY;  Surgeon: Shelah Lamar RAMAN, MD;  Location: [facility];  Service: Pulmonary;;   EXCISION METACARPAL MASS Right 12/29/2023   Procedure: EXCISION METACARPAL MASS;  Surgeon: Jerri Kay HERO, MD;  Location:  SUR[facility]ERY [facility];  Service: Orthopedics;  Laterality: Right;  right thumb and long finger cyst removal   VIDEO BRONCHOSCOPY WITH ENDOBRONCHIAL NAVIGATION Bilateral 10/02/2024   Procedure: VIDEO BRONCHOSCOPY WITH ENDOBRONCHIAL NAVIGATION;  Surgeon: Shelah Lamar RAMAN, MD;  Location: [facility] ENDOSCOPY;  Service: Pulmonary;  Laterality: Bilateral;   Family History  Problem Relation Age of Onset  Stroke Mother    Diabetes Mother    Dementia Mother    Alzheimer's disease Mother    Cancer Sister        unknown   Cancer Sister        unknown   Cancer Sister        unknown   Cancer Sister        brain tumor   Colon polyps Brother    Hypertension Brother    Hyperlipidemia Brother    Kidney failure Brother    Lung cancer Brother    Diabetes Brother    Colon cancer Neg Hx    Esophageal cancer Neg Hx    Rectal cancer Neg Hx    Stomach cancer Neg Hx    Social History   Socioeconomic History   Marital status: Divorced    Spouse name: Not on file   Number of children: 2   Years of education: Not on file   Highest education level: Associate degree: academic program  Occupational History   Occupation: retired  Tobacco Use   Smoking status: Every Day    Current packs/day: 0.50    Average packs/day: 0.5 packs/day for 50.0 years (25.0 ttl pk-yrs)    Types: Cigarettes   Smokeless tobacco: Never   Tobacco comments:    Started smoking at age 43.   6 cigarettes a day. 09/11/2024.    Currently smoking 4 cigarettes a day 10/10/2024  Vaping Use   Vaping status: Never Used  Substance and Sexual Activity   Alcohol use: No   Drug use: No   Sexual activity: Not Currently    Birth control/protection: Surgical    Comment: Hyst  Other Topics Concern   Not on file  Social History Narrative   Social History      Diet? none      Do you drink/eat things with caffeine? Chocolate- occasionally      Marital status?        divorced                            What year were you married? 1982      Do you live in a house, apartment, assisted living, condo, trailer, etc.? apartment      Is it one or more stories? 1      How many persons live in your home? 1       Do you have any pets in your home? (please list) yes, dog      Highest level of education completed? Associate degree- college      Current or past profession: retired      Do you exercise?           yes                           Type & how often? Walk- daily      Advanced Directives      Do you have a living will? yes      Do you have a DNR form?     yes                             If not, do you want to discuss one? yes      Do you have signed POA/HPOA for forms? no      Functional Status  Do you have difficulty bathing or dressing yourself?      Do you have difficulty preparing food or eating?       Do you have difficulty managing your medications?      Do you have difficulty managing your finances?      Do you have difficulty affording your medications?   Social Drivers of Health   Tobacco Use: High Risk (10/10/2024)   Patient History    Smoking Tobacco Use: Every Day    Smokeless Tobacco Use: Never    Passive Exposure: Not on file  Financial Resource Strain: Low Risk (05/29/2024)   Overall Financial Resource Strain (CARDIA)    Difficulty of Paying Living Expenses: Not hard at all  Food Insecurity: No Food Insecurity (09/05/2024)   Epic    Worried  About Programme Researcher, Broadcasting/film/video in the Last Year: Never true    Ran Out of Food in the Last Year: Never true  Transportation Needs: No Transportation Needs (09/05/2024)   Epic    Lack of Transportation (Medical): No    Lack of Transportation (Non-Medical): No  Physical Activity: Sufficiently Active (10/01/2023)   Exercise Vital Sign    Days of Exercise per Week: 7 days    Minutes of Exercise per Session: 40 min  Stress: No Stress Concern Present (10/01/2023)   [employer] of Occupational Health - Occupational Stress Questionnaire    Feeling of Stress : Not at all  Social Connections: Socially Isolated (05/29/2024)   Social Connection and Isolation Panel    Frequency of Communication with Friends and Family: Three times a week    Frequency of Social Gatherings with Friends and Family: Twice a week    Attends Religious Services: Patient declined    Active Member of Clubs or Organizations: No    Attends Engineer, Structural: Not on file    Marital Status: Divorced  Intimate Partner Violence: Not At Risk (09/05/2024)   Epic    Fear of Current or Ex-Partner: No    Emotionally Abused: No    Physically Abused: No    Sexually Abused: No  Depression (PHQ2-9): Low Risk (09/28/2024)   Depression (PHQ2-9)    PHQ-2 Score: 0  Alcohol Screen: Low Risk (05/29/2024)   Alcohol Screen    Last Alcohol Screening Score (AUDIT): 1  Housing: Low Risk (09/05/2024)   Epic    Unable to Pay for Housing in the Last Year: No    Number of Times Moved in the Last Year: 0    Homeless in the Last Year: No  Recent Concern: Housing - High Risk (09/01/2024)   Epic    Unable to Pay for Housing in the Last Year: Yes    Number of Times Moved in the Last Year: 0    Homeless in the Last Year: No  Utilities: Not At Risk (09/05/2024)   Epic    Threatened with loss of utilities: No  Health Literacy: Not on file         Objective:  BP 127/62   Pulse 94   Temp 99 F (37.2 C) (Oral)   Ht 5' 6 (1.676  m) Comment: per patient  Wt 121 lb 3.2 oz (55 kg)   SpO2 97% Comment: on RA  BMI 19.56 kg/m    Physical Exam Constitutional:      General: She is not in acute distress.    Appearance: Normal appearance.  HENT:     Mouth/Throat:     Mouth: Mucous membranes  are moist.  Cardiovascular:     Rate and Rhythm: Normal rate.  Pulmonary:     Effort: No respiratory distress.     Breath sounds: No wheezing or rales.  Musculoskeletal:     Right lower leg: No edema.     Left lower leg: No edema.  Skin:    General: Skin is warm.  Neurological:     Mental Status: She is alert and oriented to person, place, and time.  Psychiatric:        Mood and Affect: Mood normal.     Diagnostic Review:    Pft     No data to display               Results RADIOLOGY personally reviewed  PET CT: 1. Hypermetabolic right breast mass with biopsy clip, consistent with primary breast carcinoma. 2. Hypermetabolic left upper lobe nodule and hypermetabolic left hilar node, consistent with pulmonary metastases. 3. Mildly hypermetabolic right upper lobe nodule, consistent with pulmonary metastasis. 4. Hypermetabolic spinal canal lesions at L2 and L5, consistent with spinal metastases.       Assessment & Plan:   Assessment & Plan Tobacco abuse Smoking/Tobacco Cessation Counseling Emily Hayes is a current user of tobacco or nicotine  products. She is ready to quit at this time. Counseling provided today addressed the risks of continued use and the benefits of cessation. Discussed tobacco/nicotine  use history, readiness to quit, and evidence-based treatment options including behavioral strategies, support resources, and pharmacologic therapies. Provided encouragement and educational materials on steps and resources to quit smoking. Patient questions were addressed, and follow-up recommended for continued support. Total time spent on counseling: 4 minutes. Prescribed nicotine  patches with  tapering dose. PFT will be scheduled.  Does not have COPD like symptoms current    Orders:   nicotine  (NICODERM CQ  - dosed in mg/24 hr) patch 21 mg   nicotine  (NICODERM CQ  - dosed in mg/24 hr) patch 14 mg   nicotine  (NICODERM CQ  - dosed in mg/24 hr) patch 7 mg   Pulmonary function test; Future  Primary malignant neoplasm of lung metastatic to other site, unspecified laterality (HCC) Based on recent bronchoscopic biopsy. All 3 samples from left upper lobe, right upper lobe and right lower lobe were consistent with adenocarcinoma of lung primary Is established with oncology.  Will be following with Dr. Dyana as per oncology notes. Orders:   nicotine  (NICODERM CQ  - dosed in mg/24 hr) patch 21 mg   nicotine  (NICODERM CQ  - dosed in mg/24 hr) patch 14 mg   nicotine  (NICODERM CQ  - dosed in mg/24 hr) patch 7 mg   Pulmonary function test; Future  Malignant neoplasm of upper-outer quadrant of right breast in female, estrogen receptor positive (HCC) Currently on hormonal therapy. Stage I breast cancer with non-mass enhancement, likely DCIS. Immediate surgery not prioritized.       Thank you for the opportunity to take part in the care of Emily Hayes   Return in about 6 months (around 04/10/2025).   Aidyn Kellis Pleas, MD [facility] Office: [phone]   60-minute time devoted to history taking, review of images, review of prior radiation oncology and oncology notes and communication notes, review of PET/CT scan, discussion of risks and benefits of bronchoscopic biopsy with the patient and her daughter, scheduling bronchoscopy and care coordination "

## 2024-10-10 NOTE — Assessment & Plan Note (Deleted)
 SABRA

## 2024-10-17 MED ORDER — NICOTINE 7 MG/24HR TD PT24: 7.0000 mg | MEDICATED_PATCH | Freq: Every day | TRANSDERMAL | 0 refills | Status: AC

## 2024-10-17 MED ORDER — NICOTINE 14 MG/24HR TD PT24: 14.0000 mg | MEDICATED_PATCH | Freq: Every day | TRANSDERMAL | 0 refills | Status: AC

## 2024-10-17 MED ORDER — NICOTINE 21 MG/24HR TD PT24: 21.0000 mg | MEDICATED_PATCH | Freq: Every day | TRANSDERMAL | 0 refills | Status: AC

## 2024-10-17 NOTE — Progress Notes (Incomplete)
 [name] is here for a one month follow up. She completed treatment for secondary malignant neoplasm of brain and spinal cord. Patient identity verified x2.  Patient follow-up for a diagnosis of: ***   Location-  ***  They completed their radiation on: ***  Does the patient complain of any of the following: Chest pains/ SOB: *** Headaches/ Dizziness: *** Post radiation skin issues *** Joint pain/ Swelling: *** Motor function issues: *** Range of motion limitations: *** Fatigue post radiation: *** Appetite good/fair/poor: ***  Additional comments if applicable: ***

## 2024-10-23 ENCOUNTER — Encounter: Payer: Self-pay | Admitting: *Deleted

## 2024-10-24 ENCOUNTER — Encounter: Payer: Self-pay | Admitting: Hematology and Oncology

## 2024-10-24 ENCOUNTER — Encounter: Payer: Self-pay | Admitting: Internal Medicine

## 2024-10-25 ENCOUNTER — Telehealth: Payer: Self-pay | Admitting: Radiation Oncology

## 2024-10-25 ENCOUNTER — Other Ambulatory Visit: Payer: Self-pay

## 2024-10-25 MED ORDER — DULOXETINE HCL 30 MG PO CPEP: 30.0000 mg | ORAL_CAPSULE | Freq: Every day | ORAL | 2 refills | Status: AC

## 2024-10-25 NOTE — Telephone Encounter (Signed)
 LVM for pt moving appt due to insurance as requested by pt.   Pt returned call advising she spoke with NN Megan and had another appt scheduled for 1/13 and that they wanted appt with Izell same day after MedOnc appts. Pt was advised that RadOnc appt was moved at her request. She initially wanted to speak with NN, then decided she wanted appt with Squrie moved to 1/13 instead. Appt moved as requested back to 1/13.

## 2024-10-26 ENCOUNTER — Other Ambulatory Visit: Payer: Self-pay

## 2024-10-26 ENCOUNTER — Other Ambulatory Visit (HOSPITAL_BASED_OUTPATIENT_CLINIC_OR_DEPARTMENT_OTHER)

## 2024-10-27 NOTE — Progress Notes (Signed)
 The proposed treatment discussed in conference is for discussion purpose only and is not a binding recommendation.  The patients have not been physically examined, or presented with their treatment options.  Therefore, final treatment plans cannot be decided.

## 2024-10-27 NOTE — Progress Notes (Incomplete)
 [name] is here for a one month follow up. She completed treatment for secondary malignant neoplasm of brain and spinal cord. Patient identity verified x2.  Patient follow-up for a diagnosis of: ***   Location-  ***  They completed their radiation on: ***  Does the patient complain of any of the following: Chest pains/ SOB: *** Headaches/ Dizziness: *** Post radiation skin issues *** Joint pain/ Swelling: *** Motor function issues: *** Range of motion limitations: *** Fatigue post radiation: *** Appetite good/fair/poor: ***  Additional comments if applicable: ***

## 2024-10-30 ENCOUNTER — Other Ambulatory Visit: Payer: Self-pay | Admitting: Medical Oncology

## 2024-10-30 DIAGNOSIS — C3491 Malignant neoplasm of unspecified part of right bronchus or lung: Secondary | ICD-10-CM

## 2024-10-31 ENCOUNTER — Ambulatory Visit: Admitting: Radiology

## 2024-10-31 ENCOUNTER — Inpatient Hospital Stay: Payer: Self-pay | Attending: Hematology and Oncology

## 2024-10-31 ENCOUNTER — Ambulatory Visit: Admitting: Radiation Oncology

## 2024-10-31 ENCOUNTER — Inpatient Hospital Stay: Payer: Self-pay | Attending: Hematology and Oncology | Admitting: Internal Medicine

## 2024-10-31 ENCOUNTER — Ambulatory Visit
Admission: RE | Admit: 2024-10-31 | Discharge: 2024-10-31 | Disposition: A | Discharge status: Home / Self Care | Source: Ambulatory Visit | Attending: Radiation Oncology | Admitting: Radiation Oncology

## 2024-10-31 VITALS — BP 116/78 | HR 84 | Temp 97.8°F | Resp 17 | Ht 66.0 in | Wt 120.8 lb

## 2024-10-31 VITALS — BP 116/78 | HR 84 | Temp 97.8°F | Resp 17 | Ht 66.0 in | Wt 121.0 lb

## 2024-10-31 DIAGNOSIS — Z923 Personal history of irradiation: Secondary | ICD-10-CM | POA: Insufficient documentation

## 2024-10-31 DIAGNOSIS — C50411 Malignant neoplasm of upper-outer quadrant of right female breast: Secondary | ICD-10-CM | POA: Insufficient documentation

## 2024-10-31 DIAGNOSIS — Z79811 Long term (current) use of aromatase inhibitors: Secondary | ICD-10-CM | POA: Insufficient documentation

## 2024-10-31 DIAGNOSIS — C7949 Secondary malignant neoplasm of other parts of nervous system: Secondary | ICD-10-CM | POA: Insufficient documentation

## 2024-10-31 DIAGNOSIS — C7931 Secondary malignant neoplasm of brain: Secondary | ICD-10-CM | POA: Insufficient documentation

## 2024-10-31 DIAGNOSIS — M5430 Sciatica, unspecified side: Secondary | ICD-10-CM | POA: Insufficient documentation

## 2024-10-31 DIAGNOSIS — Z79899 Other long term (current) drug therapy: Secondary | ICD-10-CM | POA: Insufficient documentation

## 2024-10-31 DIAGNOSIS — C3411 Malignant neoplasm of upper lobe, right bronchus or lung: Secondary | ICD-10-CM

## 2024-10-31 DIAGNOSIS — C3491 Malignant neoplasm of unspecified part of right bronchus or lung: Secondary | ICD-10-CM

## 2024-10-31 DIAGNOSIS — R4701 Aphasia: Secondary | ICD-10-CM | POA: Insufficient documentation

## 2024-10-31 LAB — CBC WITH DIFFERENTIAL (CANCER CENTER ONLY)
Abs Immature Granulocytes: 0.01 K/uL (ref 0.00–0.07)
Basophils Absolute: 0 K/uL (ref 0.0–0.1)
Basophils Relative: 1 %
Eosinophils Absolute: 0.1 K/uL (ref 0.0–0.5)
Eosinophils Relative: 2 %
HCT: 34.1 % — ABNORMAL LOW (ref 36.0–46.0)
Hemoglobin: 11 g/dL — ABNORMAL LOW (ref 12.0–15.0)
Immature Granulocytes: 0 %
Lymphocytes Relative: 22 %
Lymphs Abs: 0.9 K/uL (ref 0.7–4.0)
MCH: 27.2 pg (ref 26.0–34.0)
MCHC: 32.3 g/dL (ref 30.0–36.0)
MCV: 84.2 fL (ref 80.0–100.0)
Monocytes Absolute: 0.5 K/uL (ref 0.1–1.0)
Monocytes Relative: 13 %
Neutro Abs: 2.4 K/uL (ref 1.7–7.7)
Neutrophils Relative %: 62 %
Platelet Count: 213 K/uL (ref 150–400)
RBC: 4.05 MIL/uL (ref 3.87–5.11)
RDW: 16 % — ABNORMAL HIGH (ref 11.5–15.5)
WBC Count: 3.9 K/uL — ABNORMAL LOW (ref 4.0–10.5)
nRBC: 0 % (ref 0.0–0.2)

## 2024-10-31 LAB — CMP (CANCER CENTER ONLY)
ALT: 12 U/L (ref 0–44)
AST: 18 U/L (ref 15–41)
Albumin: 4.3 g/dL (ref 3.5–5.0)
Alkaline Phosphatase: 48 U/L (ref 38–126)
Anion gap: 10 (ref 5–15)
BUN: 17 mg/dL (ref 8–23)
CO2: 28 mmol/L (ref 22–32)
Calcium: 9.8 mg/dL (ref 8.9–10.3)
Chloride: 103 mmol/L (ref 98–111)
Creatinine: 0.78 mg/dL (ref 0.44–1.00)
GFR, Estimated: >60 mL/min
Glucose, Bld: 123 mg/dL — ABNORMAL HIGH (ref 70–99)
Potassium: 4.4 mmol/L (ref 3.5–5.1)
Sodium: 141 mmol/L (ref 135–145)
Total Bilirubin: <0.2 mg/dL (ref 0.0–1.2)
Total Protein: 6.8 g/dL (ref 6.5–8.1)

## 2024-10-31 NOTE — Progress Notes (Signed)
 This NN met with the pt today at her consult appt with Dr. Sherrod. Pt was accompanied by her dtr, Cat. The plan for the pt is to wait for the results of her molecular studies and return in approx 2 weeks to discuss results and develop treatment plan. No questions voiced by the pt or her dtr regarding this current plan.  NN provided pt with direct contact information and encouraged pt call with any questions or concerns.  NN escorted pt and her dtr to the pt's Rad Onc appt. NN let the pt know that scheduling will reach out to pt to arrange for her follow up appt.

## 2024-10-31 NOTE — Progress Notes (Signed)
 " Radiation Oncology         [phone] ________________________________  Name: Emily Hayes MRN: [id number]  Date: 10/31/2024  DOB: 1950/12/14  Follow-Up Visit Note  Outpatient  CC: [name] LABOR, MD  [name] LABOR, MD  Diagnosis and Prior Radiotherapy:    ICD-10-CM   1. Secondary malignant neoplasm of brain and spinal cord [facility])  C79.31    C79.49      First Treatment Date: 2024-09-18 Last Treatment Date: 2024-09-29   Plan Name: Brain_SRT Site: Brain Technique: SBRT/SRT-IMRT Mode: Photon Dose Per Fraction: 9 Gy Prescribed Dose (Delivered / Prescribed): 27 Gy / 27 Gy Prescribed Fxs (Delivered / Prescribed): 3 / 3   Plan Name: Spine Site: Lumbar Spine Technique: 3D Mode: Photon Dose Per Fraction: 3 Gy Prescribed Dose (Delivered / Prescribed): 30 Gy / 30 Gy Prescribed Fxs (Delivered / Prescribed): 10 / 10  CHIEF COMPLAINT: Here for follow-up and surveillance of CNS cancer  Narrative:  The patient returns today for routine follow-up.  Emily Hayes is here today for follow up post radiation to the brain/spine   completed their radiation on: 09/29/2024  Does the patient complain of any of the following: Headache: None Visual Changes: None Hearing Changes: None Nausea: None Vomiting: None Balance or coordination issues:  Memory issues: Mild  Motor function issues: Yes Is the patient currently on a Decadron  regimen? : None  Based on recent bronchoscopic biopsy all 3 samples from left upper lobe, right upper lobe and right lower lobe were consistent with adenocarcinoma of lung primary (per pulmonary specialist) -saw Dr Sherrod today.  Her sciatic pain has improved significantly.  She is walking well.  She has had 3 episodes of expressive aphasia where she speaks in gibberish but is thinking clearly.  No headaches nor nausea.  She has a little bit of constipation that responds to half of a colon cleanser pill   BP 116/78 (BP Location: Right Arm,  Patient Position: Sitting, Cuff Size: Large)   Pulse 84   Temp 97.8 F (36.6 C)   Resp 17   Ht 5' 6 (1.676 m)   Wt 120 lb 12.8 oz (54.8 kg)   SpO2 100%   BMI 19.50 kg/m   Wt Readings from Last 3 Encounters:  10/31/24 120 lb 12.8 oz (54.8 kg)  10/31/24 121 lb (54.9 kg)  10/10/24 121 lb 3.2 oz (55 kg)                                  ALLERGIES:  has no known allergies.  Meds: Current Outpatient Medications  Medication Sig Dispense Refill   B Complex-Folic Acid (B COMPLEX-VITAMIN B12 PO) Take 1 tablet by mouth daily. (Patient not taking: Reported on 10/10/2024)     Calcium  Citrate-Vitamin D (CALCIUM  CITRATE + D PO) Take 1 tablet by mouth 2 (two) times daily.     diphenhydrAMINE (BENADRYL) 25 MG tablet Take 25 mg by mouth as needed.  (Patient not taking: Reported on 10/10/2024)     DULoxetine  (CYMBALTA ) 30 MG capsule Take 1 capsule (30 mg total) by mouth daily. 30 capsule 2   letrozole  (FEMARA ) 2.5 MG tablet Take 1 tablet (2.5 mg total) by mouth daily. (Patient not taking: Reported on 10/10/2024) 90 tablet 3   losartan  (COZAAR ) 25 MG tablet Take 1 tablet (25 mg total) by mouth daily. 90 tablet 1   Magnesium Carbonate, Antacid, (MAGNESIUM CARBONATE PO) Take  240 mg by mouth 2 (two) times daily.     nicotine  (NICODERM CQ ) 14 mg/24hr patch Place 1 patch (14 mg total) onto the skin daily. 28 patch 0   nicotine  (NICODERM CQ ) 21 mg/24hr patch Place 1 patch (21 mg total) onto the skin daily. 28 patch 0   nicotine  (NICODERM CQ ) 7 mg/24hr patch Place 1 patch (7 mg total) onto the skin daily. 28 patch 0   ondansetron  (ZOFRAN ) 8 MG tablet Take 1 tablet (8 mg total) by mouth every 8 (eight) hours as needed for nausea or vomiting. 20 tablet 3   pregabalin  (LYRICA ) 150 MG capsule Take 1 capsule (150 mg total) by mouth 2 (two) times daily. 75 mg in am, 150 mg in pm     rosuvastatin  (CRESTOR ) 40 MG tablet Take 1 tablet (40 mg total) by mouth daily. 90 tablet 1   Simethicone (GAS RELIEF EXTRA STRENGTH  PO) Take 1 capsule by mouth as needed. (Patient not taking: Reported on 10/10/2024)     vitamin E 400 UNIT capsule Take 1,200 Units by mouth daily.      Current Facility-Administered Medications  Medication Dose Route Frequency Provider Last Rate Last Admin   nicotine  (NICODERM CQ  - dosed in mg/24 hr) patch 7 mg  7 mg Transdermal Daily         Physical Findings: The patient is in no acute distress. Patient is alert and oriented.  height is 5' 6 (1.676 m) and weight is 120 lb 12.8 oz (54.8 kg). Her temperature is 97.8 F (36.6 C). Her blood pressure is 116/78 and her pulse is 84. Her respiration is 17 and oxygen saturation is 100%. .    General: Alert and oriented, in no acute distress HEENT: Head is normocephalic. Extraocular movements are intact. Oropharynx is clear. Extremities: No cyanosis or edema. Skin: No concerning lesions.  Mild hyperpigmentation over lumbar spine and radiation field Musculoskeletal: Grossly symmetric strength and muscle tone throughout.  Ambulatory.  Able to walk steadily Neurologic: Cranial nerves II through XII are grossly intact. No obvious focalities. Speech is fluent.  Psychiatric: Judgment and insight are intact. Affect is appropriate.  KPS = 90  100 - Normal; no complaints; no evidence of disease. 90   - Able to carry on normal activity; minor signs or symptoms of disease. 80   - Normal activity with effort; some signs or symptoms of disease. 33   - Cares for self; unable to carry on normal activity or to do active work. 60   - Requires occasional assistance, but is able to care for most of his personal needs. 50   - Requires considerable assistance and frequent medical care. 40   - Disabled; requires special care and assistance. 30   - Severely disabled; hospital admission is indicated although death not imminent. 20   - Very sick; hospital admission necessary; active supportive treatment necessary. 10   - Moribund; fatal processes progressing  rapidly. 0     - Dead  Karnofsky DA, Abelmann WH, Craver LS and Burchenal [facility] [phone]) The use of the nitrogen mustards in the palliative treatment of carcinoma: with particular reference to bronchogenic carcinoma Cancer 1 [id number]    Lab Findings: Lab Results  Component Value Date   WBC 3.9 (L) 10/31/2024   HGB 11.0 (L) 10/31/2024   HCT 34.1 (L) 10/31/2024   MCV 84.2 10/31/2024   PLT 213 10/31/2024    Radiographic Findings: DG Chest Port 1 View Result Date: 10/02/2024 CLINICAL DATA:  Status  post bronchoscopy and biopsy. History of breast cancer. EXAM: PORTABLE CHEST 1 VIEW COMPARISON:  Chest CT dated 09/12/2024. FINDINGS: Right upper lobe mass better seen on the CT. No new consolidation. No pleural effusion or pneumothorax. Stable cardiac silhouette. No acute osseous pathology. IMPRESSION: 1. No pneumothorax. 2. Right upper lobe mass. Electronically Signed   By: Vanetta Chou M.D.   On: 10/02/2024 13:36   DG C-ARM BRONCHOSCOPY Result Date: 10/02/2024 C-ARM BRONCHOSCOPY: Fluoroscopy was utilized by the requesting physician.  No radiographic interpretation.   DG C-Arm 1-60 Min-No Report Result Date: 10/02/2024 Fluoroscopy was utilized by the requesting physician.  No radiographic interpretation.    Impression/Plan:   Leptomeningeal disease status post stereotactic radiosurgery to multiple brain metastases and palliative lumbar spine radiation  She is doing well overall - sciatic pain much better.  Has had 3 episodes of expressive aphasia but otherwise stable/improved.    She will undergo MRI of brain and MRI total spine in mid February and follow-up with neuro-oncology thereafter.  I am happy to see her alternating with neuro-oncology following her scans.  I sent a message to our CNS navigator and Dr Buckley to coordinate care.    On date of service, in total, I spent 30 minutes on this encounter. Patient was seen in person.  _____________________________________   Lauraine Golden,  MD  "

## 2024-10-31 NOTE — Progress Notes (Signed)
 "   [facility] Telephone:([phone]) [phone]   Fax:([phone]) [phone]  CONSULT NOTE  REFERRING PHYSICIAN: Dr. Amber Iruku  REASON FOR CONSULTATION:  74 years old African-American female recently diagnosed with lung cancer  HPI Emily Hayes is a 74 y.o. female.   HPI  Discussed the use of AI scribe software for clinical note transcription with the patient, who gave verbal consent to proceed.  History of Present Illness Emily Hayes is a 74 year old female with newly diagnosed stage IV lung adenocarcinoma with brain and lumbar spine metastases who presents for initial oncology consultation to discuss management and systemic therapy options.  She was diagnosed with lung adenocarcinoma following evaluation for lower back pain and imaging that revealed multiple metastatic lesions. PET and MRI in November 2025 identified multiple brain metastases, including a right temporal lobe mass (3.1 x 1.8 x 2.2 cm), and intradural lumbar spine lesions at L2 and L5. Bronchoscopy with biopsy of right and left upper lobe lesions confirmed primary lung adenocarcinoma. She has not undergone surgical resection of lung lesions. She received stereotactic radiosurgery to three brain lesions and radiation to the lumbar spine. She has not received chemotherapy or immunotherapy to date.  She currently reports good functional status and is independent in activities of daily living. She describes occasional episodes of expressive aphasia, characterized by transient difficulty expressing thoughts without altered mentation, occurring three times since starting radiation. She denies headaches, visual disturbances, nausea, vomiting, or seizures. She is not taking corticosteroids and last received a brief course of dexamethasone  in August 2025.  She has chronic left hamstring pain and persistent numbness in the left foot, which worsens at night and is associated with cramping and toe discomfort. These  symptoms have been more severe over the past three to four days but are currently improved and tolerable. She denies chest pain, dyspnea, or changes in bowel habits.  She was diagnosed with right breast ductal carcinoma in situ in September 2025 and started letrozole  one week ago without adverse effects. She has a prior history of ovarian cancer. She is currently attempting smoking cessation and has reduced her daily cigarette use, though she has encountered barriers to obtaining nicotine  replacement therapy. Family history significant for mother with diabetes mellitus and stroke.  Father died from old age at age 53.  She has 2 brother and 1 sister with cancer. She is divorced and has 2 daughters.  She used to work as a chiropodist.  She has a history of smoking for around 54 years and she continues to smoke.  She has no history of alcohol or drug abuse.    Past Medical History:  Diagnosis Date   Breast cancer (HCC)    Cervical radiculopathy    Family history of cancer    Family history of lung cancer    Fatigue    Generalized osteoarthrosis    HTN (hypertension)    Hyperlipidemia    Lateral epicondylitis of left elbow 06/28/2017   Left knee pain    Menopause    Neck pain    Ovarian cancer (HCC) 1983   Pneumonia    Pre-diabetes    Sciatica    Swelling of left knee joint       Past Surgical History:  Procedure Laterality Date   ABCESS DRAINAGE Left 09/15/2017   behind tonsil   ABDOMINAL HYSTERECTOMY  1983   APPENDECTOMY  1983   BREAST BIOPSY Right 07/26/2024   US  RT BREAST BX W LOC DEV  1ST LESION IMG BX SPEC US  GUIDE 07/26/2024 [facility]   BREAST BIOPSY Right 07/26/2024   MM RT BREAST BX W LOC DEV 1ST LESION IMAGE BX SPEC STEREO GUIDE 07/26/2024 [facility]   BRONCHIAL BRUSHINGS  10/02/2024   Procedure: BRONCHOSCOPY, WITH BRUSH BIOPSY;  Surgeon: Shelah Lamar RAMAN, MD;  Location: [facility];  Service: Pulmonary;;   BRONCHIAL NEEDLE ASPIRATION BIOPSY   10/02/2024   Procedure: BRONCHOSCOPY, WITH NEEDLE ASPIRATION BIOPSY;  Surgeon: Shelah Lamar RAMAN, MD;  Location: [facility] ENDOSCOPY;  Service: Pulmonary;;   CRYOTHERAPY  10/02/2024   Procedure: CRYOTHERAPY;  Surgeon: Shelah Lamar RAMAN, MD;  Location: [facility]OS[facility];  Service: Pulmonary;;   EXCISION METACARPAL MASS Right 12/29/2023   Procedure: EXCISION METACARPAL MASS;  Surgeon: Jerri Kay HERO, MD;  Location: [facility];  Service: Orthopedics;  Laterality: Right;  right thumb and long finger cyst removal   VIDEO BRONCHOSCOPY WITH ENDOBRONCHIAL NAVIGATION Bilateral 10/02/2024   Procedure: VIDEO BRONCHOSCOPY WITH ENDOBRONCHIAL NAVIGATION;  Surgeon: Shelah Lamar RAMAN, MD;  Location: [facility] ENDOSCOPY;  Service: Pulmonary;  Laterality: Bilateral;    Family History  Problem Relation Age of Onset   Stroke Mother    Diabetes Mother    Dementia Mother    Alzheimer's disease Mother    Cancer Sister        unknown   Cancer Sister        unknown   Cancer Sister        unknown   Cancer Sister        brain tumor   Colon polyps Brother    Hypertension Brother    Hyperlipidemia Brother    Kidney failure Brother    Lung cancer Brother    Diabetes Brother    Colon cancer Neg Hx    Esophageal cancer Neg Hx    Rectal cancer Neg Hx    Stomach cancer Neg Hx     Social History Social History[1]  Allergies[2]  Current Outpatient Medications  Medication Sig Dispense Refill   B Complex-Folic Acid (B COMPLEX-VITAMIN B12 PO) Take 1 tablet by mouth daily. (Patient not taking: Reported on 10/10/2024)     Calcium  Citrate-Vitamin D (CALCIUM  CITRATE + D PO) Take 1 tablet by mouth 2 (two) times daily.     diphenhydrAMINE (BENADRYL) 25 MG tablet Take 25 mg by mouth as needed.  (Patient not taking: Reported on 10/10/2024)     DULoxetine  (CYMBALTA ) 30 MG capsule Take 1 capsule (30 mg total) by mouth daily. 30 capsule 2   letrozole  (FEMARA ) 2.5 MG tablet Take 1 tablet (2.5 mg total) by mouth daily. (Patient not  taking: Reported on 10/10/2024) 90 tablet 3   losartan  (COZAAR ) 25 MG tablet Take 1 tablet (25 mg total) by mouth daily. 90 tablet 1   Magnesium Carbonate, Antacid, (MAGNESIUM CARBONATE PO) Take 240 mg by mouth 2 (two) times daily.     nicotine  (NICODERM CQ ) 14 mg/24hr patch Place 1 patch (14 mg total) onto the skin daily. 28 patch 0   nicotine  (NICODERM CQ ) 21 mg/24hr patch Place 1 patch (21 mg total) onto the skin daily. 28 patch 0   nicotine  (NICODERM CQ ) 7 mg/24hr patch Place 1 patch (7 mg total) onto the skin daily. 28 patch 0   ondansetron  (ZOFRAN ) 8 MG tablet Take 1 tablet (8 mg total) by mouth every 8 (eight) hours as needed for nausea or vomiting. 20 tablet 3   pregabalin  (LYRICA ) 150 MG capsule Take 1 capsule (150 mg total) by  mouth 2 (two) times daily. 75 mg in am, 150 mg in pm     rosuvastatin  (CRESTOR ) 40 MG tablet Take 1 tablet (40 mg total) by mouth daily. 90 tablet 1   Simethicone (GAS RELIEF EXTRA STRENGTH PO) Take 1 capsule by mouth as needed. (Patient not taking: Reported on 10/10/2024)     vitamin E 400 UNIT capsule Take 1,200 Units by mouth daily.      Current Facility-Administered Medications  Medication Dose Route Frequency Provider Last Rate Last Admin   nicotine  (NICODERM CQ  - dosed in mg/24 hr) patch 7 mg  7 mg Transdermal Daily         Review of Systems  Constitutional: positive for fatigue Eyes: negative Ears, nose, mouth, throat, and face: negative Respiratory: negative Cardiovascular: negative Gastrointestinal: negative Genitourinary:negative Integument/breast: negative Hematologic/lymphatic: negative Musculoskeletal:positive for back pain Neurological: negative Behavioral/Psych: negative Endocrine: negative Allergic/Immunologic: negative  Physical Exam  MJO:jozmu, healthy, no distress, well nourished, and well developed SKIN: skin color, texture, turgor are normal, no rashes or significant lesions HEAD: Normocephalic, No masses, lesions, tenderness  or abnormalities EYES: normal, PERRLA, Conjunctiva are pink and non-injected EARS: External ears normal, Canals clear OROPHARYNX:no exudate, no erythema, and lips, buccal mucosa, and tongue normal  NECK: supple, no adenopathy, no JVD LYMPH:  no palpable lymphadenopathy, no hepatosplenomegaly BREAST:not examined LUNGS: clear to auscultation , and palpation HEART: regular rate & rhythm, no murmurs, and no gallops ABDOMEN:abdomen soft, non-tender, normal bowel sounds, and no masses or organomegaly BACK: Back symmetric, no curvature., No CVA tenderness EXTREMITIES:no joint deformities, effusion, or inflammation, no edema  NEURO: alert & oriented x 3 with fluent speech, no focal motor/sensory deficits  PERFORMANCE STATUS: ECOG 1  LABORATORY DATA: Lab Results  Component Value Date   WBC 3.9 (L) 10/31/2024   HGB 11.0 (L) 10/31/2024   HCT 34.1 (L) 10/31/2024   MCV 84.2 10/31/2024   PLT 213 10/31/2024      Chemistry      Component Value Date/Time   NA 141 10/31/2024 1349   NA 141 01/19/2017 0000   NA 141 01/19/2017 0000   K 4.4 10/31/2024 1349   CL 103 10/31/2024 1349   CO2 28 10/31/2024 1349   BUN 17 10/31/2024 1349   BUN 17 01/19/2017 0000   BUN 17 01/19/2017 0000   CREATININE 0.78 10/31/2024 1349   CREATININE 0.81 05/30/2024 0840   GLU 102 01/19/2017 0000   GLU 102 01/19/2017 0000      Component Value Date/Time   CALCIUM  9.8 10/31/2024 1349   ALKPHOS 48 10/31/2024 1349   AST 18 10/31/2024 1349   ALT 12 10/31/2024 1349   BILITOT <0.2 10/31/2024 1349       RADIOGRAPHIC STUDIES: DG Chest Port 1 View Result Date: 10/02/2024 CLINICAL DATA:  Status post bronchoscopy and biopsy. History of breast cancer. EXAM: PORTABLE CHEST 1 VIEW COMPARISON:  Chest CT dated 09/12/2024. FINDINGS: Right upper lobe mass better seen on the CT. No new consolidation. No pleural effusion or pneumothorax. Stable cardiac silhouette. No acute osseous pathology. IMPRESSION: 1. No pneumothorax. 2.  Right upper lobe mass. Electronically Signed   By: Vanetta Chou M.D.   On: 10/02/2024 13:36   DG C-ARM BRONCHOSCOPY Result Date: 10/02/2024 C-ARM BRONCHOSCOPY: Fluoroscopy was utilized by the requesting physician.  No radiographic interpretation.   DG C-Arm 1-60 Min-No Report Result Date: 10/02/2024 Fluoroscopy was utilized by the requesting physician.  No radiographic interpretation.    ASSESSMENT: This is a very pleasant 73 years  old African-American female with stage IV (T1b, N0, M1 C) non-small cell lung cancer, adenocarcinoma presented with right upper lobe lung nodule in addition to left upper lobe nodule as well as metastatic disease to the brain and lumbar spine diagnosed in December 2025. Molecular studies are still pending. She also has history of right breast DCIS and currently on treatment with letrozole  by Dr. Loretha.   PLAN: I had a lengthy discussion with the patient and her daughter today about her current disease stage, prognosis and treatment options.  I personally and independently reviewed her imaging studies, pathology report and discussed the results with the patient interpreter.  Assessment and Plan Assessment & Plan Stage IV lung adenocarcinoma with brain and bone metastases She has newly diagnosed stage IV non-small cell lung adenocarcinoma with metastases to the brain (multiple lesions, including a right temporal lobe mass) and lumbar spine (intradural lesions). She completed stereotactic radiosurgery [facility]) to three brain lesions and lumbar spine. She remains asymptomatic from pulmonary disease, with no chest pain or dyspnea, and has minor neurological symptoms (intermittent gibberish speech, left foot numbness, hamstring pain) likely attributable to lumbar involvement. The treatment approach is palliative, aiming to prolong survival and maintain quality of life. Prognosis without systemic therapy is approximately six months; with systemic therapy, median survival is  around two years, potentially longer if a targetable mutation is identified. Risks and benefits of chemotherapy, immunotherapy, and targeted therapy were discussed, including fatigue, cytopenias, nausea, vomiting, and immune-related adverse events. Molecular testing is pending to guide first-line therapy, prioritizing targeted therapy if eligible due to superior outcomes and CNS penetration. - Await molecular marker results (expected in ~10 days) to determine eligibility for targeted therapy. - If a targetable mutation is identified, initiate targeted therapy as first-line treatment. - If no targetable mutation is found, offer: (1) combination chemotherapy and immunotherapy (preferred), or (2) immunotherapy (pembrolizumab) alone, given PD-L1 expression of 50%. - Chemotherapy regimen: two agents for initial four cycles, then single agent with pembrolizumab maintenance; pembrolizumab to start on day one. - Immunotherapy alone is an option if she declines chemotherapy. - Reviewed risks and anticipated outcomes: chemotherapy (fatigue, cytopenias, nausea, vomiting, uncommon alopecia); immunotherapy (immune-related adverse events--rash, diarrhea, organ inflammation; lower risk without autoimmune disease). - Systemic therapy may impact CNS lesions if blood-brain barrier is compromised; targeted therapy may be more effective for CNS disease if mutation present. - Follow up in two weeks to review molecular results and finalize treatment plan. - Refer to radiation oncology for new or progressive CNS lesions as indicated.  Right breast ductal carcinoma in situ (DCIS) on hormonal therapy She has right breast DCIS diagnosed September 2025, recently initiated letrozole  for anti-estrogen therapy. She has not experienced side effects.  - Continue letrozole  for DCIS. - Refill letrozole  as needed.  Nicotine  dependence, current smoker She has a greater than 50-year smoking history and continues to smoke, though she is  motivated to quit and has reduced daily cigarette consumption. She has encountered pharmacy barriers to obtaining prescribed nicotine  replacement therapy. Smoking cessation is critical for optimizing cancer treatment outcomes. - Encouraged smoking cessation and provided ongoing support. - Addressed barriers to nicotine  replacement therapy; advised over-the-counter options are equivalent to prescription and may be used if prescription unavailable. - Continued counseling on importance of smoking cessation for cancer treatment efficacy. The patient was advised to call immediately if she has any other concerning symptoms in the interval.  The patient voices understanding of current disease status and treatment options and is in agreement  with the current care plan.  All questions were answered. The patient knows to call the clinic with any problems, questions or concerns. We can certainly see the patient much sooner if necessary.  Thank you so much for allowing me to participate in the care of Emily Hayes. I will continue to follow up the patient with you and assist in her care. The total time spent in the appointment was 90 minutes including review of chart and various tests results, discussions about plan of care and coordination of care plan .   Disclaimer: This note was dictated with voice recognition software. Similar sounding words can inadvertently be transcribed and may not be corrected upon review.   Emily Hayes October 31, 2024, 2:28 PM        [1]  Social History Tobacco Use   Smoking status: Every Day    Current packs/day: 0.50    Average packs/day: 0.5 packs/day for 50.0 years (25.0 ttl pk-yrs)    Types: Cigarettes   Smokeless tobacco: Never   Tobacco comments:    Started smoking at age 63.  6 cigarettes a day. 09/11/2024.    Currently smoking 4 cigarettes a day 10/10/2024  Vaping Use   Vaping status: Never Used  Substance Use Topics   Alcohol use: No    Drug use: No  [2] No Known Allergies  "

## 2024-11-01 ENCOUNTER — Other Ambulatory Visit: Payer: Self-pay | Admitting: Radiation Therapy

## 2024-11-01 ENCOUNTER — Encounter (HOSPITAL_COMMUNITY): Payer: Self-pay

## 2024-11-01 DIAGNOSIS — C7931 Secondary malignant neoplasm of brain: Secondary | ICD-10-CM

## 2024-11-11 NOTE — Progress Notes (Unsigned)
 [facility] OFFICE PROGRESS NOTE  Emily Hayes LABOR, MD [address] [place] [address]  DIAGNOSIS:  1) Stage IV (T1b, N0, M1 C) non-small cell lung cancer, adenocarcinoma presented with right upper lobe lung nodule in addition to left upper lobe nodule as well as metastatic disease to the brain and lumbar spine diagnosed in December 2025.  2) She also has history of right breast DCIS and currently on treatment with letrozole  by Dr. Loretha.   PDL1: 50%  Moleculars: ***  PRIOR THERAPY: 1) SBRT to the brain completed on 09/29/2024.  In addition to radiation to the spine under the care of Dr. Izell  CURRENT THERAPY: Palliative chemotherapy and immunotherapy with carboplatin for an AUC of 5, Alimta 500 mg/m, Keytruda 200 mg IV every 3 weeks.  First dose expected on 11/22/24.  INTERVAL HISTORY: Emily Hayes 74 y.o. female returns to clinic today for follow-up visit.  The patient establish care with Dr. Sherrod on 10/31/2024.  She was recently found to have stage IV lung cancer with metastatic disease to the brain for which she underwent radiation.  At her last appointment she needed the results of her molecular studies before determining treatment options.  She denies any major changes in her health since she was seen.  She denies any fever, chills, or night sweats.  Appetite and weight loss?  She does have some neurological changes related to her brain radiation with speech?  She denies any headache or visual changes.  Balance?  Chest pain and shortness of breath?  Cough?  Denies any hemoptysis.  Denies any nausea, vomiting, diarrhea, or constipation.  Denies any headache or visual changes.  Denies any rashes or skin changes.  She is here today for evaluation and for more detailed discussion about her current condition and treatment options    MEDICAL HISTORY: Past Medical History:  Diagnosis Date   Breast cancer (HCC)    Cervical radiculopathy     Family history of cancer    Family history of lung cancer    Fatigue    Generalized osteoarthrosis    HTN (hypertension)    Hyperlipidemia    Lateral epicondylitis of left elbow 06/28/2017   Left knee pain    Menopause    Neck pain    Ovarian cancer (HCC) 1983   Pneumonia    Pre-diabetes    Sciatica    Swelling of left knee joint     ALLERGIES:  has no known allergies.  MEDICATIONS:  Current Outpatient Medications  Medication Sig Dispense Refill   B Complex-Folic Acid  (B COMPLEX-VITAMIN B12 PO) Take 1 tablet by mouth daily. (Patient not taking: Reported on 10/10/2024)     Calcium  Citrate-Vitamin D (CALCIUM  CITRATE + D PO) Take 1 tablet by mouth 2 (two) times daily.     diphenhydrAMINE (BENADRYL) 25 MG tablet Take 25 mg by mouth as needed.  (Patient not taking: Reported on 10/10/2024)     DULoxetine  (CYMBALTA ) 30 MG capsule Take 1 capsule (30 mg total) by mouth daily. 30 capsule 2   letrozole  (FEMARA ) 2.5 MG tablet Take 1 tablet (2.5 mg total) by mouth daily. (Patient not taking: Reported on 10/10/2024) 90 tablet 3   losartan  (COZAAR ) 25 MG tablet Take 1 tablet (25 mg total) by mouth daily. 90 tablet 1   Magnesium Carbonate, Antacid, (MAGNESIUM CARBONATE PO) Take 240 mg by mouth 2 (two) times daily.     nicotine  (NICODERM CQ ) 14 mg/24hr patch Place 1  patch (14 mg total) onto the skin daily. 28 patch 0   nicotine  (NICODERM CQ ) 21 mg/24hr patch Place 1 patch (21 mg total) onto the skin daily. 28 patch 0   nicotine  (NICODERM CQ ) 7 mg/24hr patch Place 1 patch (7 mg total) onto the skin daily. 28 patch 0   ondansetron  (ZOFRAN ) 8 MG tablet Take 1 tablet (8 mg total) by mouth every 8 (eight) hours as needed for nausea or vomiting. 20 tablet 3   pregabalin  (LYRICA ) 150 MG capsule Take 1 capsule (150 mg total) by mouth 2 (two) times daily. 75 mg in am, 150 mg in pm     rosuvastatin  (CRESTOR ) 40 MG tablet Take 1 tablet (40 mg total) by mouth daily. 90 tablet 1   Simethicone (GAS RELIEF EXTRA  STRENGTH PO) Take 1 capsule by mouth as needed. (Patient not taking: Reported on 10/10/2024)     vitamin E 400 UNIT capsule Take 1,200 Units by mouth daily.      No current facility-administered medications for this visit.    SURGICAL HISTORY:  Past Surgical History:  Procedure Laterality Date   ABCESS DRAINAGE Left 09/15/2017   behind tonsil   ABDOMINAL HYSTERECTOMY  1983   APPENDECTOMY  1983   BREAST BIOPSY Right 07/26/2024   US  RT BREAST BX W LOC DEV 1ST LESION IMG BX SPEC US  GUIDE 07/26/2024 [facility]   BREAST BIOPSY Right 07/26/2024   MM RT BREAST BX W LOC DEV 1ST LESION IMAGE BX SPEC STEREO GUIDE 07/26/2024 [facility]   BRONCHIAL BRUSHINGS  10/02/2024   Procedure: BRONCHOSCOPY, WITH BRUSH BIOPSY;  Surgeon: Shelah Lamar RAMAN, MD;  Location: [facility];  Service: Pulmonary;;   BRONCHIAL NEEDLE ASPIRATION BIOPSY  10/02/2024   Procedure: BRONCHOSCOPY, WITH NEEDLE ASPIRATION BIOPSY;  Surgeon: Shelah Lamar RAMAN, MD;  Location: [facility] ENDOSCOPY;  Service: Pulmonary;;   CRYOTHERAPY  10/02/2024   Procedure: CRYOTHERAPY;  Surgeon: Shelah Lamar RAMAN, MD;  Location: [facility] ENDOSCOPY;  Service: Pulmonary;;   EXCISION METACARPAL MASS Right 12/29/2023   Procedure: EXCISION METACARPAL MASS;  Surgeon: Jerri Kay HERO, MD;  Location: [facility];  Service: Orthopedics;  Laterality: Right;  right thumb and long finger cyst removal   VIDEO BRONCHOSCOPY WITH ENDOBRONCHIAL NAVIGATION Bilateral 10/02/2024   Procedure: VIDEO BRONCHOSCOPY WITH ENDOBRONCHIAL NAVIGATION;  Surgeon: Shelah Lamar RAMAN, MD;  Location: [facility] END[facility];  Service: Pulmonary;  Laterality: Bilateral;    REVIEW OF SYSTEMS:   Review of Systems  Constitutional: Negative for appetite change, chills, fatigue, fever and unexpected weight change.  HENT:   Negative for mouth sores, nosebleeds, sore throat and trouble swallowing.   Eyes: Negative for eye problems and icterus.  Respiratory: Negative for cough, hemoptysis,  shortness of breath and wheezing.   Cardiovascular: Negative for chest pain and leg swelling.  Gastrointestinal: Negative for abdominal pain, constipation, diarrhea, nausea and vomiting.  Genitourinary: Negative for bladder incontinence, difficulty urinating, dysuria, frequency and hematuria.   Musculoskeletal: Negative for back pain, gait problem, neck pain and neck stiffness.  Skin: Negative for itching and rash.  Neurological: Negative for dizziness, extremity weakness, gait problem, headaches, light-headedness and seizures.  Hematological: Negative for adenopathy. Does not bruise/bleed easily.  Psychiatric/Behavioral: Negative for confusion, depression and sleep disturbance. The patient is not nervous/anxious.     PHYSICAL EXAMINATION:  There were no vitals taken for this visit.  ECOG PERFORMANCE STATUS: {CHL ONC ECOG [id number]  Physical Exam  Constitutional: Oriented to person, place, and time and well-developed, well-nourished, and in no  distress. No distress.  HENT:  Head: Normocephalic and atraumatic.  Mouth/Throat: Oropharynx is clear and moist. No oropharyngeal exudate.  Eyes: Conjunctivae are normal. Right eye exhibits no discharge. Left eye exhibits no discharge. No scleral icterus.  Neck: Normal range of motion. Neck supple.  Cardiovascular: Normal rate, regular rhythm, normal heart sounds and intact distal pulses.   Pulmonary/Chest: Effort normal and breath sounds normal. No respiratory distress. No wheezes. No rales.  Abdominal: Soft. Bowel sounds are normal. Exhibits no distension and no mass. There is no tenderness.  Musculoskeletal: Normal range of motion. Exhibits no edema.  Lymphadenopathy:    No cervical adenopathy.  Neurological: Alert and oriented to person, place, and time. Exhibits normal muscle tone. Gait normal. Coordination normal.  Skin: Skin is warm and dry. No rash noted. Not diaphoretic. No erythema. No pallor.  Psychiatric: Mood, memory and  judgment normal.  Vitals reviewed.  LABORATORY DATA: Lab Results  Component Value Date   WBC 3.9 (L) 10/31/2024   HGB 11.0 (L) 10/31/2024   HCT 34.1 (L) 10/31/2024   MCV 84.2 10/31/2024   PLT 213 10/31/2024      Chemistry      Component Value Date/Time   NA 141 10/31/2024 1349   NA 141 01/19/2017 0000   NA 141 01/19/2017 0000   K 4.4 10/31/2024 1349   CL 103 10/31/2024 1349   CO2 28 10/31/2024 1349   BUN 17 10/31/2024 1349   BUN 17 01/19/2017 0000   BUN 17 01/19/2017 0000   CREATININE 0.78 10/31/2024 1349   CREATININE 0.81 05/30/2024 0840   GLU 102 01/19/2017 0000   GLU 102 01/19/2017 0000      Component Value Date/Time   CALCIUM  9.8 10/31/2024 1349   ALKPHOS 48 10/31/2024 1349   AST 18 10/31/2024 1349   ALT 12 10/31/2024 1349   BILITOT <0.2 10/31/2024 1349       RADIOGRAPHIC STUDIES:  No results found.   ASSESSMENT/PLAN:  This is a very pleasant 74 year old African-American female with stage IV (T1b, N0, M1 C) non-small cell lung cancer, adenocarcinoma presented with right upper lobe lung nodule in addition to left upper lobe nodule as well as metastatic disease to the brain and lumbar spine diagnosed in December 2025.   PDL1: 50% Her molecular studies show ***  Dr. Sherrod had a lengthly discussion with the patient today about her current condition and treatment options. The patient was given the option of a referral to hospice/palliative vs. Treatment with systemic chemotherapy with carboplatin for an AUC of 5, Alimta 500 mg/m, and Keytruda 200 mg IV every 3 weeks.  The patient is interested in proceeding with systemic chemotherapy.  She is expected to start her first dose of this treatment on __.  We discussed the adverse side effects of treatment including but not limited to alopecia, myelosuppression, nausea and vomiting, peripheral neuropathy, liver or renal dysfunction as well as immunotherapy mediated adverse effects.   I will arrange for the patient  to have a chemoeducation class prior to receiving her first cycle of chemotherapy.   We will arrange for the patient to have a B12 injection while in the clinic today.     I sent prescriptions for 1 mg folic acid  p.o. daily as well as Compazine  10 mg every 6 hours as needed for nausea.   The patient will follow-up in 2 weeks for a one-week follow-up visit after completing her first cycle of chemotherapy.  ***Decadron  ***  She will continue to follow  with radiation oncology and Dr. Buckley for history of metastatic disease to the brain  The patient was advised to call immediately if she has any concerning symptoms in the interval. The patient voices understanding of current disease status and treatment options and is in agreement with the current care plan. All questions were answered. The patient knows to call the clinic with any problems, questions or concerns. We can certainly see the patient much sooner if necessary   No orders of the defined types were placed in this encounter.    I spent {CHL ONC TIME VISIT - DTPQU:[id number]} counseling the patient face to face. The total time spent in the appointment was {CHL ONC TIME VISIT - DTPQU:[id number]}.  London Tarnowski L Tayron Hunnell, PA-C 11/11/24

## 2024-11-13 ENCOUNTER — Other Ambulatory Visit: Payer: Self-pay

## 2024-11-13 DIAGNOSIS — C3411 Malignant neoplasm of upper lobe, right bronchus or lung: Secondary | ICD-10-CM

## 2024-11-14 ENCOUNTER — Inpatient Hospital Stay

## 2024-11-14 ENCOUNTER — Inpatient Hospital Stay: Admitting: Physician Assistant

## 2024-11-14 VITALS — BP 139/75 | HR 78 | Temp 98.1°F | Resp 16 | Ht 66.0 in | Wt 119.0 lb

## 2024-11-14 DIAGNOSIS — C3491 Malignant neoplasm of unspecified part of right bronchus or lung: Secondary | ICD-10-CM

## 2024-11-14 DIAGNOSIS — Z7189 Other specified counseling: Secondary | ICD-10-CM | POA: Diagnosis not present

## 2024-11-14 DIAGNOSIS — C3490 Malignant neoplasm of unspecified part of unspecified bronchus or lung: Secondary | ICD-10-CM | POA: Diagnosis not present

## 2024-11-14 DIAGNOSIS — C3411 Malignant neoplasm of upper lobe, right bronchus or lung: Secondary | ICD-10-CM

## 2024-11-14 LAB — CBC WITH DIFFERENTIAL (CANCER CENTER ONLY)
Abs Immature Granulocytes: 0.01 10*3/uL (ref 0.00–0.07)
Basophils Absolute: 0 10*3/uL (ref 0.0–0.1)
Basophils Relative: 1 %
Eosinophils Absolute: 0 10*3/uL (ref 0.0–0.5)
Eosinophils Relative: 1 %
HCT: 36.4 % (ref 36.0–46.0)
Hemoglobin: 11.6 g/dL — ABNORMAL LOW (ref 12.0–15.0)
Immature Granulocytes: 0 %
Lymphocytes Relative: 31 %
Lymphs Abs: 1 10*3/uL (ref 0.7–4.0)
MCH: 26.7 pg (ref 26.0–34.0)
MCHC: 31.9 g/dL (ref 30.0–36.0)
MCV: 83.7 fL (ref 80.0–100.0)
Monocytes Absolute: 0.5 10*3/uL (ref 0.1–1.0)
Monocytes Relative: 14 %
Neutro Abs: 1.8 10*3/uL (ref 1.7–7.7)
Neutrophils Relative %: 53 %
Platelet Count: 199 10*3/uL (ref 150–400)
RBC: 4.35 MIL/uL (ref 3.87–5.11)
RDW: 15.6 % — ABNORMAL HIGH (ref 11.5–15.5)
WBC Count: 3.4 10*3/uL — ABNORMAL LOW (ref 4.0–10.5)
nRBC: 0 % (ref 0.0–0.2)

## 2024-11-14 LAB — CMP (CANCER CENTER ONLY)
ALT: 14 U/L (ref 0–44)
AST: 21 U/L (ref 15–41)
Albumin: 4.6 g/dL (ref 3.5–5.0)
Alkaline Phosphatase: 50 U/L (ref 38–126)
Anion gap: 10 (ref 5–15)
BUN: 15 mg/dL (ref 8–23)
CO2: 28 mmol/L (ref 22–32)
Calcium: 10 mg/dL (ref 8.9–10.3)
Chloride: 100 mmol/L (ref 98–111)
Creatinine: 0.79 mg/dL (ref 0.44–1.00)
GFR, Estimated: >60 mL/min
Glucose, Bld: 113 mg/dL — ABNORMAL HIGH (ref 70–99)
Potassium: 4.2 mmol/L (ref 3.5–5.1)
Sodium: 138 mmol/L (ref 135–145)
Total Bilirubin: 0.4 mg/dL (ref 0.0–1.2)
Total Protein: 7 g/dL (ref 6.5–8.1)

## 2024-11-14 MED ORDER — PROCHLORPERAZINE MALEATE 10 MG PO TABS: 10.0000 mg | ORAL_TABLET | Freq: Four times a day (QID) | ORAL | 2 refills | Status: DC | PRN

## 2024-11-14 MED ORDER — LIDOCAINE-PRILOCAINE 2.5-2.5 % EX CREA: 1.0000 | TOPICAL_CREAM | CUTANEOUS | 2 refills | Status: DC | PRN

## 2024-11-14 MED ORDER — ONDANSETRON HCL 8 MG PO TABS: 8.0000 mg | ORAL_TABLET | Freq: Three times a day (TID) | ORAL | 1 refills | Status: AC | PRN

## 2024-11-14 MED ORDER — FOLIC ACID 1 MG PO TABS: 1.0000 mg | ORAL_TABLET | Freq: Every day | ORAL | 3 refills | Status: AC

## 2024-11-14 MED ORDER — LIDOCAINE-PRILOCAINE 2.5-2.5 % EX CREA: TOPICAL_CREAM | CUTANEOUS | 3 refills | Status: AC

## 2024-11-14 MED ORDER — CYANOCOBALAMIN 1000 MCG/ML IJ SOLN
1000.0000 ug | Freq: Once | INTRAMUSCULAR | Status: AC
  Administered 2024-11-14: 1000 ug via INTRAMUSCULAR
  Filled 2024-11-14: qty 1

## 2024-11-14 MED ORDER — PROCHLORPERAZINE MALEATE 10 MG PO TABS: 10.0000 mg | ORAL_TABLET | Freq: Four times a day (QID) | ORAL | 1 refills | Status: AC | PRN

## 2024-11-14 MED ORDER — FOLIC ACID 1 MG PO TABS: 1.0000 mg | ORAL_TABLET | Freq: Every day | ORAL | 2 refills | Status: DC

## 2024-11-14 NOTE — Progress Notes (Signed)
 START ON PATHWAY REGIMEN - Non-Small Cell Lung     A cycle is every 21 days:     Pembrolizumab      Pemetrexed      Carboplatin   **Always confirm dose/schedule in your pharmacy ordering system**  Patient Characteristics: Stage IV Metastatic, Nonsquamous, Molecular Analysis Completed, Molecular Alteration Present and Targeted Therapy Exhausted, OR EGFR/KRAS/HER2/NRG1/c-Met Present and Standard Chemotherapy/Immunotherapy Planned, OR No Alteration Present, Initial  Chemotherapy/Immunotherapy, PS = 0, 1, No Alteration Present, No Alteration Present, Candidate for Immunotherapy, PD-L1 Expression Positive  ? 50% (TPS) and Immunotherapy Candidate Therapeutic Status: Stage IV Metastatic Histology: Nonsquamous Cell Broad Molecular Profiling Status: Animal Nutritionist Analysis Results: No Alteration Present Chemotherapy/Immunotherapy Line of Therapy: Initial Chemotherapy/Immunotherapy ECOG Performance Status: 1 EGFR Exons 18-21 Mutation Testing Status: Completed and Negative BRAF V600 Mutation Testing Status: Completed and Negative KRAS G12C Mutation Testing Status: Completed and Negative MET Exon 14 Mutation Testing Status: Completed and Negative HER2 Mutation Testing Status: Completed and Negative c-Met Overexpression (EGFR Wildtype) Testing Status: Completed and Negative ALK Fusion/Rearrangement Testing Status: Completed and Negative RET Fusion/Rearrangement Testing Status: Completed and Negative NTRK Fusion/Rearrangement Testing Status: Completed and Negative ROS1 Fusion/Rearrangement Testing Status: Completed and Negative NRG1 Fusion/Rearrangement Testing Status: Completed and Negative Immunotherapy Candidate Status: Candidate for Immunotherapy PD-L1 Expression Status: PD-L1 Positive ? 50% (TPS) Intent of Therapy: Non-Curative / Palliative Intent, Discussed with Patient

## 2024-11-14 NOTE — Patient Instructions (Addendum)
 Summary:  -There are two main categories of lung cancer, they are named based on the size of the cancer cell. One is called Non-Small cell lung cancer. The other type is Small Cell Lung Cancer -The sample (biopsy) that they took of your tumor was consistent with a subtype of Non-small cell lung cancer called Adenocarcinoma. This is the most common type of lung cancer.  -We covered a lot of important information at your appointment today regarding what the treatment plan is moving forward. Here are the the main points that were discussed at your office visit with us  today:  -The treatment that you will receive consists of two chemotherapy drugs, called Carboplatin and Alimta (also called Pemetrexed) and one immunotherapy drug called Keytruda (pembrolizumab).  -We are planning on starting your treatment next week on 11/21/24 but before your start your treatment, I would like you to attend a Chemotherapy Education Class. This involves having you sit down with one of our nurse educators. She will discuss with your one-on-one more details about your treatment as well as general information about resources here at the cancer center.  -Your treatment will be given once every 3 weeks. We will check your labs once a week for the first ~5 treatments just to make sure that important components of your blood are in an acceptable range -We will get a CT scan after 3 treatments to check on the progress of treatment.   Medications:  -I have sent a few important medication prescriptions to your pharmacy.  -Compazine  was sent to your pharmacy. This medication is for nausea. You may take this every 6 hours as needed if you feel nauseous.  -I have also sent a prescription for 1 mg of folic acid  to your pharmacy. We need you to take 1 tablet every day.  -We will administer vitamin B12 every 9 weeks while you are here in the clinic. You have received your first dose today.   Referrals or Imaging:    Follow up:  -We  will see you back for a follow up visit 1 week after your first treatment to see how it went and help manage any side effects of treatment that you may have   -If you need to reach us  at any time, the main office number to the cancer center is [phone], when you call, ask to speak to either Cassie's or Dr. Jeannett nurse.

## 2024-11-15 ENCOUNTER — Other Ambulatory Visit: Payer: Self-pay

## 2024-11-15 ENCOUNTER — Other Ambulatory Visit: Payer: Self-pay | Admitting: Medical Oncology

## 2024-11-15 ENCOUNTER — Telehealth: Payer: Self-pay | Admitting: Medical Oncology

## 2024-11-15 NOTE — Telephone Encounter (Signed)
 I answered pts questions re prescriptions , length of tx, education class, port procedure , care and maintenance. She understands not to apply the EMLA  cream to the port a cath  area for 2 weeks after port placement

## 2024-11-16 ENCOUNTER — Encounter

## 2024-11-17 ENCOUNTER — Inpatient Hospital Stay

## 2024-11-17 NOTE — Progress Notes (Signed)
 Nutrition Assessment   Reason for Assessment:  Stage IV lung cancer, starting chemo. What can patient do and eat to help herself during chemo    ASSESSMENT:  74 year old female with stage IV non-small cell lung cancer with metastatic disease to brain and lumbar spine.  History of DCIS, HTN, HLD, ovarian cancer, pre-DM.  Completed SBRT to brain on 09/29/24, in addition to radiation to spine.  Planning chemotherapy/immunotherapy (carboplatin , pemetrexed  and keytruda).    Spoke with patient via phone for nutrition assessment.  Reports that her appetite is good.  Usually eats eggs with grits in the am.  Lunch is sandwich. Dinner is chicken or fish with vegetables/sides.  Likes pasta, rice, vegetables, fruits, whole grains.  Does not eat much beef or pork.  Is a engineer, agricultural and likes to bake (cookies, cakes). Recently purchased ensure max protein shakes to try.       Medications: folic acid , compazine , calcium  and vit D, letrozole .    Labs: glucose 113   Anthropometrics:   Height: 66 inches Weight: 119 lb 125 lb 9/25 BMI: 19  5% weight loss in the last 4 months, concerning   Estimated Energy Needs  Kcals: 1350-1620 Protein: 68-81 g Fluid: 1350-1620 ml   NUTRITION DIAGNOSIS: Food and nutrition related knowledge deficit related to cancer and cancer treatment as evidenced by meeting with RD    INTERVENTION:  Concerned about weight loss and low BMI. Recommend increasing calories and protein to help maintain weight.  Discussed ways to add calories to diet Continue foods rich in protein. Encouraged 350 calorie ONS for added nutrition Patient tearful during visit. Expressed challenges with transportation.  Currently does not have a working car.  Willing to speak with LCSW team after her first treatment on 2/6. Message sent to LCSW   MONITORING, EVALUATION, GOAL: weight trends, intake   Next Visit: Friday, 2/27 during infusion  Cloyce Blankenhorn B. Dasie SOLON, CSO, LDN Registered  Dietitian [phone]

## 2024-11-17 NOTE — Progress Notes (Signed)
 Pharmacist Chemotherapy Monitoring - Initial Assessment    Anticipated start date: 11/24/24   The following has been reviewed per standard work regarding the patient's treatment regimen: The patient's diagnosis, treatment plan and drug doses, and organ/hematologic function Lab orders and baseline tests specific to treatment regimen  The treatment plan start date, drug sequencing, and pre-medications Prior authorization status  Patient's documented medication list, including drug-drug interaction screen and prescriptions for anti-emetics and supportive care specific to the treatment regimen The drug concentrations, fluid compatibility, administration routes, and timing of the medications to be used The patient's access for treatment and lifetime cumulative dose history, if applicable  The patient's medication allergies and previous infusion related reactions, if applicable   Changes made to treatment plan:    Follow up needed:  Port placement   Candy Glatter, PharmD, [facility]

## 2024-11-20 ENCOUNTER — Inpatient Hospital Stay

## 2024-11-20 ENCOUNTER — Other Ambulatory Visit: Payer: Self-pay | Admitting: Radiology

## 2024-11-21 ENCOUNTER — Ambulatory Visit (HOSPITAL_COMMUNITY)
Admission: RE | Admit: 2024-11-21 | Discharge: 2024-11-21 | Disposition: A | Source: Ambulatory Visit | Attending: Physician Assistant

## 2024-11-21 ENCOUNTER — Ambulatory Visit: Admitting: Radiation Oncology

## 2024-11-21 ENCOUNTER — Encounter: Payer: Self-pay | Admitting: Internal Medicine

## 2024-11-21 ENCOUNTER — Encounter

## 2024-11-21 ENCOUNTER — Encounter (HOSPITAL_COMMUNITY): Payer: Self-pay

## 2024-11-21 ENCOUNTER — Other Ambulatory Visit: Payer: Self-pay | Admitting: *Deleted

## 2024-11-21 ENCOUNTER — Other Ambulatory Visit: Payer: Self-pay

## 2024-11-21 ENCOUNTER — Telehealth: Payer: Self-pay

## 2024-11-21 DIAGNOSIS — F17210 Nicotine dependence, cigarettes, uncomplicated: Secondary | ICD-10-CM | POA: Insufficient documentation

## 2024-11-21 DIAGNOSIS — Z86000 Personal history of in-situ neoplasm of breast: Secondary | ICD-10-CM | POA: Insufficient documentation

## 2024-11-21 DIAGNOSIS — C7931 Secondary malignant neoplasm of brain: Secondary | ICD-10-CM | POA: Insufficient documentation

## 2024-11-21 DIAGNOSIS — R918 Other nonspecific abnormal finding of lung field: Secondary | ICD-10-CM

## 2024-11-21 DIAGNOSIS — Z79899 Other long term (current) drug therapy: Secondary | ICD-10-CM | POA: Insufficient documentation

## 2024-11-21 DIAGNOSIS — C3491 Malignant neoplasm of unspecified part of right bronchus or lung: Secondary | ICD-10-CM | POA: Insufficient documentation

## 2024-11-21 DIAGNOSIS — C3490 Malignant neoplasm of unspecified part of unspecified bronchus or lung: Secondary | ICD-10-CM

## 2024-11-21 DIAGNOSIS — Z8543 Personal history of malignant neoplasm of ovary: Secondary | ICD-10-CM | POA: Insufficient documentation

## 2024-11-21 DIAGNOSIS — C7951 Secondary malignant neoplasm of bone: Secondary | ICD-10-CM | POA: Insufficient documentation

## 2024-11-21 MED ORDER — HEPARIN SOD (PORK) LOCK FLUSH 100 UNIT/ML IV SOLN: 500.0000 [IU] | Freq: Once | INTRAVENOUS | Status: DC

## 2024-11-21 MED ORDER — FENTANYL CITRATE (PF) 100 MCG/2ML IJ SOLN
INTRAMUSCULAR | Status: AC | PRN
  Administered 2024-11-21 (×2): 50 ug via INTRAVENOUS

## 2024-11-21 MED ORDER — MIDAZOLAM HCL (PF) 2 MG/2ML IJ SOLN
INTRAMUSCULAR | Status: AC | PRN
  Administered 2024-11-21 (×2): 1 mg via INTRAVENOUS

## 2024-11-21 MED ORDER — LIDOCAINE-EPINEPHRINE 1 %-1:100000 IJ SOLN
INTRAMUSCULAR | Status: AC
  Filled 2024-11-21: qty 20

## 2024-11-21 MED ORDER — LIDOCAINE HCL 1 % IJ SOLN
INTRAMUSCULAR | Status: AC
  Filled 2024-11-21: qty 20

## 2024-11-21 MED ORDER — HEPARIN SOD (PORK) LOCK FLUSH 100 UNIT/ML IV SOLN
INTRAVENOUS | Status: AC | PRN
  Administered 2024-11-21: 500 [IU] via INTRAVENOUS

## 2024-11-21 MED ORDER — SODIUM CHLORIDE 0.9 % IV SOLN: INTRAVENOUS | Status: DC

## 2024-11-21 MED ORDER — MIDAZOLAM HCL 2 MG/2ML IJ SOLN
INTRAMUSCULAR | Status: AC
  Filled 2024-11-21: qty 2

## 2024-11-21 MED ORDER — HEPARIN SOD (PORK) LOCK FLUSH 100 UNIT/ML IV SOLN
INTRAVENOUS | Status: AC
  Filled 2024-11-21: qty 5

## 2024-11-21 MED ORDER — LIDOCAINE-EPINEPHRINE 1 %-1:100000 IJ SOLN
20.0000 mL | Freq: Once | INTRAMUSCULAR | Status: AC
  Administered 2024-11-21: 10 mL via INTRADERMAL

## 2024-11-21 MED ORDER — LIDOCAINE HCL 1 % IJ SOLN
20.0000 mL | Freq: Once | INTRAMUSCULAR | Status: AC
  Administered 2024-11-21: 9 mL via INTRADERMAL

## 2024-11-21 MED ORDER — FENTANYL CITRATE (PF) 100 MCG/2ML IJ SOLN
INTRAMUSCULAR | Status: AC
  Filled 2024-11-21: qty 2

## 2024-11-21 NOTE — Discharge Instructions (Signed)
 Discharge Instructions:   Please call Interventional Radiology clinic [phone] with any questions or concerns.  You may remove your dressing and shower tomorrow.  Do not use EMLA  / Lidocaine  cream for 2 weeks post Port Insertion this will remove the surgical glue from the incision.   Moderate Conscious Sedation, Adult, Care After  This sheet gives you information about how to care for yourself after your procedure. Your health care provider may also give you more specific instructions. If you have problems or questions, contact your health care provider. What can I expect after the procedure? After the procedure, it is common to have: Sleepiness for several hours. Impaired judgment for several hours. Difficulty with balance. Vomiting if you eat too soon. Follow these instructions at home: For the time period you were told by your health care provider: Rest. Do not participate in activities where you could fall or become injured. Do not drive or use machinery. Do not drink alcohol. Do not take sleeping pills or medicines that cause drowsiness. Do not make important decisions or sign legal documents. Do not take care of children on your own. Eating and drinking  Follow the diet recommended by your health care provider. Drink enough fluid to keep your urine pale yellow. If you vomit: Drink water , juice, or soup when you can drink without vomiting. Make sure you have little or no nausea before eating solid foods. General instructions Take over-the-counter and prescription medicines only as told by your health care provider. Have a responsible adult stay with you for the time you are told. It is important to have someone help care for you until you are awake and alert. Do not smoke. Keep all follow-up visits as told by your health care provider. This is important. Contact a health care provider if: You are still sleepy or having trouble with balance after 24 hours. You feel  light-headed. You keep feeling nauseous or you keep vomiting. You develop a rash. You have a fever. You have redness or swelling around the IV site. Get help right away if: You have trouble breathing. You have new-onset confusion at home. Summary After the procedure, it is common to feel sleepy, have impaired judgment, or feel nauseous if you eat too soon. Rest after you get home. Know the things you should not do after the procedure. Follow the diet recommended by your health care provider and drink enough fluid to keep your urine pale yellow. Get help right away if you have trouble breathing or new-onset confusion at home. This information is not intended to replace advice given to you by your health care provider. Make sure you discuss any questions you have with your health care provider. Document Revised: 02/02/2020 Document Reviewed: 08/31/2019 Elsevier Patient Education  2023 [employer]    Implanted [place] Insertion, Care After  The following information offers guidance on how to care for yourself after your procedure. Your health care provider may also give you more specific instructions. If you have problems or questions, contact your health care provider. What can I expect after the procedure? After the procedure, it is common to have: Discomfort at the port insertion site. Bruising on the skin over the port. This should improve over 3-4 days. Follow these instructions at home: [facility] care After your port is placed, you will get a manufacturer's information card. The card has information about your port. Keep this card with you at all times. Take care of the port as told by your health care provider.  Ask your health care provider if you or a family member can get training for taking care of the port at home. A home health care nurse will be be available to help care for the port. Make sure to remember what type of port you have. Incision care     Follow instructions from  your health care provider about how to take care of your port insertion site. Make sure you: Wash your hands with soap and water  for at least 20 seconds before and after you change your bandage (dressing). If soap and water  are not available, use hand sanitizer. Change your dressing as told by your health care provider. Leave stitches (sutures), skin glue, or adhesive strips in place. These skin closures may need to stay in place for 2 weeks or longer. If adhesive strip edges start to loosen and curl up, you may trim the loose edges. Do not remove adhesive strips completely unless your health care provider tells you to do that. Check your port insertion site every day for signs of infection. Check for: Redness, swelling, or pain. Fluid or blood. Warmth. Pus or a bad smell. Activity Return to your normal activities as told by your health care provider. Ask your health care provider what activities are safe for you. You may have to avoid lifting. Ask your health care provider how much you can safely lift. General instructions Take over-the-counter and prescription medicines only as told by your health care provider. Do not take baths, swim, or use a hot tub until your health care provider approves. Ask your health care provider if you may take showers. You may only be allowed to take sponge baths. If you were given a sedative during the procedure, it can affect you for several hours. Do not drive or operate machinery until your health care provider says that it is safe. Wear a medical alert bracelet in case of an emergency. This will tell any health care providers that you have a port. Keep all follow-up visits. This is important. Contact a health care provider if: You cannot flush your port with saline as directed, or you cannot draw blood from the port. You have a fever or chills. You have redness, swelling, or pain around your port insertion site. You have fluid or blood coming from your port  insertion site. Your port insertion site feels warm to the touch. You have pus or a bad smell coming from the port insertion site. Get help right away if: You have chest pain or shortness of breath. You have bleeding from your port that you cannot control. These symptoms may be an emergency. Get help right away. Call 911. Do not wait to see if the symptoms will go away. Do not drive yourself to the hospital. Summary Take care of the port as told by your health care provider. Keep the manufacturer's information card with you at all times. Change your dressing as told by your health care provider. Contact a health care provider if you have a fever or chills or if you have redness, swelling, or pain around your port insertion site. Keep all follow-up visits. This information is not intended to replace advice given to you by your health care provider. Make sure you discuss any questions you have with your health care provider. Document Revised: 04/08/2021 Document Reviewed: 04/08/2021 Elsevier Patient Education  2023 [employer].

## 2024-11-21 NOTE — Procedures (Signed)
Interventional Radiology Procedure:   Indications: Metastatic lung cancer  Procedure: Port placement  Findings: Left jugular port, tip at SVC/RA junction  Complications: None     EBL: Minimal, less than 10 ml  Plan: Discharge in one hour.  Keep port site and incisions dry for at least 24 hours.     Marnesha Gagen R. Anselm Pancoast, MD  Pager: [phone]

## 2024-11-21 NOTE — Telephone Encounter (Signed)
 Copied from CRM #[id number]. Topic: Appointments - Appointment Scheduling >> Nov 20, 2024  4:03 PM Chantha C wrote: Patient/patient representative is calling to schedule an appointment. Refer to attachments for appointment information.  Patient [phone] states is schedule for 11/21/24 7:30 am for port placement and PFT is at 4 pm, does patient need to reschedule? Please advise from [place] per patient and call back. >> Nov 20, 2024  4:27 PM Rozanna G wrote: Pt stated she is having a port placed tomorrow and wanted to see if she needs to have the PFT done. Advised her that we ask to resch 24 hours resch/cancel her appt with us , she insisted to get advise from the nurse here. She would like for the nurse to call this number and leave message with Rosealee [phone], she will be available while she is having her port done. Thanks    Secure chat sent to Dr. Pleas to address concerns.

## 2024-11-21 NOTE — Progress Notes (Signed)
 1115 Ice bag given to use prn to left upper neck and left upper chest as instructed for comfort.

## 2024-11-22 ENCOUNTER — Inpatient Hospital Stay: Attending: Hematology and Oncology

## 2024-11-22 DIAGNOSIS — C3491 Malignant neoplasm of unspecified part of right bronchus or lung: Secondary | ICD-10-CM

## 2024-11-23 ENCOUNTER — Telehealth: Payer: Self-pay | Admitting: Licensed Clinical Social Worker

## 2024-11-23 ENCOUNTER — Encounter: Payer: Self-pay | Admitting: Hematology and Oncology

## 2024-11-23 ENCOUNTER — Other Ambulatory Visit: Payer: Self-pay | Admitting: Internal Medicine

## 2024-11-23 ENCOUNTER — Inpatient Hospital Stay

## 2024-11-23 ENCOUNTER — Encounter: Payer: Self-pay | Admitting: Internal Medicine

## 2024-11-23 DIAGNOSIS — C3490 Malignant neoplasm of unspecified part of unspecified bronchus or lung: Secondary | ICD-10-CM

## 2024-11-23 NOTE — Telephone Encounter (Signed)
 CSW contacted pt to check in. Pt requesting a call 2/10 following her first chemo treatment.

## 2024-11-24 ENCOUNTER — Encounter: Payer: Self-pay | Admitting: General Practice

## 2024-11-24 ENCOUNTER — Other Ambulatory Visit: Payer: Self-pay | Admitting: Physician Assistant

## 2024-11-24 ENCOUNTER — Inpatient Hospital Stay

## 2024-11-24 ENCOUNTER — Telehealth: Payer: Self-pay

## 2024-11-24 VITALS — BP 145/62 | HR 80 | Temp 98.0°F | Resp 16 | Wt 121.8 lb

## 2024-11-24 DIAGNOSIS — C3490 Malignant neoplasm of unspecified part of unspecified bronchus or lung: Secondary | ICD-10-CM

## 2024-11-24 LAB — CBC WITH DIFFERENTIAL (CANCER CENTER ONLY)
Abs Immature Granulocytes: 0 10*3/uL (ref 0.00–0.07)
Basophils Absolute: 0 10*3/uL (ref 0.0–0.1)
Basophils Relative: 1 %
Eosinophils Absolute: 0.1 10*3/uL (ref 0.0–0.5)
Eosinophils Relative: 1 %
HCT: 31.9 % — ABNORMAL LOW (ref 36.0–46.0)
Hemoglobin: 10.4 g/dL — ABNORMAL LOW (ref 12.0–15.0)
Immature Granulocytes: 0 %
Lymphocytes Relative: 21 %
Lymphs Abs: 0.9 10*3/uL (ref 0.7–4.0)
MCH: 27.2 pg (ref 26.0–34.0)
MCHC: 32.6 g/dL (ref 30.0–36.0)
MCV: 83.5 fL (ref 80.0–100.0)
Monocytes Absolute: 0.5 10*3/uL (ref 0.1–1.0)
Monocytes Relative: 13 %
Neutro Abs: 2.6 10*3/uL (ref 1.7–7.7)
Neutrophils Relative %: 64 %
Platelet Count: 178 10*3/uL (ref 150–400)
RBC: 3.82 MIL/uL — ABNORMAL LOW (ref 3.87–5.11)
RDW: 16.1 % — ABNORMAL HIGH (ref 11.5–15.5)
WBC Count: 4 10*3/uL (ref 4.0–10.5)
nRBC: 0 % (ref 0.0–0.2)

## 2024-11-24 LAB — CMP (CANCER CENTER ONLY)
ALT: 13 U/L (ref 0–44)
AST: 20 U/L (ref 15–41)
Albumin: 4.4 g/dL (ref 3.5–5.0)
Alkaline Phosphatase: 54 U/L (ref 38–126)
Anion gap: 9 (ref 5–15)
BUN: 17 mg/dL (ref 8–23)
CO2: 27 mmol/L (ref 22–32)
Calcium: 9.8 mg/dL (ref 8.9–10.3)
Chloride: 103 mmol/L (ref 98–111)
Creatinine: 0.74 mg/dL (ref 0.44–1.00)
GFR, Estimated: >60 mL/min
Glucose, Bld: 114 mg/dL — ABNORMAL HIGH (ref 70–99)
Potassium: 4.4 mmol/L (ref 3.5–5.1)
Sodium: 140 mmol/L (ref 135–145)
Total Bilirubin: <0.2 mg/dL (ref 0.0–1.2)
Total Protein: 6.8 g/dL (ref 6.5–8.1)

## 2024-11-24 LAB — T4, FREE: Free T4: 1.39 ng/dL (ref 0.80–2.00)

## 2024-11-24 LAB — TSH: TSH: 1.12 u[IU]/mL (ref 0.350–4.500)

## 2024-11-24 MED ORDER — METHYLPREDNISOLONE SODIUM SUCC 125 MG IJ SOLR
125.0000 mg | Freq: Once | INTRAMUSCULAR | Status: AC | PRN
  Administered 2024-11-24: 62.5 mg via INTRAVENOUS

## 2024-11-24 MED ORDER — SODIUM CHLORIDE 0.9 % IV SOLN
500.0000 mg/m2 | Freq: Once | INTRAVENOUS | Status: AC
  Administered 2024-11-24: 800 mg via INTRAVENOUS
  Filled 2024-11-24: qty 20

## 2024-11-24 MED ORDER — DIPHENHYDRAMINE HCL 50 MG/ML IJ SOLN
50.0000 mg | Freq: Once | INTRAMUSCULAR | Status: AC | PRN
  Administered 2024-11-24: 50 mg via INTRAVENOUS

## 2024-11-24 MED ORDER — DEXAMETHASONE 4 MG PO TABS: ORAL_TABLET | ORAL | 0 refills | Status: AC

## 2024-11-24 MED ORDER — DEXAMETHASONE SOD PHOSPHATE PF 10 MG/ML IJ SOLN
10.0000 mg | Freq: Once | INTRAMUSCULAR | Status: AC
  Administered 2024-11-24: 10 mg via INTRAVENOUS
  Filled 2024-11-24: qty 1

## 2024-11-24 MED ORDER — FAMOTIDINE IN NACL 20-0.9 MG/50ML-% IV SOLN
20.0000 mg | Freq: Once | INTRAVENOUS | Status: AC | PRN
  Administered 2024-11-24: 20 mg via INTRAVENOUS

## 2024-11-24 MED ORDER — PALONOSETRON HCL INJECTION 0.25 MG/5ML
0.2500 mg | Freq: Once | INTRAVENOUS | Status: AC
  Administered 2024-11-24: 0.25 mg via INTRAVENOUS
  Filled 2024-11-24: qty 5

## 2024-11-24 MED ORDER — SODIUM CHLORIDE 0.9 % IV SOLN
332.0000 mg | Freq: Once | INTRAVENOUS | Status: AC
  Administered 2024-11-24: 330 mg via INTRAVENOUS
  Filled 2024-11-24: qty 33

## 2024-11-24 MED ORDER — SODIUM CHLORIDE 0.9 % IV SOLN: Freq: Once | INTRAVENOUS | Status: DC | PRN

## 2024-11-24 MED ORDER — SODIUM CHLORIDE 0.9 % IV SOLN
350.0000 mg | Freq: Once | INTRAVENOUS | Status: AC
  Administered 2024-11-24: 350 mg via INTRAVENOUS
  Filled 2024-11-24: qty 7

## 2024-11-24 MED ORDER — APREPITANT 130 MG/18ML IV EMUL
130.0000 mg | Freq: Once | INTRAVENOUS | Status: AC
  Administered 2024-11-24: 130 mg via INTRAVENOUS
  Filled 2024-11-24: qty 18

## 2024-11-24 MED ORDER — SODIUM CHLORIDE 0.9 % IV SOLN: INTRAVENOUS | Status: DC

## 2024-11-24 NOTE — Telephone Encounter (Signed)
 Cat, Pt's child, spoke with nurse while pt was in infusion as there have been noticeable chances at home in pt's behavior.  Cat does not want to embarrass the pt by talking about it in her presence  yet wants Dr Buckley to know these changes.  Behavior changes noticed are increased irritability with expressions of paranoia which she has never had before.She now suddenly believes family members  are going to hurt her. This expression of paranoia started this week. She also reaches for the right words yet it comes out in gibberish. Cat stated the family would write down any other changes and bring to the pt's next appointment with Dr Buckley 12/05/24.  Cat stated the family would reach out to resources for care giver counseling in [place] where they reside. Dr Buckley made aware.

## 2024-11-24 NOTE — Patient Instructions (Signed)

## 2024-11-24 NOTE — Progress Notes (Signed)
 Okay to proceed with treatment today while shara is pending per Darlena.  Harlene Nasuti, PharmD Oncology Infusion Pharmacist 11/24/2024 3:01 PM

## 2024-11-24 NOTE — Progress Notes (Signed)
 I assessed Ms. Drumheller after she developed diffuse itching and hives on her face and neck shortly after initiating her Alimta  infusion. Iv fluids were stable and emergency medications including pepcid  20 mg, benadryl  50 mg and solumedrol 62.5 mg were given. Itching did improve but she has persistent hives on her face/neck. Vitals were stable. Due to persistent hives on face, decision was made to discontinue Alimta  for today's treatment and proceed with carboplatin  that is scheduled for today. I will give dexamethasone  taper for next 3 days incase patient has any residual symptoms.   Dr. Sherrod is aware of plan and will discuss re-challenging Alimta  with additional pre-meds at next treatment check.

## 2024-11-24 NOTE — Progress Notes (Signed)
 Medication noted to be given out of order by this RN. Pt was given Libtayo  first when treatment plan denoted it going last. RPH Leotis Ferries was contacted and notified, told this RN to finish Libtayo  infusion and then give Alimta  and carboplatin .

## 2024-11-24 NOTE — Progress Notes (Signed)
 SPIRITUAL CARE AND COUNSELING CONSULT NOTE   VISIT SUMMARY Referred by Infusion Nursing for additional layer of support. Introduced Spiritual Care as part of [facility] Patient and Family Support Team, bringing chaplain's brochure and handmade blessing quilt as gesture of care and encouragement.   SPIRITUAL ENCOUNTER                                                                                                                                                                      Type of Visit: Initial Care provided to:: Pt and family (Daughter Cat at chairside for beginning of conversation) Referral source: Nurse (RN/NT/LPN) Reason for visit: Urgent spiritual support  SPIRITUAL FRAMEWORK  Presenting Themes: Meaning/purpose/sources of inspiration, Goals in life/care, Values and beliefs, Significant life change, Caregiving needs, Courage hope and growth, Community and relationships Values/beliefs: Emily Hayes is helping to provide peace; quality of life is top priority Community/Connection: Family, Friend(s) Transport Planner; dog) Strengths: Self-aware, recruitment consultant, engaged in meaning-making Needs/Challenges/Barriers: Concerned about whether treatment side effects will compromise quality of life Patient Stress Factors: Health changes, Exhausted   GOALS   Self/Personal Goals: Take whatever self-care steps promote quality of life Clinical Care Goals: Provide ongoing availability for support through treatment/discernment process   INTERVENTIONS   Spiritual Care Interventions Made: Established relationship of care and support, Compassionate presence, Reflective listening, Normalization of emotions, Narrative/life review, Explored values/beliefs/practices/strengths, Meaning making, Encouragement   INTERVENTION OUTCOMES   Outcomes: Connection to spiritual care, Awareness around self/spiritual resourses, Connection to values and goals of care, Autonomy/agency, Awareness of support  SPIRITUAL CARE PLAN    Spiritual Care Issues Still Outstanding: Chaplain will continue to follow Follow up plan : Patient plans to phone chaplain when she is feeling well enough after treatment    Chaplain Olam Filiberto Lemming, [facility] Pager [phone] Voicemail [phone]

## 2024-11-24 NOTE — Progress Notes (Signed)
 Hypersensitivity Reaction Note  Date of event: 11/24/24  Time of event: 1640   Type of event: Grade 2 (Moderate reaction; Requires therapy or infusion interruption, but responds promptly to interventions; prophylactic medications indicated for <24 hours)    Generic name of drug involved: Other (pemetrexed )  Initial Presentation and Response:  The patient reported and showed signs of hives and itching, and anxiety during the infusion.   Provider notified of the hypersensitivity reaction: Johnston Police, PA-C  Time of provider notification: [phone]  Initial Interventions Implemented:   Infusion stopped: Yes  Medications administered - see MAR for sequence and times of administration: Normal Saline 1 L IV, Diphenhydramine  (Benadryl ) 50 mg IV, Famotidine  (Pepcid ) 20 mg IV, and Methylprednisolone  (Solu-Medrol ) 62.5 mg IV  Additional interventions:   Patient response to treatment: Symptoms diminished following interventions.  Remaining Course of Treatment:    The patient was unable to complete the infusion.  Was agent that likely caused hypersensitivity reaction added to Allergies List within EMR? Yes   Additional Information Regarding the Chain of Events (including reaction signs/symptoms, treatment administered, and outcome):

## 2024-11-24 NOTE — Patient Instructions (Addendum)
 [facility] - A DEPT OF [facility]  Discharge Instructions: Thank you for choosing [facility] to provide your oncology and hematology care.   If you have a lab appointment with the Cancer Center, please go directly to the Cancer Center and check in at the registration area.   Wear comfortable clothing and clothing appropriate for easy access to any Portacath or PICC line.   We strive to give you quality time with your provider. You may need to reschedule your appointment if you arrive late (15 or more minutes).  Arriving late affects you and other patients whose appointments are after yours.  Also, if you miss three or more appointments without notifying the office, you may be dismissed from the clinic at the providers discretion.      For prescription refill requests, have your pharmacy contact our office and allow 72 hours for refills to be completed.    Today you received the following chemotherapy and/or immunotherapy agents: Cemiplimab -rwlc (Libtayo ), Pemetrexed  (Keytruda), and Carboplatin  (Paraplatin )   To help prevent nausea and vomiting after your treatment, we encourage you to take your nausea medication as directed.  BELOW ARE SYMPTOMS THAT SHOULD BE REPORTED IMMEDIATELY: *FEVER GREATER THAN 100.4 F (38 C) OR HIGHER *CHILLS OR SWEATING *NAUSEA AND VOMITING THAT IS NOT CONTROLLED WITH YOUR NAUSEA MEDICATION *UNUSUAL SHORTNESS OF BREATH *UNUSUAL BRUISING OR BLEEDING *URINARY PROBLEMS (pain or burning when urinating, or frequent urination) *BOWEL PROBLEMS (unusual diarrhea, constipation, pain near the anus) TENDERNESS IN MOUTH AND THROAT WITH OR WITHOUT PRESENCE OF ULCERS (sore throat, sores in mouth, or a toothache) UNUSUAL RASH, SWELLING OR PAIN  UNUSUAL VAGINAL DISCHARGE OR ITCHING   Items with * indicate a potential emergency and should be followed up as soon as possible or go to the Emergency Department if any problems should  occur.  Please show the CHEMOTHERAPY ALERT CARD or IMMUNOTHERAPY ALERT CARD at check-in to the Emergency Department and triage nurse.  Should you have questions after your visit or need to cancel or reschedule your appointment, please contact [facility] - A DEPT OF [facility]  Dept: [phone]  and follow the prompts.  Office hours are 8:00 a.m. to 4:30 p.m. Monday - Friday. Please note that voicemails left after 4:00 p.m. may not be returned until the following business day.  We are closed weekends and major holidays. You have access to a nurse at all times for urgent questions. Please call the main number to the clinic Dept: [phone] and follow the prompts.   For any non-urgent questions, you may also contact your provider using MyChart. We now offer e-Visits for anyone 78 and older to request care online for non-urgent symptoms. For details visit [email or web address].   Also download the MyChart app! Go to the app store, search MyChart, open the app, select [place] [facility], and log in with your MyChart username and password.  Cemiplimab  Injection What is this medication? CEMIPLIMAB  (se MIP li mab) treats skin cancer and lung cancer. It works by helping your immune system slow or stop the spread of cancer cells. It is a monoclonal antibody. This medicine may be used for other purposes; ask your health care provider or pharmacist if you have questions. COMMON BRAND NAME(S): LIBTAYO  What should I tell my care team before I take this medication? They need to know if you have any of these conditions: Autoimmune conditions, such as Crohn disease,  ulcerative colitis, lupus Have had radiation therapy to your chest area Have received or plan to receive a stem cell transplant that uses donor stem cells (allogeneic) Nervous system problems, such as Guillain-Barre syndrome or myasthenia gravis Organ or tissue transplant An unusual or allergic reaction to  cemiplimab , other medications, foods, dyes, or preservatives Pregnant or trying to get pregnant Breastfeeding How should I use this medication? This medication is infused into a vein. It is given by your care team in a hospital or clinic setting. A special MedGuide will be given to you before each treatment. Be sure to read this information carefully each time. Talk to your care team about the use of this medication in children. Special care may be needed. Overdosage: If you think you have taken too much of this medicine contact a poison control center or emergency room at once. NOTE: This medicine is only for you. Do not share this medicine with others. What if I miss a dose? Keep appointments for follow-up doses. It is important not to miss your dose. Call your care team if you are unable to keep an appointment. What may interact with this medication? Interactions have not been studied. This list may not describe all possible interactions. Give your health care provider a list of all the medicines, herbs, non-prescription drugs, or dietary supplements you use. Also tell them if you smoke, drink alcohol, or use illegal drugs. Some items may interact with your medicine. What should I watch for while using this medication? Visit your care team for regular checks on your progress. It may be some time before you see the benefit from this medication. You may need blood work done while taking this medication. This medication may cause serious skin reactions. They can happen weeks to months after starting the medication. Contact your care team right away if you notice fevers or flu-like symptoms with a rash. The rash may be red or purple and then turn into blisters or peeling of the skin. You may also notice a red rash with swelling of the face, lips, or lymph nodes in your neck or under your arms. Tell your care team right away if you have any change in your eyesight. Talk to your care team if you may  be pregnant. You will need a negative pregnancy test before starting this medication. Contraception is recommended while taking this medication and for 4 months after the last dose. Your care team can help you find the option that works for you. Do not breastfeed while taking this medication and for at least 4 months after the last dose. What side effects may I notice from receiving this medication? Side effects that you should report to your care team as soon as possible: Allergic reactions--skin rash, itching, hives, swelling of the face, lips, tongue, or throat Dry cough, shortness of breath or trouble breathing Eye pain, redness, irritation, or discharge with blurry or decreased vision Heart muscle inflammation--unusual weakness or fatigue, shortness of breath, chest pain, fast or irregular heartbeat, dizziness, swelling of the ankles, feet, or hands Hormone gland problems--headache, sensitivity to light, unusual weakness or fatigue, dizziness, fast or irregular heartbeat, increased sensitivity to cold or heat, excessive sweating, constipation, hair loss, increased thirst or amount of urine, tremors or shaking, irritability Infusion reactions--chest pain, shortness of breath or trouble breathing, feeling faint or lightheaded Kidney injury (glomerulonephritis)--decrease in the amount of urine, red or dark brown urine, foamy or bubbly urine, swelling of the ankles, hands, or feet Liver injury--right  upper belly pain, loss of appetite, nausea, light-colored stool, dark yellow or brown urine, yellowing skin or eyes, unusual weakness or fatigue Pain, tingling, or numbness in the hands or feet, muscle weakness, change in vision, confusion or trouble speaking, loss of balance or coordination, trouble walking, seizures Rash, fever, and swollen lymph nodes Redness, blistering, peeling, or loosening of the skin, including inside the mouth Sudden or severe stomach pain, bloody diarrhea, fever, nausea,  vomiting Side effects that usually do not require medical attention (report these to your care team if they continue or are bothersome): Bone, joint, or muscle pain Diarrhea Fatigue Loss of appetite Nausea Skin rash This list may not describe all possible side effects. Call your doctor for medical advice about side effects. You may report side effects to FDA at 1-800-FDA-1088. Where should I keep my medication? This medication is given in a hospital or clinic and will not be stored at home. NOTE: This sheet is a summary. It may not cover all possible information. If you have questions about this medicine, talk to your doctor, pharmacist, or health care provider.  2025 Elsevier/Gold Standard (2024-08-11 00:00:00)  Pemetrexed  Injection What is this medication? PEMETREXED  (PEM e TREX ed) treats some types of cancer. It works by slowing down the growth of cancer cells. This medicine may be used for other purposes; ask your health care provider or pharmacist if you have questions. COMMON BRAND NAME(S): Alimta , PEMFEXY, PEMRYDI RTU What should I tell my care team before I take this medication? They need to know if you have any of these conditions: Infection, such as chickenpox, cold sores, or herpes Kidney disease Low blood cell levels (white cells, red cells, and platelets) Lung or breathing disease, such as asthma Radiation therapy An unusual or allergic reaction to pemetrexed , other medications, foods, dyes, or preservatives If you or your partner are pregnant or trying to get pregnant Breast-feeding How should I use this medication? This medication is injected into a vein. It is given by your care team in a hospital or clinic setting. Talk to your care team about the use of this medication in children. Special care may be needed. Overdosage: If you think you have taken too much of this medicine contact a poison control center or emergency room at once. NOTE: This medicine is only for  you. Do not share this medicine with others. What if I miss a dose? Keep appointments for follow-up doses. It is important not to miss your dose. Call your care team if you are unable to keep an appointment. What may interact with this medication? Do not take this medication with any of the following: Live virus vaccines This medication may also interact with the following: Ibuprofen This list may not describe all possible interactions. Give your health care provider a list of all the medicines, herbs, non-prescription drugs, or dietary supplements you use. Also tell them if you smoke, drink alcohol, or use illegal drugs. Some items may interact with your medicine. What should I watch for while using this medication? Your condition will be monitored carefully while you are receiving this medication. This medication may make you feel generally unwell. This is not uncommon as chemotherapy can affect healthy cells as well as cancer cells. Report any side effects. Continue your course of treatment even though you feel ill unless your care team tells you to stop. This medication can cause serious side effects. To reduce the risk, your care team may give you other medications to take before  receiving this one. Be sure to follow the directions from your care team. This medication can cause a rash or redness in areas of the body that have previously had radiation therapy. If you have had radiation therapy, tell your care team if you notice a rash in this area. This medication may increase your risk of getting an infection. Call your care team for advice if you get a fever, chills, sore throat, or other symptoms of a cold or flu. Do not treat yourself. Try to avoid being around people who are sick. Be careful brushing or flossing your teeth or using a toothpick because you may get an infection or bleed more easily. If you have any dental work done, tell your dentist you are receiving this medication. Avoid  taking medications that contain aspirin , acetaminophen , ibuprofen, naproxen, or ketoprofen unless instructed by your care team. These medications may hide a fever. Check with your care team if you have severe diarrhea, nausea, and vomiting, or if you sweat a lot. The loss of too much body fluid may make it dangerous for you to take this medication. Talk to your care team if you or your partner wish to become pregnant or think either of you might be pregnant. This medication can cause serious birth defects if taken during pregnancy and for 6 months after the last dose. A negative pregnancy test is required before starting this medication. A reliable form of contraception is recommended while taking this medication and for 6 months after the last dose. Talk to your care team about reliable forms of contraception. Do not father a child while taking this medication and for 3 months after the last dose. Use a condom while having sex during this time period. Do not breastfeed while taking this medication and for 1 week after the last dose. This medication may cause infertility. Talk to your care team if you are concerned about your fertility. What side effects may I notice from receiving this medication? Side effects that you should report to your care team as soon as possible: Allergic reactions--skin rash, itching, hives, swelling of the face, lips, tongue, or throat Dry cough, shortness of breath or trouble breathing Infection--fever, chills, cough, sore throat, wounds that don't heal, pain or trouble when passing urine, general feeling of discomfort or being unwell Kidney injury--decrease in the amount of urine, swelling of the ankles, hands, or feet Low red blood cell level--unusual weakness or fatigue, dizziness, headache, trouble breathing Redness, blistering, peeling, or loosening of the skin, including inside the mouth Unusual bruising or bleeding Side effects that usually do not require medical  attention (report to your care team if they continue or are bothersome): Fatigue Loss of appetite Nausea Vomiting This list may not describe all possible side effects. Call your doctor for medical advice about side effects. You may report side effects to FDA at 1-800-FDA-1088. Where should I keep my medication? This medication is given in a hospital or clinic. It will not be stored at home. NOTE: This sheet is a summary. It may not cover all possible information. If you have questions about this medicine, talk to your doctor, pharmacist, or health care provider.  2024 Elsevier/Gold Standard (2022-02-10 00:00:00)  Carboplatin  Injection What is this medication? CARBOPLATIN  (KAR boe pla tin) treats some types of cancer. It works by slowing down the growth of cancer cells. This medicine may be used for other purposes; ask your health care provider or pharmacist if you have questions. COMMON BRAND NAME(S): Paraplatin   What should I tell my care team before I take this medication? They need to know if you have any of these conditions: Blood disorders Hearing problems Kidney disease Recent or ongoing radiation therapy An unusual or allergic reaction to carboplatin , cisplatin, other medications, foods, dyes, or preservatives Pregnant or trying to get pregnant Breast-feeding How should I use this medication? This medication is injected into a vein. It is given by your care team in a hospital or clinic setting. Talk to your care team about the use of this medication in children. Special care may be needed. Overdosage: If you think you have taken too much of this medicine contact a poison control center or emergency room at once. NOTE: This medicine is only for you. Do not share this medicine with others. What if I miss a dose? Keep appointments for follow-up doses. It is important not to miss your dose. Call your care team if you are unable to keep an appointment. What may interact with this  medication? Medications for seizures Some antibiotics, such as amikacin, gentamicin, neomycin, streptomycin, tobramycin Vaccines This list may not describe all possible interactions. Give your health care provider a list of all the medicines, herbs, non-prescription drugs, or dietary supplements you use. Also tell them if you smoke, drink alcohol, or use illegal drugs. Some items may interact with your medicine. What should I watch for while using this medication? Your condition will be monitored carefully while you are receiving this medication. You may need blood work while taking this medication. This medication may make you feel generally unwell. This is not uncommon, as chemotherapy can affect healthy cells as well as cancer cells. Report any side effects. Continue your course of treatment even though you feel ill unless your care team tells you to stop. In some cases, you may be given additional medications to help with side effects. Follow all directions for their use. This medication may increase your risk of getting an infection. Call your care team for advice if you get a fever, chills, sore throat, or other symptoms of a cold or flu. Do not treat yourself. Try to avoid being around people who are sick. Avoid taking medications that contain aspirin , acetaminophen , ibuprofen, naproxen, or ketoprofen unless instructed by your care team. These medications may hide a fever. Be careful brushing or flossing your teeth or using a toothpick because you may get an infection or bleed more easily. If you have any dental work done, tell your dentist you are receiving this medication. Talk to your care team if you wish to become pregnant or think you might be pregnant. This medication can cause serious birth defects. Talk to your care team about effective forms of contraception. Do not breast-feed while taking this medication. What side effects may I notice from receiving this medication? Side effects  that you should report to your care team as soon as possible: Allergic reactions--skin rash, itching, hives, swelling of the face, lips, tongue, or throat Infection--fever, chills, cough, sore throat, wounds that don't heal, pain or trouble when passing urine, general feeling of discomfort or being unwell Low red blood cell level--unusual weakness or fatigue, dizziness, headache, trouble breathing Pain, tingling, or numbness in the hands or feet, muscle weakness, change in vision, confusion or trouble speaking, loss of balance or coordination, trouble walking, seizures Unusual bruising or bleeding Side effects that usually do not require medical attention (report to your care team if they continue or are bothersome): Hair loss Nausea Unusual weakness or  fatigue Vomiting This list may not describe all possible side effects. Call your doctor for medical advice about side effects. You may report side effects to FDA at 1-800-FDA-1088. Where should I keep my medication? This medication is given in a hospital or clinic. It will not be stored at home. NOTE: This sheet is a summary. It may not cover all possible information. If you have questions about this medicine, talk to your doctor, pharmacist, or health care provider.  2024 Elsevier/Gold Standard (2022-01-27 00:00:00)

## 2024-11-28 ENCOUNTER — Other Ambulatory Visit

## 2024-11-30 ENCOUNTER — Other Ambulatory Visit

## 2024-12-04 ENCOUNTER — Inpatient Hospital Stay

## 2024-12-05 ENCOUNTER — Inpatient Hospital Stay: Admitting: Internal Medicine

## 2024-12-08 ENCOUNTER — Ambulatory Visit: Admitting: Nurse Practitioner

## 2024-12-15 ENCOUNTER — Inpatient Hospital Stay

## 2024-12-15 ENCOUNTER — Inpatient Hospital Stay: Admitting: Physician Assistant

## 2025-01-04 ENCOUNTER — Inpatient Hospital Stay: Payer: Self-pay

## 2025-01-04 ENCOUNTER — Inpatient Hospital Stay: Payer: Self-pay | Attending: Hematology and Oncology

## 2025-01-04 ENCOUNTER — Inpatient Hospital Stay: Payer: Self-pay | Admitting: Internal Medicine

## 2025-01-25 ENCOUNTER — Inpatient Hospital Stay: Payer: Self-pay | Attending: Hematology and Oncology

## 2025-01-25 ENCOUNTER — Inpatient Hospital Stay: Payer: Self-pay | Admitting: Internal Medicine

## 2025-01-25 ENCOUNTER — Inpatient Hospital Stay: Payer: Self-pay

## 2025-02-28 ENCOUNTER — Encounter

## 2025-04-10 ENCOUNTER — Ambulatory Visit

## 2025-07-06 ENCOUNTER — Ambulatory Visit: Admitting: Nurse Practitioner
